# Patient Record
Sex: Female | Born: 1975 | Race: White | Hispanic: No | Marital: Married | Smoking: Never smoker
Health system: Southern US, Community
[De-identification: ages and names within clinical notes are randomized; demographics above are authoritative.]

## PROBLEM LIST (undated history)

## (undated) DIAGNOSIS — E119 Type 2 diabetes mellitus without complications: Secondary | ICD-10-CM

## (undated) DIAGNOSIS — N979 Female infertility, unspecified: Secondary | ICD-10-CM

## (undated) DIAGNOSIS — K219 Gastro-esophageal reflux disease without esophagitis: Secondary | ICD-10-CM

## (undated) DIAGNOSIS — K589 Irritable bowel syndrome without diarrhea: Secondary | ICD-10-CM

## (undated) DIAGNOSIS — F32A Depression, unspecified: Secondary | ICD-10-CM

## (undated) DIAGNOSIS — E7800 Pure hypercholesterolemia, unspecified: Secondary | ICD-10-CM

## (undated) DIAGNOSIS — K921 Melena: Secondary | ICD-10-CM

## (undated) DIAGNOSIS — K635 Polyp of colon: Secondary | ICD-10-CM

## (undated) DIAGNOSIS — F329 Major depressive disorder, single episode, unspecified: Secondary | ICD-10-CM

## (undated) DIAGNOSIS — K8020 Calculus of gallbladder without cholecystitis without obstruction: Secondary | ICD-10-CM

## (undated) DIAGNOSIS — F419 Anxiety disorder, unspecified: Secondary | ICD-10-CM

## (undated) DIAGNOSIS — E079 Disorder of thyroid, unspecified: Secondary | ICD-10-CM

## (undated) DIAGNOSIS — E669 Obesity, unspecified: Secondary | ICD-10-CM

## (undated) HISTORY — PX: CYST EXCISION: SHX5701

## (undated) HISTORY — DX: Gastro-esophageal reflux disease without esophagitis: K21.9

## (undated) HISTORY — DX: Melena: K92.1

## (undated) HISTORY — DX: Pure hypercholesterolemia, unspecified: E78.00

## (undated) HISTORY — DX: Obesity, unspecified: E66.9

## (undated) HISTORY — DX: Irritable bowel syndrome, unspecified: K58.9

## (undated) HISTORY — DX: Female infertility, unspecified: N97.9

## (undated) HISTORY — DX: Polyp of colon: K63.5

## (undated) HISTORY — DX: Calculus of gallbladder without cholecystitis without obstruction: K80.20

## (undated) HISTORY — DX: Anxiety disorder, unspecified: F41.9

## (undated) HISTORY — PX: GLOMUS TUMOR EXCISION: SUR521

## (undated) HISTORY — DX: Type 2 diabetes mellitus without complications: E11.9

## (undated) HISTORY — DX: Depression, unspecified: F32.A

## (undated) HISTORY — DX: Major depressive disorder, single episode, unspecified: F32.9

---

## 2002-01-25 DIAGNOSIS — E079 Disorder of thyroid, unspecified: Secondary | ICD-10-CM

## 2002-01-25 HISTORY — DX: Disorder of thyroid, unspecified: E07.9

## 2003-01-26 ENCOUNTER — Emergency Department (HOSPITAL_COMMUNITY): Admission: AD | Admit: 2003-01-26 | Discharge: 2003-01-27 | Payer: Self-pay | Admitting: Emergency Medicine

## 2004-01-26 HISTORY — PX: CHOLECYSTECTOMY: SHX55

## 2006-05-12 ENCOUNTER — Ambulatory Visit (HOSPITAL_COMMUNITY): Admission: RE | Admit: 2006-05-12 | Discharge: 2006-05-12 | Payer: Self-pay | Admitting: Gynecology

## 2012-09-17 ENCOUNTER — Encounter (HOSPITAL_COMMUNITY): Payer: Self-pay | Admitting: Emergency Medicine

## 2012-09-17 ENCOUNTER — Emergency Department (INDEPENDENT_AMBULATORY_CARE_PROVIDER_SITE_OTHER)
Admission: EM | Admit: 2012-09-17 | Discharge: 2012-09-17 | Disposition: A | Discharge status: Home / Self Care | Payer: BC Managed Care – PPO | Source: Home / Self Care | Attending: Emergency Medicine | Admitting: Emergency Medicine

## 2012-09-17 DIAGNOSIS — J329 Chronic sinusitis, unspecified: Secondary | ICD-10-CM

## 2012-09-17 HISTORY — DX: Disorder of thyroid, unspecified: E07.9

## 2012-09-17 MED ORDER — AMOXICILLIN-POT CLAVULANATE 875-125 MG PO TABS: 1.0000 | ORAL_TABLET | Freq: Two times a day (BID) | ORAL | Status: DC

## 2012-09-17 MED ORDER — HYDROCODONE-ACETAMINOPHEN 5-325 MG PO TABS: 2.0000 | ORAL_TABLET | ORAL | Status: DC | PRN

## 2012-09-17 NOTE — ED Provider Notes (Signed)
Medical screening examination/treatment/procedure(s) were performed by non-physician practitioner and as supervising physician I was immediately available for consultation/collaboration.  Seymone Forlenza, M.D.  Jyair Kiraly C Victoria Euceda, MD 09/17/12 1439 

## 2012-09-17 NOTE — ED Provider Notes (Signed)
Medical screening examination/treatment/procedure(s) were performed by non-physician practitioner and as supervising physician I was immediately available for consultation/collaboration.  Shenee Wignall, M.D.  Namrata Dangler C Terron Merfeld, MD 09/17/12 1948 

## 2012-09-17 NOTE — ED Provider Notes (Addendum)
  CSN: [id number]     Arrival date & time 09/17/12  1237 History     First MD Initiated Contact with Patient 09/17/12 1434     Chief Complaint  Patient presents with  . URI   (Consider location/radiation/quality/duration/timing/severity/associated sxs/prior Treatment) Patient is a 37 y.o. female presenting with URI. The history is provided by the patient. No language interpreter was used.  URI Presenting symptoms: congestion, cough, ear pain, facial pain, fever, rhinorrhea and sore throat   Severity:  Moderate Onset quality:  Gradual Timing:  Constant Progression:  Worsening Chronicity:  New Relieved by:  Nothing Worsened by:  Nothing tried Ineffective treatments:  None tried Pt complains of a cough and congestion.  Pt complains of pain in right forehead  Past Medical History  Diagnosis Date  . Thyroid disease    Past Surgical History  Procedure Laterality Date  . Cholecystectomy    . Cyst excision     No family history on file. History  Substance Use Topics  . Smoking status: Never Smoker   . Smokeless tobacco: Not on file  . Alcohol Use: Yes   OB History   Grav Para Term Preterm Abortions TAB SAB Ect Mult Living                 Review of Systems  Constitutional: Positive for fever.  HENT: Positive for ear pain, congestion, sore throat and rhinorrhea.   Respiratory: Positive for cough.   All other systems reviewed and are negative.    Allergies  Review of patient's allergies indicates no known allergies.  Home Medications  No current outpatient prescriptions on file. BP 140/96  Pulse 96  Temp(Src) 98.8 F (37.1 C) (Oral)  Resp 18  SpO2 94%  LMP 09/10/2012 Physical Exam  Nursing note and vitals reviewed. Constitutional: She is oriented to person, place, and time. She appears well-developed and well-nourished.  HENT:  Head: Normocephalic and atraumatic.  Right Ear: External ear normal.  Left Ear: External ear normal.  Tender right frontal sinus   Eyes: Conjunctivae and EOM are normal. Pupils are equal, round, and reactive to light.  Neck: Normal range of motion. Neck supple.  Cardiovascular: Normal rate and normal heart sounds.   Pulmonary/Chest: Effort normal and breath sounds normal.  Abdominal: Soft.  Musculoskeletal: Normal range of motion.  Neurological: She is alert and oriented to person, place, and time. She has normal reflexes.  Skin: Skin is warm.  Psychiatric: She has a normal mood and affect.    ED Course   Procedures (including critical care time)  Labs Reviewed - No data to display No results found. 1. Sinusitis     MDM  Augmentin and Hydrocodone   Elson Areas, PA-C 09/17/12 1438  Lonia Skinner [place], [place] 09/17/12 1441

## 2012-09-17 NOTE — ED Notes (Signed)
Patient reports cold symptoms onset Thursday, but right side of head pain and neck pain and right ear pain onset yesterday.  Patient is tearful .

## 2012-09-20 ENCOUNTER — Encounter (HOSPITAL_COMMUNITY): Payer: Self-pay | Admitting: *Deleted

## 2012-09-20 ENCOUNTER — Emergency Department (HOSPITAL_COMMUNITY): Payer: BC Managed Care – PPO

## 2012-09-20 ENCOUNTER — Encounter (HOSPITAL_COMMUNITY): Payer: Self-pay | Admitting: Emergency Medicine

## 2012-09-20 ENCOUNTER — Emergency Department (HOSPITAL_COMMUNITY)
Admission: EM | Admit: 2012-09-20 | Discharge: 2012-09-20 | Disposition: A | Discharge status: Home / Self Care | Payer: BC Managed Care – PPO | Attending: Emergency Medicine | Admitting: Emergency Medicine

## 2012-09-20 ENCOUNTER — Emergency Department (INDEPENDENT_AMBULATORY_CARE_PROVIDER_SITE_OTHER)
Admission: EM | Admit: 2012-09-20 | Discharge: 2012-09-20 | Disposition: A | Discharge status: Nursing Home (Includes ICF) | Payer: BC Managed Care – PPO | Source: Home / Self Care

## 2012-09-20 DIAGNOSIS — R590 Localized enlarged lymph nodes: Secondary | ICD-10-CM

## 2012-09-20 DIAGNOSIS — R252 Cramp and spasm: Secondary | ICD-10-CM

## 2012-09-20 DIAGNOSIS — R110 Nausea: Secondary | ICD-10-CM | POA: Insufficient documentation

## 2012-09-20 DIAGNOSIS — J3489 Other specified disorders of nose and nasal sinuses: Secondary | ICD-10-CM | POA: Insufficient documentation

## 2012-09-20 DIAGNOSIS — R259 Unspecified abnormal involuntary movements: Secondary | ICD-10-CM

## 2012-09-20 DIAGNOSIS — H9209 Otalgia, unspecified ear: Secondary | ICD-10-CM | POA: Insufficient documentation

## 2012-09-20 DIAGNOSIS — M542 Cervicalgia: Secondary | ICD-10-CM | POA: Insufficient documentation

## 2012-09-20 DIAGNOSIS — R6884 Jaw pain: Secondary | ICD-10-CM | POA: Insufficient documentation

## 2012-09-20 DIAGNOSIS — J329 Chronic sinusitis, unspecified: Secondary | ICD-10-CM

## 2012-09-20 DIAGNOSIS — R599 Enlarged lymph nodes, unspecified: Secondary | ICD-10-CM | POA: Insufficient documentation

## 2012-09-20 DIAGNOSIS — R51 Headache: Secondary | ICD-10-CM | POA: Insufficient documentation

## 2012-09-20 LAB — POCT I-STAT, CHEM 8
BUN: 6 mg/dL (ref 6–23)
Calcium, Ion: 1.17 mmol/L (ref 1.12–1.23)
Chloride: 103 mEq/L (ref 96–112)
Creatinine, Ser: 0.8 mg/dL (ref 0.50–1.10)
Glucose, Bld: 115 mg/dL — ABNORMAL HIGH (ref 70–99)
HCT: 39 % (ref 36.0–46.0)
Hemoglobin: 13.3 g/dL (ref 12.0–15.0)
Potassium: 4 mEq/L (ref 3.5–5.1)
Sodium: 139 mEq/L (ref 135–145)
TCO2: 26 mmol/L (ref 0–100)

## 2012-09-20 LAB — CBC WITH DIFFERENTIAL/PLATELET
Basophils Absolute: 0 10*3/uL (ref 0.0–0.1)
Basophils Relative: 0 % (ref 0–1)
Eosinophils Absolute: 0.1 10*3/uL (ref 0.0–0.7)
Eosinophils Relative: 1 % (ref 0–5)
HCT: 37.3 % (ref 36.0–46.0)
Hemoglobin: 12.8 g/dL (ref 12.0–15.0)
Lymphocytes Relative: 14 % (ref 12–46)
Lymphs Abs: 1.2 10*3/uL (ref 0.7–4.0)
MCH: 28.5 pg (ref 26.0–34.0)
MCHC: 34.3 g/dL (ref 30.0–36.0)
MCV: 83.1 fL (ref 78.0–100.0)
Monocytes Absolute: 0.4 10*3/uL (ref 0.1–1.0)
Monocytes Relative: 5 % (ref 3–12)
Neutro Abs: 7.1 10*3/uL (ref 1.7–7.7)
Neutrophils Relative %: 80 % — ABNORMAL HIGH (ref 43–77)
Platelets: 180 10*3/uL (ref 150–400)
RBC: 4.49 MIL/uL (ref 3.87–5.11)
RDW: 14.4 % (ref 11.5–15.5)
WBC: 8.8 10*3/uL (ref 4.0–10.5)

## 2012-09-20 MED ORDER — MORPHINE SULFATE 4 MG/ML IJ SOLN
4.0000 mg | Freq: Once | INTRAMUSCULAR | Status: AC
  Administered 2012-09-20: 4 mg via INTRAVENOUS
  Filled 2012-09-20: qty 1

## 2012-09-20 MED ORDER — ONDANSETRON HCL 4 MG/2ML IJ SOLN
4.0000 mg | Freq: Once | INTRAMUSCULAR | Status: AC
  Administered 2012-09-20: 4 mg via INTRAVENOUS
  Filled 2012-09-20: qty 2

## 2012-09-20 MED ORDER — GI COCKTAIL ~~LOC~~
30.0000 mL | Freq: Once | ORAL | Status: AC
  Administered 2012-09-20: 30 mL via ORAL
  Filled 2012-09-20: qty 30

## 2012-09-20 MED ORDER — TRAMADOL HCL 50 MG PO TABS: 50.0000 mg | ORAL_TABLET | Freq: Four times a day (QID) | ORAL | Status: DC | PRN

## 2012-09-20 MED ORDER — TRAMADOL HCL 50 MG PO TABS
50.0000 mg | ORAL_TABLET | Freq: Once | ORAL | Status: AC
  Administered 2012-09-20: 50 mg via ORAL
  Filled 2012-09-20: qty 1

## 2012-09-20 MED ORDER — IOHEXOL 300 MG/ML  SOLN
75.0000 mL | Freq: Once | INTRAMUSCULAR | Status: AC | PRN
  Administered 2012-09-20: 75 mL via INTRAVENOUS

## 2012-09-20 NOTE — ED Notes (Addendum)
Pt states she was here Sunday and diagnosed with a sinus infection. She is currently taking Augmentin and hydrocodone. Pt states she is worsening not getting better. Pt c/o fatigue and severe throat/neck pain x 3 days with sxs worsening. Jan Ranson, SMA

## 2012-09-20 NOTE — ED Notes (Signed)
Pt sent from urgent care for further evaluation for sore throat, states she was seen and treated for sinus infection, woke today with increased swelling in throat, sent here to r/o abscess in throat

## 2012-09-20 NOTE — ED Provider Notes (Signed)
Medical screening examination/treatment/procedure(s) were performed by non-physician practitioner and as supervising physician I was immediately available for consultation/collaboration.   Lakeysha Slutsky S Mirna Sutcliffe, MD 09/20/12 2003 

## 2012-09-20 NOTE — ED Provider Notes (Signed)
Melissa Gray is a 37 y.o. female who presents to Urgent Care today for trismus and right neck pain. Patient was seen on Sunday the 24th with sinusitis and nasal discharge.  She was diagnosed with a bacterial sinus infection and treated with Augmentin and hydrocodone. She notes however her symptoms have worsened since she was initially seen. She's developed significant pain in the right submandibular space and pain with opening her mouth and swallowing. She notes a subjective fever. She has been taking her medication. The hydrocodone does not help much.    PMH reviewed. Obese with hypothyroidism   History  Substance Use Topics  . Smoking status: Never Smoker   . Smokeless tobacco: Not on file  . Alcohol Use: Yes   ROS as above Medications reviewed. No current facility-administered medications for this encounter.   Current Outpatient Prescriptions  Medication Sig Dispense Refill  . amoxicillin-clavulanate (AUGMENTIN) 875-125 MG per tablet Take 1 tablet by mouth 2 (two) times daily.  20 tablet  0  . HYDROcodone-acetaminophen (NORCO/VICODIN) 5-325 MG per tablet Take 2 tablets by mouth every 4 (four) hours as needed for pain.  20 tablet  0    Exam:  BP 136/105  Pulse 105  Temp(Src) 99.1 F (37.3 C) (Oral)  Resp 14  SpO2 97%  LMP 09/10/2012 Gen: Well NAD HEENT: EOMI,  MMM, Pain with opening of the mouth.  TTP, and fullness right submandibular space. No right lower dental caries Lungs: CTABL Nl WOB Heart: RRR no MRG Abd: NABS, NT, ND Exts: Non edematous BL  LE, warm and well perfused.   No results found for this or any previous visit (from the past 24 hour(s)). No results found.  Assessment and Plan: 37 y.o. female with deep neck infection versus infected right submandibular lymph node. Spoke with Dr. Lazarus Salines who suggest transfer to the emergency room for CT scan. He'll be happy to consult on the patient if she does have a deep neck infection. Transfer via the shuttle for  further evaluation and management    Rodolph Bong, MD 09/20/12 [phone]

## 2012-09-20 NOTE — ED Notes (Signed)
Called CT, report that the pt is next in line.

## 2012-09-20 NOTE — ED Notes (Addendum)
Information entered by Daisy Lazar student.

## 2012-09-20 NOTE — ED Notes (Signed)
Patient is resting comfortably. 

## 2012-09-20 NOTE — ED Provider Notes (Signed)
CSN: [id number]     Arrival date & time 09/20/12  0906 History   First MD Initiated Contact with Patient 09/20/12 [phone]     Chief Complaint  Patient presents with  . Sore Throat   (Consider location/radiation/quality/duration/timing/severity/associated sxs/prior Treatment) HPI Comments: Patient is a 37 y/o female who was transferred today from urgent care for R anterior neck pain with trismus. Patient states symptoms have been worsening x 3 days. She was seen Sunday the 24th at Urgent care and dx with bacterial sinusitis and nasal discharge. She was treated with Augmentin and hydrocodone, however, her symptoms have worsened since she was initially seen. Patient endorsing a constant pain in her R anterior neck and R jaw. Pain is worse when she clenches her teeth and when she tries to open her throat wide. States her symptoms of sinus pressure, b/l ear pressure, and temporal headache have persisted since she was initially seen. Patient denies associated fevers, inability to swallow, drooling, oral lesions, oral bleeding, dental pain, shortness of breath, and posterior neck pain. Denies established relationship with PCP.  Patient is a 37 y.o. female presenting with pharyngitis. The history is provided by the patient. No language interpreter was used.  Sore Throat Associated symptoms include congestion, nausea (sporadic) and neck pain (anterior right). Pertinent negatives include no fever, numbness, sore throat, vomiting or weakness.    Past Medical History  Diagnosis Date  . Thyroid disease    Past Surgical History  Procedure Laterality Date  . Cholecystectomy    . Cyst excision     History reviewed. No pertinent family history. History  Substance Use Topics  . Smoking status: Never Smoker   . Smokeless tobacco: Not on file  . Alcohol Use: Yes   OB History   Grav Para Term Preterm Abortions TAB SAB Ect Mult Living                 Review of Systems  Constitutional: Negative for fever.   HENT: Positive for congestion, neck pain (anterior right) and sinus pressure. Negative for sore throat, drooling, trouble swallowing and ear discharge.        +trismus  Respiratory: Negative for shortness of breath.   Gastrointestinal: Positive for nausea (sporadic). Negative for vomiting.  Neurological: Negative for weakness and numbness.  All other systems reviewed and are negative.   Allergies  Review of patient's allergies indicates no known allergies.  Home Medications   Current Outpatient Rx  Name  Route  Sig  Dispense  Refill  . amoxicillin-clavulanate (AUGMENTIN) 875-125 MG per tablet   Oral   Take 1 tablet by mouth 2 (two) times daily.   20 tablet   0   . HYDROcodone-acetaminophen (NORCO/VICODIN) 5-325 MG per tablet   Oral   Take 2 tablets by mouth every 4 (four) hours as needed for pain.   20 tablet   0   . traMADol (ULTRAM) 50 MG tablet   Oral   Take 1 tablet (50 mg total) by mouth every 6 (six) hours as needed for pain.   15 tablet   0    BP 126/74  Pulse 87  Temp(Src) 98.2 F (36.8 C) (Oral)  Resp 20  Wt 250 lb (113.399 kg)  SpO2 94%  LMP 09/10/2012  Physical Exam  Nursing note and vitals reviewed. Constitutional: She is oriented to person, place, and time. She appears well-developed and well-nourished. No distress.  Morbidly obese female in NAD  HENT:  Head: Normocephalic and atraumatic.  Right Ear:  Tympanic membrane, external ear and ear canal normal. No mastoid tenderness.  Left Ear: Tympanic membrane, external ear and ear canal normal. No mastoid tenderness.  Nose: Nose normal.  Mouth/Throat: Uvula is midline and mucous membranes are normal. No oral lesions. There is trismus in the jaw. No edematous. No oropharyngeal exudate, posterior oropharyngeal edema or posterior oropharyngeal erythema.  Airway patent; No stridor  Eyes: Conjunctivae and EOM are normal. Pupils are equal, round, and reactive to light. No scleral icterus.  Neck: Normal range  of motion. Neck supple.  +TTP of R anterior cervical lymph nodes  Cardiovascular: Regular rhythm, normal heart sounds and intact distal pulses.   Tachycardic to 103  Pulmonary/Chest: Effort normal and breath sounds normal. No stridor. No respiratory distress. She has no wheezes. She has no rales.  Lymphadenopathy:    She has cervical adenopathy (R anterior cervical).  Neurological: She is alert and oriented to person, place, and time.  Skin: Skin is warm and dry. No rash noted. She is not diaphoretic. No erythema. No pallor.  Psychiatric: She has a normal mood and affect. Her behavior is normal.   ED Course  Procedures (including critical care time) Labs Review Labs Reviewed  CBC WITH DIFFERENTIAL - Abnormal; Notable for the following:    Neutrophils Relative % 80 (*)    All other components within normal limits  POCT I-STAT, CHEM 8 - Abnormal; Notable for the following:    Glucose, Bld 115 (*)    All other components within normal limits   Imaging Review Ct Soft Tissue Neck W Contrast  09/20/2012   *RADIOLOGY REPORT*  Clinical Data: Sore throat and low grade fever.  Right side neck swelling. Difficulty swallowing.  CT NECK WITH CONTRAST  Technique:  Multidetector CT imaging of the neck was performed with intravenous contrast.  Contrast: 75mL OMNIPAQUE IOHEXOL 300 MG/ML  SOLN  Comparison: None.  Findings: No abscess is identified.  No lymphadenopathy or mass is seen.  Major caliber vascular structures are patent.  Major salivary glands appear normal.  There are some scattered small lymph nodes in the neck.   One of the  more prominent lymph nodes on the right at level IIA and measures 1.2 cm.  Lung apices are clear.  The patient has dental disease with cavities most prominent in two maxillary molars on the left.  Visualized paranasal sinuses demonstrate mild mucosal thickening in the left maxillary mucous retention cyst or polyp in the right maxillary.  IMPRESSION:  1.  Negative for abscess.  2.  Small lymph nodes in the neck are likely reactive. 3.  Mild appearing bilateral maxillary sinus disease. 4.  Dental disease.   Original Report Authenticated By: Holley Dexter, M.D.   MDM   1. Sinusitis   2. Anterior cervical lymphadenopathy    Patient presents for worsening R anterior neck pain x 3 days. Transferred from urgent care for further evaluation of symptoms and CT scan. Patient well and nontoxic appearing and afebrile; hemodynamically stable. Physical exam findings as above. Labs without leukocytosis, anemia, and electrolyte imbalance. Kidney function preserved. CT neck with contrast significant for small lymph nodes and b/l sinusitis. No evidence of abscess or mass. Patient given Rx for Augmentin on 09/17/12; endorses full compliance with medication. Patient appropriate for d/c with PCP follow up and instruction to continue antibiotics as prescribed. Rx for tramadol given for pain control as patient states Norco made her drowsy and unable to work. Indications for ED return discussed and patient agreeable to plan  with no unaddressed concerns.   Antony Madura, PA-C 09/20/12 [phone]

## 2012-09-20 NOTE — ED Notes (Signed)
Pt returned from radiology.

## 2012-09-20 NOTE — ED Notes (Signed)
Pt c/o right sided throat/neck swelling along. Pt sts last Sunday she went to urgent care and was dx with a sinus infection, was given antibiotics and started taking them on Sunday, 2 X a day, augmented, last dose at 7am today. Reports she wasn't feeling any better and felt that she was actually getting worse so went back to urgent care this morning, the physician over there sent here her for further evaluation, thinking of possible abscess and needs an CT. Pt in extreme pain, reports she hasn't taken any pain meds this morning and hasn't eaten. Pt in nad, skin warm and dry, resp e/u. Speaking in full sentences, airway intact.

## 2012-09-26 ENCOUNTER — Ambulatory Visit: Payer: BC Managed Care – PPO | Attending: Internal Medicine | Admitting: Internal Medicine

## 2012-09-26 ENCOUNTER — Encounter: Payer: Self-pay | Admitting: Internal Medicine

## 2012-09-26 VITALS — BP 126/86 | HR 107 | Temp 99.2°F | Resp 16 | Ht 66.0 in | Wt 267.0 lb

## 2012-09-26 DIAGNOSIS — R6884 Jaw pain: Secondary | ICD-10-CM | POA: Insufficient documentation

## 2012-09-26 DIAGNOSIS — J019 Acute sinusitis, unspecified: Secondary | ICD-10-CM

## 2012-09-26 DIAGNOSIS — K029 Dental caries, unspecified: Secondary | ICD-10-CM

## 2012-09-26 MED ORDER — TRAMADOL HCL 50 MG PO TABS: 50.0000 mg | ORAL_TABLET | Freq: Four times a day (QID) | ORAL | Status: DC | PRN

## 2012-09-26 MED ORDER — AMOXICILLIN-POT CLAVULANATE 875-125 MG PO TABS: 1.0000 | ORAL_TABLET | Freq: Two times a day (BID) | ORAL | Status: DC

## 2012-09-26 NOTE — Progress Notes (Signed)
Patient ID: Melissa Gray, female   DOB: 31-Aug-1975, 37 y.o.   MRN: [id number]  CC: To establish care  HPI: Patient is a 37 years old woman here to establish medical care. She was in the ER over the weekend for worsening jaw pain on the right. She had been seen earlier in the urgent care for signs and symptoms of sinusitis for which she was started on Augmentin, she later developed trismus and there is a she visited again. CT head and soft tissue neck was done, no significant findings except for reactive cervical lymphadenitis, no abscess. She was discharged from the ER with instructions to continue antibiotics and to followup with our clinic. She has no new complaints today, since being his legs, and she is able to open her mother to be better, no fever, no headache, no other symptoms. There is no significant past medical history    No Known Allergies Past Medical History  Diagnosis Date  . Thyroid disease    Current Outpatient Prescriptions on File Prior to Visit  Medication Sig Dispense Refill  . HYDROcodone-acetaminophen (NORCO/VICODIN) 5-325 MG per tablet Take 2 tablets by mouth every 4 (four) hours as needed for pain.  20 tablet  0   No current facility-administered medications on file prior to visit.   No family history on file. History   Social History  . Marital Status: Married    Spouse Name: N/A    Number of Children: N/A  . Years of Education: N/A   Occupational History  . Not on file.   Social History Main Topics  . Smoking status: Never Smoker   . Smokeless tobacco: Not on file  . Alcohol Use: Yes  . Drug Use: No  . Sexual Activity: Not on file   Other Topics Concern  . Not on file   Social History Narrative  . No narrative on file    Review of Systems: Constitutional: Negative for fever, chills, diaphoresis, activity change, appetite change and fatigue. HENT: Negative for ear pain, nosebleeds, congestion, facial swelling, rhinorrhea, neck pain, neck  stiffness and ear discharge.  Eyes: Negative for pain, discharge, redness, itching and visual disturbance. Respiratory: Negative for cough, choking, chest tightness, shortness of breath, wheezing and stridor.  Cardiovascular: Negative for chest pain, palpitations and leg swelling. Gastrointestinal: Negative for abdominal distention. Genitourinary: Negative for dysuria, urgency, frequency, hematuria, flank pain, decreased urine volume, difficulty urinating and dyspareunia.  Musculoskeletal: Negative for back pain, joint swelling, arthralgias and gait problem. Neurological: Negative for dizziness, tremors, seizures, syncope, facial asymmetry, speech difficulty, weakness, light-headedness, numbness and headaches.  Hematological: Negative for adenopathy. Does not bruise/bleed easily. Psychiatric/Behavioral: Negative for hallucinations, behavioral problems, confusion, dysphoric mood, decreased concentration and agitation.    Objective:   Filed Vitals:   09/26/12 1117  BP: 126/86  Pulse: 107  Temp: 99.2 F (37.3 C)  Resp: 16    Physical Exam: Constitutional: Patient appears well-developed and well-nourished. No distress. Morbidly obese HENT: Normocephalic, atraumatic, External right and left ear normal. Oropharynx is clear and moist.  Eyes: Conjunctivae and EOM are normal. PERRLA, no scleral icterus. Neck: Normal ROM. Neck supple. No JVD. No tracheal deviation. No thyromegaly. CVS: RRR, S1/S2 +, no murmurs, no gallops, no carotid bruit.  Pulmonary: Effort and breath sounds normal, no stridor, rhonchi, wheezes, rales.  Abdominal: Soft. BS +,  no distension, tenderness, rebound or guarding.  Musculoskeletal: Normal range of motion. No edema and no tenderness.  Lymphadenopathy: No lymphadenopathy noted, cervical, inguinal or axillary Neuro:  Alert. Normal reflexes, muscle tone coordination. No cranial nerve deficit. Skin: Skin is warm and dry. No rash noted. Not diaphoretic. No erythema. No  pallor. Psychiatric: Normal mood and affect. Behavior, judgment, thought content normal.  Lab Results  Component Value Date   WBC 8.8 09/20/2012   HGB 13.3 09/20/2012   HCT 39.0 09/20/2012   MCV 83.1 09/20/2012   PLT 180 09/20/2012   Lab Results  Component Value Date   CREATININE 0.80 09/20/2012   BUN 6 09/20/2012   NA 139 09/20/2012   K 4.0 09/20/2012   CL 103 09/20/2012    No results found for this basename: HGBA1C   Lipid Panel  No results found for this basename: chol, trig, hdl, cholhdl, vldl, ldlcalc       Assessment and plan:   Patient Active Problem List   Diagnosis Date Noted  . Dental caries 09/26/2012  . Sinusitis, acute 09/26/2012   Augmentin tablet 875-125 mg by mouth twice a day for 4 more days to make a total of 14 days antibiotic treatment Tramadol 50 mg tablet by mouth 3 times a day when necessary pain Ambulatory referral to dentistry for dental caries Return to the clinic in 4 weeks for annual physical  Patient extensively counseled about nutrition and exercise  Melissa Gray was given clear instructions to go to ER or return to the clinic if symptoms don't improve, worsen or new problems develop.  Melissa Gray verbalized understanding.  Melissa Gray was told to call to get lab results if hasn't heard anything in the next week.        Jeanann Lewandowsky, MD [facility] [place], [place] [phone]   09/26/2012, 11:42 AM

## 2012-09-26 NOTE — Progress Notes (Signed)
Pt is here to establish care. She is following up after visit with the hospital. She had a a sinus infection 2 weeks ago and it got worse.  Her lymph nodes were very swollen and soar to the touch. She still has a persistent cough. Pain scale is a 5 when she touches her neck

## 2012-12-26 ENCOUNTER — Ambulatory Visit: Payer: BC Managed Care – PPO | Admitting: Internal Medicine

## 2015-06-03 ENCOUNTER — Encounter: Payer: Self-pay | Admitting: Family Medicine

## 2015-06-03 ENCOUNTER — Ambulatory Visit (INDEPENDENT_AMBULATORY_CARE_PROVIDER_SITE_OTHER): Payer: Managed Care, Other (non HMO) | Admitting: Family Medicine

## 2015-06-03 VITALS — BP 127/67 | HR 93 | Temp 98.5°F | Resp 20 | Ht 66.0 in | Wt 281.5 lb

## 2015-06-03 DIAGNOSIS — J0100 Acute maxillary sinusitis, unspecified: Secondary | ICD-10-CM | POA: Diagnosis not present

## 2015-06-03 DIAGNOSIS — G8929 Other chronic pain: Secondary | ICD-10-CM | POA: Diagnosis not present

## 2015-06-03 DIAGNOSIS — E079 Disorder of thyroid, unspecified: Secondary | ICD-10-CM | POA: Diagnosis not present

## 2015-06-03 DIAGNOSIS — N926 Irregular menstruation, unspecified: Secondary | ICD-10-CM | POA: Insufficient documentation

## 2015-06-03 DIAGNOSIS — R1013 Epigastric pain: Secondary | ICD-10-CM

## 2015-06-03 DIAGNOSIS — Z7189 Other specified counseling: Secondary | ICD-10-CM | POA: Diagnosis not present

## 2015-06-03 DIAGNOSIS — Z6841 Body Mass Index (BMI) 40.0 and over, adult: Secondary | ICD-10-CM | POA: Insufficient documentation

## 2015-06-03 DIAGNOSIS — Z7689 Persons encountering health services in other specified circumstances: Secondary | ICD-10-CM | POA: Insufficient documentation

## 2015-06-03 LAB — H. PYLORI ANTIBODY, IGG: H PYLORI IGG: NEGATIVE

## 2015-06-03 LAB — CBC WITH DIFFERENTIAL/PLATELET
BASOS ABS: 0.1 10*3/uL (ref 0.0–0.1)
Basophils Relative: 0.7 % (ref 0.0–3.0)
EOS ABS: 0.2 10*3/uL (ref 0.0–0.7)
Eosinophils Relative: 2.7 % (ref 0.0–5.0)
HEMATOCRIT: 39.6 % (ref 36.0–46.0)
HEMOGLOBIN: 13.2 g/dL (ref 12.0–15.0)
LYMPHS PCT: 27.2 % (ref 12.0–46.0)
Lymphs Abs: 2 10*3/uL (ref 0.7–4.0)
MCHC: 33.3 g/dL (ref 30.0–36.0)
MCV: 83.7 fl (ref 78.0–100.0)
MONOS PCT: 4.1 % (ref 3.0–12.0)
Monocytes Absolute: 0.3 10*3/uL (ref 0.1–1.0)
NEUTROS ABS: 4.9 10*3/uL (ref 1.4–7.7)
Neutrophils Relative %: 65.3 % (ref 43.0–77.0)
PLATELETS: 207 10*3/uL (ref 150.0–400.0)
RBC: 4.74 Mil/uL (ref 3.87–5.11)
RDW: 16.5 % — ABNORMAL HIGH (ref 11.5–15.5)
WBC: 7.4 10*3/uL (ref 4.0–10.5)

## 2015-06-03 LAB — T4, FREE: FREE T4: 0.88 ng/dL (ref 0.60–1.60)

## 2015-06-03 LAB — TSH: TSH: 3.82 u[IU]/mL (ref 0.35–4.50)

## 2015-06-03 MED ORDER — AMOXICILLIN-POT CLAVULANATE 875-125 MG PO TABS: 1.0000 | ORAL_TABLET | Freq: Two times a day (BID) | ORAL | Status: DC

## 2015-06-03 NOTE — Progress Notes (Addendum)
Patient ID: Shauntea Lok, female   DOB: 1975-05-01, 40 y.o.   MRN: [id number]      Patient ID: Vikkie Goeden, female  DOB: 08-27-1975, 40 y.o.   MRN: [id number]  Subjective:  Jodeen Mclin is a 40 y.o. female present for establishment of care. All past medical history, surgical history, allergies, family history, immunizations, medications and social history were obtained and entered in the electronic medical record today. All recent labs, ED visits and hospitalizations within the last year were reviewed.  Thyroid disease: Patient reports history of thyroid disease. Patient has been seen by endocrinologist for "awhile". She states her "numbers " were borderline for a thyroid disorder and he had started Synthroid. She was taking about ~50 mcg a day ("112 mcg cut in half daily was perfect "). She states it had been a few years since she's been on any medications.  Irregular menses: Patient reports irregular menstrual periods that her becoming more irregular than prior. She states that her menstrual cycle is unpredictable, appears at different cycle intervals, lasts different length of time between spotting in 14 days. 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 No records are available.  Epigastric pain: Patient reports a history of GERD in which she takes Zantac on occasions. She states she's never been formally diagnosed with reflux. She states she will vomit approximately every morning for the past 6 months, of green bile-like material. She admits that if she vomits hard, she sometimes see history of "bright red blood "at the end of her vomit. She does not follow a special diet. She denies any unintentional weight loss, night sweats or fever.   Sinus infection: Patient states she thinks she has a sinus infection for approximately 10 days. She feels increased pressure on the right side of her face. She reports getting frequent sinus infections in the past and this feels  similar to that. She denies any fever but uncertain if she's had any chills. She has daily nausea and vomit in the morning for the past 6 months that she is uncertain if she's had any increased and this with her current symptoms. She endorses mild rhinorrhea and nasal congestion. She endorses bilateral facial pressure. She is not taking any medications.   Health maintenance:  Colonoscopy: No FHX, routine screening at age 36 Mammogram: [place] present and thinks her [place] aunt was positive for BRCA?; would screen early with baseline with CPE Cervical cancer screening:2015, no abnl pap. Wants to do PAP with CPE. Immunizations:  2014 tdap, flu UTD Infectious disease screening: HIV nad Hep C completed 2007 DEXA: never Assistive device: None  Oxygen use: None  Patient does not have a  Dental home. Hospitalizations/ED visits: None   Past Medical History  Diagnosis Date  . GERD (gastroesophageal reflux disease)   . Chicken pox   . Thyroid disease    Allergies  Allergen Reactions  . Tape Other (See Comments)    blisters  . Levothyroxine Rash    Generic brand only caused rash   Past Surgical History  Procedure Laterality Date  . Cyst excision    . 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 Cholecystectomy  2006   Family History  Problem Relation Age of Onset  . Diabetes Mother   . COPD Father   . Heart disease Father   . Early death Father   . Diabetes Brother   . Alcohol abuse Maternal Aunt   . Cancer Maternal Aunt   .  Alzheimer's disease Maternal Aunt   . Diabetes Maternal Grandmother   . Hearing loss Maternal Grandmother   . Early death Maternal Grandfather   . Cancer Maternal Grandfather   . Cancer Paternal Grandmother   . Heart disease Paternal Grandfather    Social History   Social History  . Marital Status: Married    Spouse Name: N/A  . Number of Children: N/A  . Years of Education: N/A   Occupational History  . Not on file.   Social History Main  Topics  . Smoking status: Never Smoker   . Smokeless tobacco: Never Used  . Alcohol Use: 0.0 oz/week    0 Standard drinks or equivalent per week  . Drug Use: No  . Sexual Activity:    Partners: Male    Birth Control/ Protection: None   Other Topics Concern  . Not on file   Social History Narrative   Married. Husband's name is Audelia Acton. No children.   2 pets.   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   Drinks caffeine   Wears her seatbelt   Smoke detector in home   Safe in her relationship.    ROS: Negative, with the exception of above mentioned in HPI  Objective: BP 127/67 mmHg  Pulse 93  Temp(Src) 98.5 F (36.9 C) (Oral)  Resp 20  Ht 5' 6"  (1.676 m)  Wt 281 lb 8 oz (127.688 kg)  BMI 45.46 kg/m2  SpO2 95%  LMP 05/19/2015 Gen: Afebrile. No acute distress. Nontoxic in appearance, well-developed, well-nourished, female, Pleasant HENT: AT. [place]. Bilateral TM visualized, with air-fluid levels bilaterally, no erythema, no bulging of tympanic membrane, normal external auditory canal. MMM, no oral lesions.  Bilateral nares with erythema and swelling. Throat without erythema, ulcerations or exudates. Mild Cough on exam, no hoarseness on exam. Tender palpation bilateral maxillary sinuses. Eyes:Pupils Equal Round Reactive to light, Extraocular movements intact,  Conjunctiva without redness, discharge or icterus. Neck/lymp/endocrine: Supple, Thick neck, ? Lymphadenopathy patient tender cervical anterior, Diffuse thyromegaly CV: RRR no murmur appreciated, +1 edema bilaterally, +2/4 P posterior tibialis pulses. Chest: CTAB, no wheeze, rhonchi or crackles.  Abd: Soft. Round. NTND. BS present.  Skin: No rashes, purpura or petechiae.  Neuro/Msk:  Normal gait. PERLA. EOMi. Alert. Oriented x3.  Psych: Normal affect, dress and demeanor. Normal speech. Normal thought content and judgment.  Assessment/plan: Kelsay Haggard is a 40 y.o. female present for establishment of  care. Acute maxillary sinusitis, recurrence not specified - Flonase, plus/minus antihistamine, Mucinex - amoxicillin-clavulanate (AUGMENTIN) 875-125 MG tablet; Take 1 tablet by mouth 2 (two) times daily.  Dispense: 20 tablet; Refill: 0  Irregular menstrual cycle/thyroid disease - Irregular menses has been long-standing by patient's history, sounds like she is even seen infertility and endocrine in the past. No records are available at this time. Will collect thyroid panel  Abdominal pain, chronic, epigastric - Possibly secondary to reflux versus ulcer versus H. pylori infection. Discussed with patient will perform H. pylori lab today since we are collecting other labs, however she will need to follow-up for further evaluation on this issue. - H. pylori antibody, IgG, CBC  BMI 45.0-49.9, adult (HCC)/Morbid obesity, unspecified obesity type ([place]) - TSH/T4 free. Patient not counseled on diet and exercise at this time is is an establishment visit. Patient encouraged to make a complete physical within 3 months.  Establishing care with new doctor, encounter for Colonoscopy: No FHX, routine screening at age 53 Mammogram: [place] present and thinks her [place] aunt was positive  for BRCA?; would screen early with baseline with CPE Cervical cancer screening:2015, no abnl pap. Wants to do PAP with CPE. Immunizations:  2014 tdap, flu UTD Infectious disease screening: HIV nad Hep C completed 2007 DEXA: never Assistive device: None  Oxygen use: None  Patient does not have a  Dental home. Hospitalizations/ED visits: None     CPE with PAP within 3 Soon[place] if irregularities and labs  Greater than 45 minutes was spent with patient, greater than 50% of that time was spent face-to-face with patient counseling and coordinating care.  Electronically signed by: Howard Pouch, DO Time

## 2015-06-03 NOTE — Patient Instructions (Addendum)
Start Flonase, use over-the-counter antihistamine if desired. Augmentin has been prescribed for sinusitis. Thyroid panel and H. pylori collected,  follow-up to discuss results if any abnormalities. physical within 3 months.

## 2015-06-04 ENCOUNTER — Telehealth: Payer: Self-pay | Admitting: Family Medicine

## 2015-06-04 DIAGNOSIS — R1013 Epigastric pain: Secondary | ICD-10-CM

## 2015-06-04 DIAGNOSIS — Z13220 Encounter for screening for lipoid disorders: Secondary | ICD-10-CM

## 2015-06-04 DIAGNOSIS — G8929 Other chronic pain: Secondary | ICD-10-CM

## 2015-06-04 DIAGNOSIS — Z131 Encounter for screening for diabetes mellitus: Secondary | ICD-10-CM

## 2015-06-04 DIAGNOSIS — N926 Irregular menstruation, unspecified: Secondary | ICD-10-CM

## 2015-06-04 DIAGNOSIS — Z6841 Body Mass Index (BMI) 40.0 and over, adult: Secondary | ICD-10-CM

## 2015-06-04 NOTE — Telephone Encounter (Signed)
Spoke with patient reviewed lab results and information about future orders. Patient verbalized understanding.

## 2015-06-04 NOTE — Telephone Encounter (Signed)
Please call patient, her lab work is all normal.  We can review labs in more detail at her upcoming appointment in June. (FYI: I did place future labs for that appt)

## 2015-06-27 ENCOUNTER — Other Ambulatory Visit (INDEPENDENT_AMBULATORY_CARE_PROVIDER_SITE_OTHER): Payer: Managed Care, Other (non HMO)

## 2015-06-27 DIAGNOSIS — R1013 Epigastric pain: Secondary | ICD-10-CM | POA: Diagnosis not present

## 2015-06-27 DIAGNOSIS — R7989 Other specified abnormal findings of blood chemistry: Secondary | ICD-10-CM | POA: Diagnosis not present

## 2015-06-27 DIAGNOSIS — Z13220 Encounter for screening for lipoid disorders: Secondary | ICD-10-CM

## 2015-06-27 DIAGNOSIS — N926 Irregular menstruation, unspecified: Secondary | ICD-10-CM | POA: Diagnosis not present

## 2015-06-27 DIAGNOSIS — Z131 Encounter for screening for diabetes mellitus: Secondary | ICD-10-CM

## 2015-06-27 DIAGNOSIS — Z6841 Body Mass Index (BMI) 40.0 and over, adult: Secondary | ICD-10-CM

## 2015-06-27 DIAGNOSIS — G8929 Other chronic pain: Secondary | ICD-10-CM

## 2015-06-27 LAB — COMPREHENSIVE METABOLIC PANEL
ALK PHOS: 80 U/L (ref 39–117)
ALT: 59 U/L — ABNORMAL HIGH (ref 0–35)
AST: 36 U/L (ref 0–37)
Albumin: 4.1 g/dL (ref 3.5–5.2)
BUN: 10 mg/dL (ref 6–23)
CALCIUM: 9.3 mg/dL (ref 8.4–10.5)
CO2: 29 mEq/L (ref 19–32)
Chloride: 102 mEq/L (ref 96–112)
Creatinine, Ser: 0.67 mg/dL (ref 0.40–1.20)
GFR: 104.92 mL/min (ref >60.00)
GLUCOSE: 217 mg/dL — AB (ref 70–99)
POTASSIUM: 3.8 meq/L (ref 3.5–5.1)
Sodium: 140 mEq/L (ref 135–145)
TOTAL PROTEIN: 6.7 g/dL (ref 6.0–8.3)
Total Bilirubin: 0.7 mg/dL (ref 0.2–1.2)

## 2015-06-27 LAB — LIPID PANEL
CHOLESTEROL: 201 mg/dL — AB (ref 0–200)
HDL: 27.9 mg/dL — AB (ref >39.00)
NONHDL: 173.41
TRIGLYCERIDES: 269 mg/dL — AB (ref 0.0–149.0)
Total CHOL/HDL Ratio: 7
VLDL: 53.8 mg/dL — ABNORMAL HIGH (ref 0.0–40.0)

## 2015-06-27 LAB — LIPASE: LIPASE: 28 U/L (ref 11.0–59.0)

## 2015-06-27 LAB — LDL CHOLESTEROL, DIRECT: LDL DIRECT: 122 mg/dL

## 2015-06-27 LAB — HEMOGLOBIN A1C: HEMOGLOBIN A1C: 7.5 % — AB (ref 4.6–6.5)

## 2015-06-30 ENCOUNTER — Telehealth: Payer: Self-pay | Admitting: Family Medicine

## 2015-06-30 NOTE — Telephone Encounter (Signed)
Pt has abnormal screening labs: - Appt in 2 days to review.  - Low HDL, elevated triglycerides. FHx heart disease, new diabetic.  - new diabetic by a1c, will need diabetes education, metformin start.

## 2015-07-02 ENCOUNTER — Ambulatory Visit (INDEPENDENT_AMBULATORY_CARE_PROVIDER_SITE_OTHER): Payer: Managed Care, Other (non HMO) | Admitting: Family Medicine

## 2015-07-02 ENCOUNTER — Encounter: Payer: Self-pay | Admitting: Family Medicine

## 2015-07-02 ENCOUNTER — Other Ambulatory Visit (HOSPITAL_COMMUNITY)
Admission: RE | Admit: 2015-07-02 | Discharge: 2015-07-02 | Disposition: A | Discharge status: Home / Self Care | Payer: Managed Care, Other (non HMO) | Source: Ambulatory Visit | Attending: Family Medicine | Admitting: Family Medicine

## 2015-07-02 VITALS — BP 130/84 | HR 88 | Temp 97.7°F | Resp 20 | Ht 66.0 in | Wt 278.5 lb

## 2015-07-02 DIAGNOSIS — E119 Type 2 diabetes mellitus without complications: Secondary | ICD-10-CM | POA: Insufficient documentation

## 2015-07-02 DIAGNOSIS — Z6841 Body Mass Index (BMI) 40.0 and over, adult: Secondary | ICD-10-CM | POA: Diagnosis not present

## 2015-07-02 DIAGNOSIS — K644 Residual hemorrhoidal skin tags: Secondary | ICD-10-CM | POA: Insufficient documentation

## 2015-07-02 DIAGNOSIS — Z01411 Encounter for gynecological examination (general) (routine) with abnormal findings: Secondary | ICD-10-CM | POA: Insufficient documentation

## 2015-07-02 DIAGNOSIS — Z1231 Encounter for screening mammogram for malignant neoplasm of breast: Secondary | ICD-10-CM

## 2015-07-02 DIAGNOSIS — L989 Disorder of the skin and subcutaneous tissue, unspecified: Secondary | ICD-10-CM

## 2015-07-02 DIAGNOSIS — Z1151 Encounter for screening for human papillomavirus (HPV): Secondary | ICD-10-CM | POA: Diagnosis present

## 2015-07-02 DIAGNOSIS — E786 Lipoprotein deficiency: Secondary | ICD-10-CM | POA: Insufficient documentation

## 2015-07-02 DIAGNOSIS — K648 Other hemorrhoids: Secondary | ICD-10-CM

## 2015-07-02 DIAGNOSIS — Z0001 Encounter for general adult medical examination with abnormal findings: Secondary | ICD-10-CM | POA: Diagnosis not present

## 2015-07-02 DIAGNOSIS — Z23 Encounter for immunization: Secondary | ICD-10-CM | POA: Diagnosis not present

## 2015-07-02 DIAGNOSIS — Z8481 Family history of carrier of genetic disease: Secondary | ICD-10-CM | POA: Insufficient documentation

## 2015-07-02 DIAGNOSIS — E781 Pure hyperglyceridemia: Secondary | ICD-10-CM | POA: Insufficient documentation

## 2015-07-02 DIAGNOSIS — Z124 Encounter for screening for malignant neoplasm of cervix: Secondary | ICD-10-CM

## 2015-07-02 DIAGNOSIS — Z1239 Encounter for other screening for malignant neoplasm of breast: Secondary | ICD-10-CM | POA: Insufficient documentation

## 2015-07-02 MED ORDER — METFORMIN HCL 500 MG PO TABS: ORAL_TABLET | ORAL | Status: DC

## 2015-07-02 NOTE — Patient Instructions (Signed)
I will order your baseline mammogram today, they will call you to set up. New diabetes diagnosis--> start metformin taper and I will need to see how you are in a month. With diabetes you are recommended to have Pneumonia series, one goven today, one in 12 months and then every 5 years repeat series. Start fish oil 3g (3048m) daily to help lower triglycerides  and elevate good cholesterol. --> recheck in 6 months I will refer you to a specialist to discuss your hemorrhoids.    Diabetes and Exercise Exercising regularly is important. It is not just about losing weight. It has many health benefits, such as:  Improving your overall fitness, flexibility, and endurance.  Increasing your bone density.  Helping with weight control.  Decreasing your body fat.  Increasing your muscle strength.  Reducing stress and tension.  Improving your overall health. People with diabetes who exercise gain additional benefits because exercise:  Reduces appetite.  Improves the body's use of blood sugar (glucose).  Helps lower or control blood glucose.  Decreases blood pressure.  Helps control blood lipids (such as cholesterol and triglycerides).  Improves the body's use of the hormone insulin by:  Increasing the body's insulin sensitivity.  Reducing the body's insulin needs.  Decreases the risk for heart disease because exercising:  Lowers cholesterol and triglycerides levels.  Increases the levels of good cholesterol (such as high-density lipoproteins [HDL]) in the body.  Lowers blood glucose levels. YOUR ACTIVITY PLAN  Choose an activity that you enjoy, and set realistic goals. To exercise safely, you should begin practicing any new physical activity slowly, and gradually increase the intensity of the exercise over time. Your health care provider or diabetes educator can help create an activity plan that works for you. General recommendations include:  Encouraging children to engage in at  least 60 minutes of physical activity each day.  Stretching and performing strength training exercises, such as yoga or weight lifting, at least 2 times per week.  Performing a total of at least 150 minutes of moderate-intensity exercise each week, such as brisk walking or water aerobics.  Exercising at least 3 days per week, making sure you allow no more than 2 consecutive days to pass without exercising.  Avoiding long periods of inactivity (90 minutes or more). When you have to spend an extended period of time sitting down, take frequent breaks to walk or stretch. RECOMMENDATIONS FOR EXERCISING WITH TYPE 1 OR TYPE 2 DIABETES   Check your blood glucose before exercising. If blood glucose levels are greater than 240 mg/dL, check for urine ketones. Do not exercise if ketones are present.  Avoid injecting insulin into areas of the body that are going to be exercised. For example, avoid injecting insulin into:  The arms when playing tennis.  The legs when jogging.  Keep a record of:  Food intake before and after you exercise.  Expected peak times of insulin action.  Blood glucose levels before and after you exercise.  The type and amount of exercise you have done.  Review your records with your health care provider. Your health care provider will help you to develop guidelines for adjusting food intake and insulin amounts before and after exercising.  If you take insulin or oral hypoglycemic agents, watch for signs and symptoms of hypoglycemia. They include:  Dizziness.  Shaking.  Sweating.  Chills.  Confusion.  Drink plenty of water while you exercise to prevent dehydration or heat stroke. Body water is lost during exercise and must  be replaced.  Talk to your health care provider before starting an exercise program to make sure it is safe for you. Remember, almost any type of activity is better than none.   This information is not intended to replace advice given to you  by your health care provider. Make sure you discuss any questions you have with your health care provider.   Document Released: 04/03/2003 Document Revised: 05/28/2014 Document Reviewed: 06/20/2012 Elsevier Interactive Patient Education 2016 [employer].  HDL:You could bring this up with eating foods such as olive oil, salmon, whole grains, beans/legumes and exercise.  Food Choices to Lower Your Triglycerides Triglycerides are a type of fat in your blood. High levels of triglycerides can increase the risk of heart disease and stroke. If your triglyceride levels are high, the foods you eat and your eating habits are very important. Choosing the right foods can help lower your triglycerides.  WHAT GENERAL GUIDELINES DO I NEED TO FOLLOW?  Lose weight if you are overweight.   Limit or avoid alcohol.   Fill one half of your plate with vegetables and green salads.   Limit fruit to two servings a day. Choose fruit instead of juice.   Make one fourth of your plate whole grains. Look for the word "whole" as the first word in the ingredient list.  Fill one fourth of your plate with lean protein foods.  Enjoy fatty fish (such as salmon, mackerel, sardines, and tuna) three times a week.   Choose healthy fats.   Limit foods high in starch and sugar.  Eat more home-cooked food and less restaurant, buffet, and fast food.  Limit fried foods.  Cook foods using methods other than frying.  Limit saturated fats.  Check ingredient lists to avoid foods with partially hydrogenated oils (trans fats) in them. WHAT FOODS CAN I EAT?  Grains Whole grains, such as whole wheat or whole grain breads, crackers, cereals, and pasta. Unsweetened oatmeal, bulgur, barley, quinoa, or brown rice. Corn or whole wheat flour tortillas.  Vegetables Fresh or frozen vegetables (raw, steamed, roasted, or grilled). Green salads. Fruits All fresh, canned (in natural juice), or frozen fruits. Meat and Other  Protein Products Ground beef (85% or leaner), grass-fed beef, or beef trimmed of fat. Skinless chicken or [place]. Ground chicken or [place]. Pork trimmed of fat. All fish and seafood. Eggs. Dried beans, peas, or lentils. Unsalted nuts or seeds. Unsalted canned or dry beans. Dairy Low-fat dairy products, such as skim or 1% milk, 2% or reduced-fat cheeses, low-fat ricotta or cottage cheese, or plain low-fat yogurt. Fats and Oils Tub margarines without trans fats. Light or reduced-fat mayonnaise and salad dressings. Avocado. Safflower, olive, or canola oils. Natural peanut or almond butter. The items listed above may not be a complete list of recommended foods or beverages. Contact your dietitian for more options. WHAT FOODS ARE NOT RECOMMENDED?  Grains White bread. White pasta. White rice. Cornbread. Bagels, pastries, and croissants. Crackers that contain trans fat. Vegetables White potatoes. Corn. Creamed or fried vegetables. Vegetables in a cheese sauce. Fruits Dried fruits. Canned fruit in light or heavy syrup. Fruit juice. Meat and Other Protein Products Fatty cuts of meat. Ribs, chicken wings, bacon, sausage, bologna, salami, chitterlings, fatback, hot dogs, bratwurst, and packaged luncheon meats. Dairy Whole or 2% milk, cream, half-and-half, and cream cheese. Whole-fat or sweetened yogurt. Full-fat cheeses. Nondairy creamers and whipped toppings. Processed cheese, cheese spreads, or cheese curds. Sweets and Desserts Corn syrup, sugars, honey, and molasses. Candy. Jam  and jelly. Syrup. Sweetened cereals. Cookies, pies, cakes, donuts, muffins, and ice cream. Fats and Oils Butter, stick margarine, lard, shortening, ghee, or bacon fat. Coconut, palm kernel, or palm oils. Beverages Alcohol. Sweetened drinks (such as sodas, lemonade, and fruit drinks or punches). The items listed above may not be a complete list of foods and beverages to avoid. Contact your dietitian for more information.     This information is not intended to replace advice given to you by your health care provider. Make sure you discuss any questions you have with your health care provider.   Document Released: 10/30/2003 Document Revised: 02/01/2014 Document Reviewed: 11/15/2012 Elsevier Interactive Patient Education 2016 [place] Maintenance, Female Adopting a healthy lifestyle and getting preventive care can go a long way to promote health and wellness. Talk with your health care provider about what schedule of regular examinations is right for you. This is a good chance for you to check in with your provider about disease prevention and staying healthy. In between checkups, there are plenty of things you can do on your own. Experts have done a lot of research about which lifestyle changes and preventive measures are most likely to keep you healthy. Ask your health care provider for more information. WEIGHT AND DIET  Eat a healthy diet  Be sure to include plenty of vegetables, fruits, low-fat dairy products, and lean protein.  Do not eat a lot of foods high in solid fats, added sugars, or salt.  Get regular exercise. This is one of the most important things you can do for your health.  Most adults should exercise for at least 150 minutes each week. The exercise should increase your heart rate and make you sweat (moderate-intensity exercise).  Most adults should also do strengthening exercises at least twice a week. This is in addition to the moderate-intensity exercise.  Maintain a healthy weight  Body mass index (BMI) is a measurement that can be used to identify possible weight problems. It estimates body fat based on height and weight. Your health care provider can help determine your BMI and help you achieve or maintain a healthy weight.  For females 24 years of age and older:   A BMI below 18.5 is considered underweight.  A BMI of 18.5 to 24.9 is normal.  A BMI of 25 to 29.9 is  considered overweight.  A BMI of 30 and above is considered obese.  Watch levels of cholesterol and blood lipids  You should start having your blood tested for lipids and cholesterol at 38 years of age, then have this test every 5 years.  You may need to have your cholesterol levels checked more often if:  Your lipid or cholesterol levels are high.  You are older than 38 years of age.  You are at high risk for heart disease.  CANCER SCREENING   Lung Cancer  Lung cancer screening is recommended for adults 46-37 years old who are at high risk for lung cancer because of a history of smoking.  A yearly low-dose CT scan of the lungs is recommended for people who:  Currently smoke.  Have quit within the past 15 years.  Have at least a 30-pack-year history of smoking. A pack year is smoking an average of one pack of cigarettes a day for 1 year.  Yearly screening should continue until it has been 15 years since you quit.  Yearly screening should stop if you develop a health problem that would prevent you  from having lung cancer treatment.  Breast Cancer  Practice breast self-awareness. This means understanding how your breasts normally appear and feel.  It also means doing regular breast self-exams. Let your health care provider know about any changes, no matter how small.  If you are in your 20s or 30s, you should have a clinical breast exam (CBE) by a health care provider every 1-3 years as part of a regular health exam.  If you are 28 or older, have a CBE every year. Also consider having a breast X-ray (mammogram) every year.  If you have a family history of breast cancer, talk to your health care provider about genetic screening.  If you are at high risk for breast cancer, talk to your health care provider about having an MRI and a mammogram every year.  Breast cancer gene (BRCA) assessment is recommended for women who have family members with BRCA-related cancers.  BRCA-related cancers include:  Breast.  Ovarian.  Tubal.  Peritoneal cancers.  Results of the assessment will determine the need for genetic counseling and BRCA1 and BRCA2 testing. Cervical Cancer Your health care provider may recommend that you be screened regularly for cancer of the pelvic organs (ovaries, uterus, and vagina). This screening involves a pelvic examination, including checking for microscopic changes to the surface of your cervix (Pap test). You may be encouraged to have this screening done every 3 years, beginning at age 102.  For women ages 50-65, health care providers may recommend pelvic exams and Pap testing every 3 years, or they may recommend the Pap and pelvic exam, combined with testing for human papilloma virus (HPV), every 5 years. Some types of HPV increase your risk of cervical cancer. Testing for HPV may also be done on women of any age with unclear Pap test results.  Other health care providers may not recommend any screening for nonpregnant women who are considered low risk for pelvic cancer and who do not have symptoms. Ask your health care provider if a screening pelvic exam is right for you.  If you have had past treatment for cervical cancer or a condition that could lead to cancer, you need Pap tests and screening for cancer for at least 20 years after your treatment. If Pap tests have been discontinued, your risk factors (such as having a new sexual partner) need to be reassessed to determine if screening should resume. Some women have medical problems that increase the chance of getting cervical cancer. In these cases, your health care provider may recommend more frequent screening and Pap tests. Colorectal Cancer  This type of cancer can be detected and often prevented.  Routine colorectal cancer screening usually begins at 38 years of age and continues through 38 years of age.  Your health care provider may recommend screening at an earlier age if you  have risk factors for colon cancer.  Your health care provider may also recommend using home test kits to check for hidden blood in the stool.  A small camera at the end of a tube can be used to examine your colon directly (sigmoidoscopy or colonoscopy). This is done to check for the earliest forms of colorectal cancer.  Routine screening usually begins at age 28.  Direct examination of the colon should be repeated every 5-10 years through 38 years of age. However, you may need to be screened more often if early forms of precancerous polyps or small growths are found. Skin Cancer  Check your skin from head to toe  regularly.  Tell your health care provider about any new moles or changes in moles, especially if there is a change in a mole's shape or color.  Also tell your health care provider if you have a mole that is larger than the size of a pencil eraser.  Always use sunscreen. Apply sunscreen liberally and repeatedly throughout the day.  Protect yourself by wearing long sleeves, pants, a wide-brimmed hat, and sunglasses whenever you are outside. HEART DISEASE, DIABETES, AND HIGH BLOOD PRESSURE   High blood pressure causes heart disease and increases the risk of stroke. High blood pressure is more likely to develop in:  People who have blood pressure in the high end of the normal range (130-139/85-89 mm Hg).  People who are overweight or obese.  People who are African American.  If you are 62-82 years of age, have your blood pressure checked every 3-5 years. If you are 22 years of age or older, have your blood pressure checked every year. You should have your blood pressure measured twice--once when you are at a hospital or clinic, and once when you are not at a hospital or clinic. Record the average of the two measurements. To check your blood pressure when you are not at a hospital or clinic, you can use:  An automated blood pressure machine at a pharmacy.  A home blood pressure  monitor.  If you are between 64 years and 92 years old, ask your health care provider if you should take aspirin to prevent strokes.  Have regular diabetes screenings. This involves taking a blood sample to check your fasting blood sugar level.  If you are at a normal weight and have a low risk for diabetes, have this test once every three years after 38 years of age.  If you are overweight and have a high risk for diabetes, consider being tested at a younger age or more often. PREVENTING INFECTION  Hepatitis B  If you have a higher risk for hepatitis B, you should be screened for this virus. You are considered at high risk for hepatitis B if:  You were born in a country where hepatitis B is common. Ask your health care provider which countries are considered high risk.  Your parents were born in a high-risk country, and you have not been immunized against hepatitis B (hepatitis B vaccine).  You have HIV or AIDS.  You use needles to inject street drugs.  You live with someone who has hepatitis B.  You have had sex with someone who has hepatitis B.  You get hemodialysis treatment.  You take certain medicines for conditions, including cancer, organ transplantation, and autoimmune conditions. Hepatitis C  Blood testing is recommended for:  Everyone born from [birth year] through 1963.  Anyone with known risk factors for hepatitis C. Sexually transmitted infections (STIs)  You should be screened for sexually transmitted infections (STIs) including gonorrhea and chlamydia if:  You are sexually active and are younger than 38 years of age.  You are older than 38 years of age and your health care provider tells you that you are at risk for this type of infection.  Your sexual activity has changed since you were last screened and you are at an increased risk for chlamydia or gonorrhea. Ask your health care provider if you are at risk.  If you do not have HIV, but are at risk, it may be  recommended that you take a prescription medicine daily to prevent HIV infection. This is  called pre-exposure prophylaxis (PrEP). You are considered at risk if:  You are sexually active and do not regularly use condoms or know the HIV status of your partner(s).  You take drugs by injection.  You are sexually active with a partner who has HIV. Talk with your health care provider about whether you are at high risk of being infected with HIV. If you choose to begin PrEP, you should first be tested for HIV. You should then be tested every 3 months for as long as you are taking PrEP.  PREGNANCY   If you are premenopausal and you may become pregnant, ask your health care provider about preconception counseling.  If you may become pregnant, take 400 to 800 micrograms (mcg) of folic acid every day.  If you want to prevent pregnancy, talk to your health care provider about birth control (contraception). OSTEOPOROSIS AND MENOPAUSE   Osteoporosis is a disease in which the bones lose minerals and strength with aging. This can result in serious bone fractures. Your risk for osteoporosis can be identified using a bone density scan.  If you are 5 years of age or older, or if you are at risk for osteoporosis and fractures, ask your health care provider if you should be screened.  Ask your health care provider whether you should take a calcium or vitamin D supplement to lower your risk for osteoporosis.  Menopause may have certain physical symptoms and risks.  Hormone replacement therapy may reduce some of these symptoms and risks. Talk to your health care provider about whether hormone replacement therapy is right for you.  HOME CARE INSTRUCTIONS   Schedule regular health, dental, and eye exams.  Stay current with your immunizations.   Do not use any tobacco products including cigarettes, chewing tobacco, or electronic cigarettes.  If you are pregnant, do not drink alcohol.  If you are  breastfeeding, limit how much and how often you drink alcohol.  Limit alcohol intake to no more than 1 drink per day for nonpregnant women. One drink equals 12 ounces of beer, 5 ounces of wine, or 1 ounces of hard liquor.  Do not use street drugs.  Do not share needles.  Ask your health care provider for help if you need support or information about quitting drugs.  Tell your health care provider if you often feel depressed.  Tell your health care provider if you have ever been abused or do not feel safe at home.   This information is not intended to replace advice given to you by your health care provider. Make sure you discuss any questions you have with your health care provider.   Document Released: 07/27/2010 Document Revised: 02/01/2014 Document Reviewed: 12/13/2012 Elsevier Interactive Patient Education [employer].

## 2015-07-02 NOTE — Progress Notes (Signed)
Patient ID: Melissa Gray, female   DOB: 10-24-1975, 40 y.o.   MRN: [id number]      Patient ID: Melissa Gray, female  DOB: [birth year], 40 y.o.   MRN: [id number]  Subjective:  Melissa Gray is a 40 y.o. female present for CPE with PAP today, with concerns about left shoulder skin lesion and hemorrhoids. All past medical history, surgical history, allergies, family history, immunizations, medications and social history were updated in the electronic medical record today. All recent labs, ED visits and hospitalizations within the last year were reviewed.  All preventive labs were discussed in detail. Pt is found to be a new diabetic with a1c 7.5 and fasting glucose >200. She has elevated triglycerides and low HDL.  Skin lesion: left shoulder skin lesion of at least 1 year duration. Patient reports lesion has never completely healed and is frequently catching on her bra strap.   Hemorrhoids: pt reports difficulty regulating her bowels. She has had hemorrhoids for a long time and had frequent burning, itching and bleeding. She denies fever, chills, current bleeding or drainage. She states they are becoming so bad hygiene is becoming difficult.     Well women: Patient reports irregular menses her entire life, causing infertility issues.She states that her menstrual cycle is unpredictable, appears at different cycle intervals, lasts different length of time between spotting in 14 days. 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 No records are available. She has noticed an increase in vaginal discharge. No irritation. No dyspareunia.    Health maintenance:  Colonoscopy: No FHX, routine screening at age 40 Mammogram: [place] present aunt was positive for BRCA--> baseline screen today.   Cervical cancer screening:2015, no abnl pap. Completed today.  Immunizations: 2014 tdap, flu UTD. PNA series started today for DM.  Infectious disease screening: HIV nad Hep C completed  2007 DEXA: routine screen at 30.  Assistive device: None  Oxygen use: None  Patient needs a dental home, she has anxiety surrounding dentist.  Hospitalizations/ED visits: None   Depression screen [facility] 2/9 07/02/2015 06/03/2015  Decreased Interest 0 2  Down, Depressed, Hopeless 0 1  PHQ - 2 Score 0 3  Altered sleeping 2 2  Tired, decreased energy 2 2  Change in appetite 0 2  Feeling bad or failure about yourself  0 2  Trouble concentrating 1 1  Moving slowly or fidgety/restless 0 0  Suicidal thoughts 0 0  PHQ-9 Score 5 12  Difficult doing work/chores Somewhat difficult Somewhat difficult    Current Exercise Habits: The patient does not participate in regular exercise at present    Fall Risk  07/02/2015  Falls in the past year? No    Past Medical History  Diagnosis Date  . GERD (gastroesophageal reflux disease)   . Chicken pox   . Thyroid disease    Allergies  Allergen Reactions  . Tape Other (See Comments)    blisters  . Levothyroxine Rash    Generic brand only caused rash   Past Surgical History  Procedure Laterality Date  . Cyst excision    . 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 Cholecystectomy  2006   Family History  Problem Relation Age of Onset  . Diabetes Mother   . COPD Father   . Heart disease Father   . Early death Father   . Diabetes Brother   . Alcohol abuse Maternal Aunt   . Cancer Maternal Aunt   . Alzheimer's disease  Maternal Aunt   . Diabetes Maternal Grandmother   . Hearing loss Maternal Grandmother   . Early death Maternal Grandfather   . Cancer Maternal Grandfather   . Cancer Paternal Grandmother   . Heart disease Paternal Grandfather    Social History   Social History  . Marital Status: Married    Spouse Name: N/A  . Number of Children: N/A  . Years of Education: N/A   Occupational History  . Not on file.   Social History Main Topics  . Smoking status: Never Smoker   . Smokeless tobacco: Never Used  .  Alcohol Use: 0.0 oz/week    0 Standard drinks or equivalent per week  . Drug Use: No  . Sexual Activity:    Partners: Male    Birth Control/ Protection: None   Other Topics Concern  . Not on file   Social History Narrative   Married. Husband's name is Audelia Acton. No children.   2 pets.   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   Drinks caffeine   Wears her seatbelt   Smoke detector in home   Safe in her relationship.   Recent Results (from the past 2160 hour(s))  CBC w/Diff     Status: Abnormal   Collection Time: 06/03/15 10:42 AM  Result Value Ref Range   WBC 7.4 4.0 - 10.5 K/uL   RBC 4.74 3.87 - 5.11 Mil/uL   Hemoglobin 13.2 12.0 - 15.0 g/dL   HCT 39.6 36.0 - 46.0 %   MCV 83.7 78.0 - 100.0 fl   MCHC 33.3 30.0 - 36.0 g/dL   RDW 16.5 (H) 11.5 - 15.5 %   Platelets 207.0 150.0 - 400.0 K/uL   Neutrophils Relative % 65.3 43.0 - 77.0 %   Lymphocytes Relative 27.2 12.0 - 46.0 %   Monocytes Relative 4.1 3.0 - 12.0 %   Eosinophils Relative 2.7 0.0 - 5.0 %   Basophils Relative 0.7 0.0 - 3.0 %   Neutro Abs 4.9 1.4 - 7.7 K/uL   Lymphs Abs 2.0 0.7 - 4.0 K/uL   Monocytes Absolute 0.3 0.1 - 1.0 K/uL   Eosinophils Absolute 0.2 0.0 - 0.7 K/uL   Basophils Absolute 0.1 0.0 - 0.1 K/uL  TSH     Status: None   Collection Time: 06/03/15 10:42 AM  Result Value Ref Range   TSH 3.82 0.35 - 4.50 uIU/mL  T4, free     Status: None   Collection Time: 06/03/15 10:42 AM  Result Value Ref Range   Free T4 0.88 0.60 - 1.60 ng/dL  H. pylori antibody, IgG     Status: None   Collection Time: 06/03/15 10:42 AM  Result Value Ref Range   H Pylori IgG Negative Negative  Lipid panel     Status: Abnormal   Collection Time: 06/27/15  8:02 AM  Result Value Ref Range   Cholesterol 201 (H) 0 - 200 mg/dL    Comment: ATP III Classification       Desirable:  < 200 mg/dL               Borderline High:  200 - 239 mg/dL          High:  > = 240 mg/dL   Triglycerides 269.0 (H) 0.0 - 149.0 mg/dL     Comment: Normal:  <150 mg/dLBorderline High:  150 - 199 mg/dL   HDL 27.90 (L) >39.00 mg/dL   VLDL 53.8 (H) 0.0 - 40.0 mg/dL   Total CHOL/HDL  Ratio 7     Comment:                Men          Women1/2 Average Risk     3.4          3.3Average Risk          5.0          4.42X Average Risk          9.6          7.13X Average Risk          15.0          11.0                       NonHDL 173.41     Comment: NOTE:  Non-HDL goal should be 30 mg/dL higher than patient's LDL goal (i.e. LDL goal of < 70 mg/dL, would have non-HDL goal of < 100 mg/dL)  Comprehensive metabolic panel     Status: Abnormal   Collection Time: 06/27/15  8:02 AM  Result Value Ref Range   Sodium 140 135 - 145 mEq/L   Potassium 3.8 3.5 - 5.1 mEq/L   Chloride 102 96 - 112 mEq/L   CO2 29 19 - 32 mEq/L   Glucose, Bld 217 (H) 70 - 99 mg/dL   BUN 10 6 - 23 mg/dL   Creatinine, Ser 0.67 0.40 - 1.20 mg/dL   Total Bilirubin 0.7 0.2 - 1.2 mg/dL   Alkaline Phosphatase 80 39 - 117 U/L   AST 36 0 - 37 U/L   ALT 59 (H) 0 - 35 U/L   Total Protein 6.7 6.0 - 8.3 g/dL   Albumin 4.1 3.5 - 5.2 g/dL   Calcium 9.3 8.4 - 10.5 mg/dL   GFR 104.92 >60.00 mL/min  HgB A1c     Status: Abnormal   Collection Time: 06/27/15  8:02 AM  Result Value Ref Range   Hgb A1c MFr Bld 7.5 (H) 4.6 - 6.5 %    Comment: Glycemic Control Guidelines for People with Diabetes:Non Diabetic:  <6%Goal of Therapy: <7%Additional Action Suggested:  >8%   Lipase     Status: None   Collection Time: 06/27/15  8:02 AM  Result Value Ref Range   Lipase 28.0 11.0 - 59.0 U/L  LDL cholesterol, direct     Status: None   Collection Time: 06/27/15  8:02 AM  Result Value Ref Range   Direct LDL 122.0 mg/dL    Comment: Optimal:  <100 mg/dLNear or Above Optimal:  100-129 mg/dLBorderline High:  130-159 mg/dLHigh:  160-189 mg/dLVery High:  >190 mg/dL     Medication List       This list is accurate as of: 07/02/15 12:57 PM.  Always use your most recent med list.                ibuprofen 400 MG tablet  Commonly known as:  ADVIL,MOTRIN  Take 400 mg by mouth every 6 (six) hours as needed for pain.     metFORMIN 500 MG tablet  Commonly known as:  GLUCOPHAGE  500 mg BID for 2 weeks, then increase to 1000 mg BID        ROS: Negative, with the exception of above mentioned in HPI  Objective: BP 130/84 mmHg  Pulse 88  Temp(Src) 97.7 F (36.5 C) (Oral)  Resp 20  Ht 5' 6" (1.676 m)  Wt 278 lb 8  oz (126.327 kg)  BMI 44.97 kg/m2  SpO2 95%  LMP 06/21/2015 Gen: Afebrile. No acute distress. Nontoxic in appearance, well-developed, well-nourished, female, obese, quiet. HENT: AT. [place]. Bilateral TM visualized and normal in appearance, normal external auditory canal. MMM, no oral lesions, adequate dentition. Bilateral nares without erythema or swelling. Throat without erythema, ulcerations or exudates. No Cough on exam, No  hoarseness on exam. Eyes:Pupils Equal Round Reactive to light, Extraocular movements intact,  Conjunctiva without redness, discharge or icterus. Neck/lymp/endocrine: Supple,No lymphadenopathy, No thyromegaly CV: RRR, no murmur appreciated, +1 edema, +2/4 P posterior tibialis pulses. Chest: CTAB, no wheeze, rhonchi or crackles. Normal  Respiratory effort. Good Air movement. Abd: Soft.Obese. NTND. BS present. No Masses palpated. No hepatosplenomegaly. No rebound tenderness or guarding. Skin: no rashes, purpura or petechiae. Warm and well-perfused. Skin intact. 1cm x 0.75cm hyperpigmented lesion, with raised poriton left shoulder. Neuro/Msk: Normal gait. PERLA. EOMi. Alert. Oriented x3.  Cranial nerves II through XII intact. Muscle strength 5/5 ue/le extremity. DTRs equal bilaterally. Psych: Normal affect, dress and demeanor. Normal speech. Normal thought content and judgment. Breasts: breasts appear normal, symmetrical, no tenderness on exam, no suspicious masses, no skin or nipple changes or axillary nodes. GYN/GU:  External genitalia within normal limits,  normal hair distribution, no lesions. Urethral meatus normal, no lesions. Vaginal mucosa pink, moist, normal rugae, no lesions. No cystocele or rectocele. cervix without lesions, mild thick white discharge. Bimanual exam revealed normal uterus.  No bladder/suprapubic fullness, masses or tenderness. No cervical motion tenderness. No adnexal fullness. Anus and perineum within normal limits, with the exception of multiple external hemorrhoids surrounding entire anus. No thrombosis, bleeding or drainage.  Diabetic Foot Exam - Simple   Simple Foot Form  Diabetic Foot exam was performed with the following findings:  Yes 07/02/2015  9:31 AM  Visual Inspection  No deformities, no ulcerations, no other skin breakdown bilaterally:  Yes  Sensation Testing  Intact to touch and monofilament testing bilaterally:  Yes  Pulse Check  Posterior Tibialis and Dorsalis pulse intact bilaterally:  Yes  Comments      Assessment/plan: Melissa Gray is a 40 y.o. female present for annual exam/CPE Pap smear for cervical cancer screening - Cytology - PAP with HPV  Need for vaccination with 13-polyvalent pneumococcal conjugate vaccine - new DM, start series today.  - 1 year PSV23 will be due.  - Pneumococcal conjugate vaccine 13-valent  Newly diagnosed diabetes ([place]) - discussed dx in detail with patient.Husband also newly diagnosed with DM. She is attending nutrition and DM classes with him and making dietary modifications/ exercise changes.  - Eye exam UTD.  - Foot exam completed today.  - PNA series started today with Prevnar.  - BMP q 6-12 mos.  - yearly microalbumin will be needed - Start metformin taper and f/u 1 month, then q3 months.  - metFORMIN (GLUCOPHAGE) 500 MG tablet; 500 mg BID for 2 weeks, then increase to 1000 mg BID  Dispense: 84 tablet; Refill: 0  Morbid obesity, unspecified obesity type (HCC)/ BMI 45.0-49.9, adult (HCC) Diet and exercise counseling, PREP program information  provided. Attending Nutrition classes.   Hypertriglyceridemia/ Low HDL (under 40) - start fish oil 3g daily. AVS on increasing HDl and lowering triglycerides provided.  - retest in 6 months, if elevated at that time will discuss statin use.  Skin lesion of left arm/shoulder - poss skin cancer vs dermatofibroma. Given location will send to Dermatology for eval - Ambulatory referral to Dermatology  External bleeding hemorrhoids -  Hygiene has become an issue. She reports frequent discomfort and bleeding associated. Currently not bleeding or infected. She would like to peruse removal.  - Ambulatory referral to General Surgery  Breast cancer screening, high risk patient/FH: BRCA gene positive - FHX BRCA/breast cancer - MM DIGITAL SCREENING BILATERAL; Future  Encounter for preventative adult health care exam with abnormal findings Patient was encouraged to exercise greater than 150 minutes a week. Patient was encouraged to choose a diet filled with fresh fruits and vegetables, and lean meats. AVS provided to patient today for education/recommendation on gender specific health and safety maintenance.  Return in about 4 weeks (around 07/30/2015) for Diabetes.  Electronically signed by: Howard Pouch, DO [place]

## 2015-07-03 LAB — CYTOLOGY - PAP

## 2015-07-07 ENCOUNTER — Telehealth: Payer: Self-pay | Admitting: Family Medicine

## 2015-07-07 NOTE — Telephone Encounter (Signed)
Left detailed message on cell vm, okay per DPR.  

## 2015-07-07 NOTE — Telephone Encounter (Signed)
Wheeze, patient, her Pap is normal with negative HPV.

## 2015-08-04 ENCOUNTER — Ambulatory Visit: Payer: Managed Care, Other (non HMO) | Admitting: Family Medicine

## 2015-08-21 ENCOUNTER — Encounter: Payer: Self-pay | Admitting: Family Medicine

## 2015-08-21 ENCOUNTER — Ambulatory Visit (INDEPENDENT_AMBULATORY_CARE_PROVIDER_SITE_OTHER): Payer: Managed Care, Other (non HMO) | Admitting: Family Medicine

## 2015-08-21 DIAGNOSIS — E119 Type 2 diabetes mellitus without complications: Secondary | ICD-10-CM | POA: Diagnosis not present

## 2015-08-21 LAB — MICROALBUMIN / CREATININE URINE RATIO
Creatinine,U: 83.3 mg/dL
MICROALB/CREAT RATIO: 0.8 mg/g (ref 0.0–30.0)
Microalb, Ur: <0.7 mg/dL (ref 0.0–1.9)

## 2015-08-21 MED ORDER — METFORMIN HCL ER 500 MG PO TB24: ORAL_TABLET | ORAL | 2 refills | Status: DC

## 2015-08-21 NOTE — Progress Notes (Signed)
Patient ID: Melissa Gray, female   DOB: 1975/09/12, 40 y.o.   MRN: [id number]     Melissa Gray , 10-12-1975, 40 y.o., female MRN: [id number] Patient Care Team    Relationship Specialty Notifications Start End  Ma Hillock, DO PCP - General Family Medicine  06/03/15     CC: DM followup Subjective: Pt presents for an follow up OV for her new onset diabetes. She has been trying to take the metformin but has been experiencing diarrhea and nausea. She is not checking her BG. She is starting to make dietary changes, adding more fresh vegetables to her diet. She has not started the exercise or nutrition classes (ordered for husband initially for her new onset DM). She denies non-healing wounds, hyper/hypoglycemic events or numbness/tingling in her extremities.   Allergies  Allergen Reactions  . Tape Other (See Comments)    blisters  . Levothyroxine Rash    Generic brand only caused rash   Social History  Substance Use Topics  . Smoking status: Never Smoker  . Smokeless tobacco: Never Used  . Alcohol use 0.0 oz/week   Past Medical History:  Diagnosis Date  . Chicken pox   . Diabetes mellitus without complication ([place])   . GERD (gastroesophageal reflux disease)   . Thyroid disease    Past Surgical History:  Procedure Laterality Date  . CHOLECYSTECTOMY  2006  . CYST EXCISION    . GLOMUS TUMOR EXCISION  2005, 2012   finger and cheek   Family History  Problem Relation Age of Onset  . Diabetes Mother   . COPD Father   . Heart disease Father   . Early death Father   . Diabetes Brother   . Alcohol abuse Maternal Aunt   . Cancer Maternal Aunt   . Alzheimer's disease Maternal Aunt   . Diabetes Maternal Grandmother   . Hearing loss Maternal Grandmother   . Early death Maternal Grandfather   . Cancer Maternal Grandfather   . Cancer Paternal Grandmother   . Heart disease Paternal Grandfather      Medication List       Accurate as of 08/21/15  8:19 AM. Always use your  most recent med list.          fluticasone 50 MCG/ACT nasal spray Commonly known as:  FLONASE   ibuprofen 400 MG tablet Commonly known as:  ADVIL,MOTRIN Take 400 mg by mouth every 6 (six) hours as needed for pain.   metFORMIN 500 MG tablet Commonly known as:  GLUCOPHAGE 500 mg BID for 2 weeks, then increase to 1000 mg BID       No results found for this or any previous visit (from the past 24 hour(s)). No results found.   ROS: Negative, with the exception of above mentioned in HPI  Objective:  BP 133/86 (BP Location: Right Arm, Patient Position: Sitting, Cuff Size: Normal)   Pulse 93   Temp 98.1 F (36.7 C)   Resp 20   Ht 5\' 6"  (1.676 m)   Wt 272 lb 12 oz (123.7 kg)   LMP 08/07/2015   SpO2 95%   BMI 44.02 kg/m  Body mass index is 44.02 kg/m. Gen: Afebrile. No acute distress. Nontoxic in appearance, well developed, well nourished. Obese female.  HENT: AT. [place].MMM, no oral lesions. Eyes:Pupils Equal Round Reactive to light, Extraocular movements intact,  Conjunctiva without redness, discharge or icterus. Neck/lymp/endocrine: Supple,no lymphadenopathy CV: RRR, no edema, +2/4 DP/PT Chest: CTAB, no wheeze or crackles. Good air movement,  normal resp effort.  Abd: Soft. NTND. BS present.  Skin: no rashes, purpura or petechiae.  Neuro: Normal gait. PERLA. EOMi. Alert. Oriented x3   Assessment/Plan: Melissa Gray is a 40 y.o. female present for acute OV for  Morbid obesity, unspecified obesity type ([place]) - encouraged PREP program - diet/exercise counseled  Newly diagnosed diabetes ([place]) - Eye exam UTD 03/26/2015 --> requesting records from doctor.  - Foot exam completed 07/02/2015 - Prevnar administered 6/7//2017 - BMP q 6-12 mos.  - microalbumin collected today  - Start XR metformin taper. Not tolerating regular.  - metFORMIN (GLUCOPHAGE XR) 500 MG 24 hr tablet; 1 tab for 7 days, then 2 tabs (1000mg ) daily with meal  Dispense: 60 tablet; Refill: 2 - Urine  Microalbumin w/creat. ratio - Ambulatory referral to diabetic education - F/U after 9/4 for 3 month A1c/DM   > 25 minutes spent with patient, >50% of time spent face to face counseling patient and coordinating care.   electronically signed by:  Howard Pouch, DO  [place]

## 2015-08-21 NOTE — Patient Instructions (Signed)
nutrition referral placed.  Follow up after 09/28/2015

## 2015-08-22 ENCOUNTER — Telehealth: Payer: Self-pay | Admitting: Family Medicine

## 2015-08-22 NOTE — Telephone Encounter (Signed)
Please call pt: - her urine studies are normal

## 2015-08-22 NOTE — Telephone Encounter (Signed)
Spoke with patient reviewed lab results. 

## 2015-09-26 ENCOUNTER — Other Ambulatory Visit: Payer: Self-pay | Admitting: Family Medicine

## 2015-09-26 DIAGNOSIS — Z8481 Family history of carrier of genetic disease: Secondary | ICD-10-CM

## 2015-09-26 DIAGNOSIS — Z1231 Encounter for screening mammogram for malignant neoplasm of breast: Secondary | ICD-10-CM

## 2015-10-07 ENCOUNTER — Ambulatory Visit
Admission: RE | Admit: 2015-10-07 | Discharge: 2015-10-07 | Disposition: A | Discharge status: Home / Self Care | Payer: Managed Care, Other (non HMO) | Source: Ambulatory Visit | Attending: Family Medicine | Admitting: Family Medicine

## 2015-10-07 DIAGNOSIS — Z1231 Encounter for screening mammogram for malignant neoplasm of breast: Secondary | ICD-10-CM

## 2015-10-07 DIAGNOSIS — Z8481 Family history of carrier of genetic disease: Secondary | ICD-10-CM

## 2015-10-08 ENCOUNTER — Other Ambulatory Visit: Payer: Self-pay | Admitting: Family Medicine

## 2015-10-08 DIAGNOSIS — R928 Other abnormal and inconclusive findings on diagnostic imaging of breast: Secondary | ICD-10-CM

## 2015-10-09 ENCOUNTER — Ambulatory Visit: Payer: Managed Care, Other (non HMO) | Admitting: Family Medicine

## 2015-10-13 ENCOUNTER — Encounter: Payer: Self-pay | Admitting: Family Medicine

## 2015-10-13 DIAGNOSIS — R928 Other abnormal and inconclusive findings on diagnostic imaging of breast: Secondary | ICD-10-CM | POA: Insufficient documentation

## 2015-10-14 ENCOUNTER — Encounter: Payer: Self-pay | Admitting: Family Medicine

## 2015-10-14 ENCOUNTER — Ambulatory Visit (INDEPENDENT_AMBULATORY_CARE_PROVIDER_SITE_OTHER): Payer: Managed Care, Other (non HMO) | Admitting: Family Medicine

## 2015-10-14 VITALS — BP 126/88 | HR 92 | Temp 98.4°F | Resp 18 | Ht 66.0 in | Wt 269.5 lb

## 2015-10-14 DIAGNOSIS — E119 Type 2 diabetes mellitus without complications: Secondary | ICD-10-CM | POA: Diagnosis not present

## 2015-10-14 LAB — POCT GLYCOSYLATED HEMOGLOBIN (HGB A1C): HEMOGLOBIN A1C: 8.7

## 2015-10-14 NOTE — Progress Notes (Signed)
Patient ID: Melissa Gray, female   DOB: August 27, 1975, 40 y.o.   MRN: [id number]     Melissa Gray , 1976/01/21, 40 y.o., female MRN: [id number] Patient Care Team    Relationship Specialty Notifications Start End  Ma Hillock, DO PCP - General Family Medicine  06/03/15     CC: DM followup Subjective:  Melissa Gray is a 40 y.o.  Female, Pt presents for diabetes follow up today. She has not started XR metformin, thus has not been treating her diabetes since our last visit  3 months ago. Short acting metformin was causing her diarrhea and nausea. She is not monitoring her blood glucose levels as suggested. She has not attended the nutrition/diabetic counseling classes. She denies non healing wounds, numbness or tingling in her extremities. She denies hyperglycemic events/symptoms.   Abnormal mammogram: Pt has recently had an abnormal mammogram of her breast, with a fhx of breast cancer. She is undergoing breast US tomorrow. She is very nervous and concerned.    Prior note:  Pt presents for an follow up OV for her new onset diabetes. She has been trying to take the metformin but has been experiencing diarrhea and nausea. She is not checking her BG. She is starting to make dietary changes, adding more fresh vegetables to her diet. She has not started the exercise or nutrition classes (ordered for husband initially for her new onset DM). She denies non-healing wounds, hyper/hypoglycemic events or numbness/tingling in her extremities.   Allergies  Allergen Reactions  . Tape Other (See Comments)    blisters  . Levothyroxine Rash    Generic brand only caused rash   Social History  Substance Use Topics  . Smoking status: Never Smoker  . Smokeless tobacco: Never Used  . Alcohol use 0.0 oz/week   Past Medical History:  Diagnosis Date  . Chicken pox   . Diabetes mellitus without complication ([place])   . GERD (gastroesophageal reflux disease)   . Thyroid disease    Past Surgical  History:  Procedure Laterality Date  . CHOLECYSTECTOMY  2006  . CYST EXCISION    . GLOMUS TUMOR EXCISION  2005, 2012   finger and cheek   Family History  Problem Relation Age of Onset  . Diabetes Mother   . COPD Father   . Heart disease Father   . Early death Father   . Diabetes Brother   . Alcohol abuse Maternal Aunt   . Cancer Maternal Aunt   . Alzheimer's disease Maternal Aunt   . Diabetes Maternal Grandmother   . Hearing loss Maternal Grandmother   . Early death Maternal Grandfather   . Cancer Maternal Grandfather   . Cancer Paternal Grandmother   . Heart disease Paternal Grandfather      Medication List       Accurate as of 10/14/15  8:27 AM. Always use your most recent med list.          fluticasone 50 MCG/ACT nasal spray Commonly known as:  FLONASE   ibuprofen 400 MG tablet Commonly known as:  ADVIL,MOTRIN Take 400 mg by mouth every 6 (six) hours as needed for pain.   metFORMIN 500 MG 24 hr tablet Commonly known as:  GLUCOPHAGE XR 1 tab for 7 days, then 2 tabs (1000mg ) daily with meal       Results for orders placed or performed in visit on 10/14/15 (from the past 24 hour(s))  POCT glycosylated hemoglobin (Hb A1C)     Status: Abnormal   Collection  Time: 10/14/15  8:26 AM  Result Value Ref Range   Hemoglobin A1C 8.7    No results found.   ROS: Negative, with the exception of above mentioned in HPI  Objective:  BP 126/88 (BP Location: Right Arm, Patient Position: Sitting, Cuff Size: Large)   Pulse 92   Temp 98.4 F (36.9 C)   Resp 18   Ht 5\' 6"  (1.676 m)   Wt 269 lb 8 oz (122.2 kg)   BMI 43.50 kg/m  Body mass index is 43.5 kg/m. Gen: Afebrile. No acute distress. Nontoxic in appearance, well developed, well nourished. Obese female. Tearful today HENT: AT. [place].MMM, no oral lesions. Eyes:Pupils Equal Round Reactive to light, Extraocular movements intact,  Conjunctiva without redness, discharge or icterus. Neck/lymp/endocrine: Supple,no  lymphadenopathy CV: RRR, no edema, +2/4 DP/PT Chest: CTAB, no wheeze or crackles. Good air movement, normal resp effort.  Abd: Soft. NTND. BS present.  Skin: no rashes, purpura or petechiae.  Neuro: Normal gait. PERLA. EOMi. Alert. Oriented x3   Assessment/Plan: Melissa Gray is a 40 y.o. female present for acute OV for  Morbid obesity, unspecified obesity type ([place]) - encouraged PREP program - diet/exercise counseled  Newly diagnosed diabetes ([place]) - a1c 7.5--> 8.5 today - Eye exam UTD 03/26/2015  - Foot exam completed 07/02/2015 - Prevnar administered 6/7//2017 - BMP q 6-12 mos, completed 06/27/2015- normal kidney finction  - microalbumin collected 08/21/2015- normal - Start XR metformin taper. Not tolerating regular. Consider Amaryl if not tolerating XR.  - metFORMIN (GLUCOPHAGE XR) 500 MG 24 hr tablet; 1 tab for 7 days, then 2 tabs (1000mg ) daily with meal  Dispense: 60 tablet; Refill: 2 - Ambulatory referral to diabetic education--> has not attended, encouraged to attend.  - record fasting sugars: monitor and supplies given today (she was using husbands). - F/U 3 months  Abnormal mammogram: - Fhx breast cancer - [place] scheduled tomorrow.  - Discussed the need for better glucose control, especially if she were to need a surgical procedure.     > 25 minutes spent with patient, >50% of time spent face to face counseling patient and coordinating care.   electronically signed by:  Howard Pouch, DO  [place]

## 2015-10-14 NOTE — Patient Instructions (Addendum)
-   metFORMIN (GLUCOPHAGE XR) 500 MG 24 hr tablet; 1 tab for 7 days, then 2 tabs (1000mg ) daily with meal  Dispense: 60 tablet; Refill: 2 - diabetic diet - schedule nutritionist.  - If not tolerating new medication, please call in and we will switch it for you, no need to wait until 3 month follow up.  - monitor fasting sugars and record, bring them with you on your next appt.  - if all going well follow up in 3 months.

## 2015-10-15 ENCOUNTER — Ambulatory Visit
Admission: RE | Admit: 2015-10-15 | Discharge: 2015-10-15 | Disposition: A | Discharge status: Home / Self Care | Payer: Managed Care, Other (non HMO) | Source: Ambulatory Visit | Attending: Family Medicine | Admitting: Family Medicine

## 2015-10-15 DIAGNOSIS — R928 Other abnormal and inconclusive findings on diagnostic imaging of breast: Secondary | ICD-10-CM

## 2016-01-29 ENCOUNTER — Ambulatory Visit (INDEPENDENT_AMBULATORY_CARE_PROVIDER_SITE_OTHER): Payer: Managed Care, Other (non HMO) | Admitting: Family Medicine

## 2016-01-29 ENCOUNTER — Encounter: Payer: Self-pay | Admitting: Family Medicine

## 2016-01-29 ENCOUNTER — Telehealth: Payer: Self-pay | Admitting: Family Medicine

## 2016-01-29 VITALS — BP 127/85 | HR 91 | Temp 98.4°F | Resp 20 | Ht 66.0 in | Wt 263.2 lb

## 2016-01-29 DIAGNOSIS — E1169 Type 2 diabetes mellitus with other specified complication: Secondary | ICD-10-CM | POA: Insufficient documentation

## 2016-01-29 DIAGNOSIS — E1165 Type 2 diabetes mellitus with hyperglycemia: Secondary | ICD-10-CM | POA: Diagnosis not present

## 2016-01-29 DIAGNOSIS — IMO0001 Reserved for inherently not codable concepts without codable children: Secondary | ICD-10-CM

## 2016-01-29 LAB — POCT GLYCOSYLATED HEMOGLOBIN (HGB A1C): HEMOGLOBIN A1C: 10.1

## 2016-01-29 MED ORDER — SITAGLIPTIN PHOSPHATE 100 MG PO TABS: 100.0000 mg | ORAL_TABLET | Freq: Every day | ORAL | 3 refills | Status: DC

## 2016-01-29 NOTE — Progress Notes (Signed)
Patient ID: Melissa Gray, female   DOB: [birth year], 41 y.o.   MRN: [id number]     Melissa Gray , [birth year], 41 y.o., female MRN: [id number] Patient Care Team    Relationship Specialty Notifications Start End  Ma Hillock, DO PCP - General Family Medicine  06/03/15     CC: DM followup Subjective:  Melissa Gray is a 41 y.o.  Female, Pt presents for diabetes follow up.  Uncontrolled Diabetes Type 2:  Patient is not taking her metformin, states it has made her stomach hurt and caused diarrhea. She did not call in for recommendations. She denies numbness, tingling, blurred vision or hyperglycemic events. She has not been checking her blood glucose levels. She has not been exercising. She did go to the nutrition consult with her husband. She denies any non-healing wounds.   Prior note:  Pt presents for an follow up OV for her new onset diabetes. She has been trying to take the metformin but has been experiencing diarrhea and nausea. She is not checking her BG. She is starting to make dietary changes, adding more fresh vegetables to her diet. She has not started the exercise or nutrition classes (ordered for husband initially for her new onset DM). She denies non-healing wounds, hyper/hypoglycemic events or numbness/tingling in her extremities.   Allergies  Allergen Reactions  . Tape Other (See Comments)    blisters  . Levothyroxine Rash    Generic brand only caused rash   Social History  Substance Use Topics  . Smoking status: Never Smoker  . Smokeless tobacco: Never Used  . Alcohol use 0.0 oz/week   Past Medical History:  Diagnosis Date  . Chicken pox   . Diabetes mellitus without complication ([place])   . GERD (gastroesophageal reflux disease)   . Thyroid disease    Past Surgical History:  Procedure Laterality Date  . CHOLECYSTECTOMY  2006  . CYST EXCISION    . GLOMUS TUMOR EXCISION  2005, 2012   finger and cheek   Family History  Problem Relation Age of Onset    . Diabetes Mother   . COPD Father   . Heart disease Father   . Early death Father   . Diabetes Brother   . Alcohol abuse Maternal Aunt   . Alzheimer's disease Maternal Aunt   . Breast cancer Maternal Aunt   . Diabetes Maternal Grandmother   . Hearing loss Maternal Grandmother   . Early death Maternal Grandfather   . Cancer Maternal Grandfather   . Breast cancer Paternal Grandmother   . Heart disease Paternal Grandfather    Allergies as of 01/29/2016      Reactions   Tape Other (See Comments)   blisters   Levothyroxine Rash   Generic brand only caused rash      Medication List       Accurate as of 01/29/16 10:40 AM. Always use your most recent med list.          fluticasone 50 MCG/ACT nasal spray Commonly known as:  FLONASE   ibuprofen 400 MG tablet Commonly known as:  ADVIL,MOTRIN Take 400 mg by mouth every 6 (six) hours as needed for pain.   sitaGLIPtin 100 MG tablet Commonly known as:  JANUVIA Take 1 tablet (100 mg total) by mouth daily.       Results for orders placed or performed in visit on 01/29/16 (from the past 24 hour(s))  POCT glycosylated hemoglobin (Hb A1C)     Status: Abnormal   Collection Time:  01/29/16  8:18 AM  Result Value Ref Range   Hemoglobin A1C 10.1    No results found.   ROS: Negative, with the exception of above mentioned in HPI  Objective:  BP 127/85 (BP Location: Right Arm, Patient Position: Sitting, Cuff Size: Large)   Pulse 91   Temp 98.4 F (36.9 C)   Resp 20   Ht 5\' 6"  (1.676 m)   Wt 263 lb 4 oz (119.4 kg)   SpO2 99%   BMI 42.49 kg/m  Body mass index is 42.49 kg/m. Gen: Afebrile. No acute distress.  HENT: AT. [place].  MMM.  Eyes:Pupils Equal Round Reactive to light, Extraocular movements intact,  Conjunctiva without redness, discharge or icterus. CV: RRR, no edema, +2/4 P posterior tibialis pulses Chest: CTAB, no wheeze or crackles Abd: Soft. NTND. BS present.  Skin: no rashes, purpura or petechiae.  Neuro: Normal  gait. PERLA. EOMi. Alert. Oriented.   Assessment/Plan: Melissa Gray is a 41 y.o. female present for follow OV for  Newly diagnosed diabetes (HCC)/Morbid Obesity  ([place]) - a1c 7.5--> 8.5 --> 10.1 today - Eye exam UTD 03/26/2015  - Foot exam completed 07/02/2015 - Prevnar administered 6/7//2017; PNA23 due 06/2016 - BMP q 12 mos, completed 06/27/2015- normal kidney function  - microalbumin collected 08/21/2015- normal - lengthy discussion today surrounding  her DM. She was again made aware of the complication potential with uncontrolled diabetes. She should never go without medication, if in the future she can not tolerate a medication she is to call immediately and not wait for her next appt. She is in understanding.  - DC metformin, failed therapy with XR and regular.  - trial of Januvia  - f/u 3 months.  electronically signed by:  Howard Pouch, DO  [place]

## 2016-01-29 NOTE — Telephone Encounter (Signed)
Ordered januvia 100 mg QD for pt. She has been unable to tolerate all forms of metformin. Make certain it goes through, can we call pharmacy. Its on formulary, but not preferred. If it goes through please call pt with name of medication and instructions.

## 2016-01-29 NOTE — Patient Instructions (Signed)
I will call you with the name ans directions of new medicine. Your a1c is over 10 today. Please follow a diabetic diet.  Try to increase exercise to > 150 min a week.   Follow up in every 3 months.   Start checking your fasting blood glucose.    Diabetes Mellitus and Food It is important for you to manage your blood sugar (glucose) level. Your blood glucose level can be greatly affected by what you eat. Eating healthier foods in the appropriate amounts throughout the day at about the same time each day will help you control your blood glucose level. It can also help slow or prevent worsening of your diabetes mellitus. Healthy eating may even help you improve the level of your blood pressure and reach or maintain a healthy weight. General recommendations for healthful eating and cooking habits include:  Eating meals and snacks regularly. Avoid going long periods of time without eating to lose weight.  Eating a diet that consists mainly of plant-based foods, such as fruits, vegetables, nuts, legumes, and whole grains.  Using low-heat cooking methods, such as baking, instead of high-heat cooking methods, such as deep frying. Work with your dietitian to make sure you understand how to use the Nutrition Facts information on food labels. How can food affect me? Carbohydrates  Carbohydrates affect your blood glucose level more than any other type of food. Your dietitian will help you determine how many carbohydrates to eat at each meal and teach you how to count carbohydrates. Counting carbohydrates is important to keep your blood glucose at a healthy level, especially if you are using insulin or taking certain medicines for diabetes mellitus. Alcohol  Alcohol can cause sudden decreases in blood glucose (hypoglycemia), especially if you use insulin or take certain medicines for diabetes mellitus. Hypoglycemia can be a life-threatening condition. Symptoms of hypoglycemia (sleepiness, dizziness, and  disorientation) are similar to symptoms of having too much alcohol. If your health care provider has given you approval to drink alcohol, do so in moderation and use the following guidelines:  Women should not have more than one drink per day, and men should not have more than two drinks per day. One drink is equal to:  12 oz of beer.  5 oz of wine.  1 oz of hard liquor.  Do not drink on an empty stomach.  Keep yourself hydrated. Have water, diet soda, or unsweetened iced tea.  Regular soda, juice, and other mixers might contain a lot of carbohydrates and should be counted. What foods are not recommended? As you make food choices, it is important to remember that all foods are not the same. Some foods have fewer nutrients per serving than other foods, even though they might have the same number of calories or carbohydrates. It is difficult to get your body what it needs when you eat foods with fewer nutrients. Examples of foods that you should avoid that are high in calories and carbohydrates but low in nutrients include:  Trans fats (most processed foods list trans fats on the Nutrition Facts label).  Regular soda.  Juice.  Candy.  Sweets, such as cake, pie, doughnuts, and cookies.  Fried foods. What foods can I eat? Eat nutrient-rich foods, which will nourish your body and keep you healthy. The food you should eat also will depend on several factors, including:  The calories you need.  The medicines you take.  Your weight.  Your blood glucose level.  Your blood pressure level.  Your  cholesterol level. You should eat a variety of foods, including:  Protein.  Lean cuts of meat.  Proteins low in saturated fats, such as fish, egg whites, and beans. Avoid processed meats.  Fruits and vegetables.  Fruits and vegetables that may help control blood glucose levels, such as apples, mangoes, and yams.  Dairy products.  Choose fat-free or low-fat dairy products, such  as milk, yogurt, and cheese.  Grains, bread, pasta, and rice.  Choose whole grain products, such as multigrain bread, whole oats, and brown rice. These foods may help control blood pressure.  Fats.  Foods containing healthful fats, such as nuts, avocado, olive oil, canola oil, and fish. Does everyone with diabetes mellitus have the same meal plan? Because every person with diabetes mellitus is different, there is not one meal plan that works for everyone. It is very important that you meet with a dietitian who will help you create a meal plan that is just right for you. This information is not intended to replace advice given to you by your health care provider. Make sure you discuss any questions you have with your health care provider. Document Released: 10/08/2004 Document Revised: 06/19/2015 Document Reviewed: 12/08/2012 Elsevier Interactive Patient Education  2017 [employer].

## 2016-01-30 NOTE — Telephone Encounter (Signed)
Left message for patient with information and instructions regarding Januvia.

## 2016-03-11 ENCOUNTER — Ambulatory Visit (INDEPENDENT_AMBULATORY_CARE_PROVIDER_SITE_OTHER): Payer: Managed Care, Other (non HMO) | Admitting: Family Medicine

## 2016-03-11 ENCOUNTER — Encounter: Payer: Self-pay | Admitting: Family Medicine

## 2016-03-11 VITALS — BP 133/89 | HR 84 | Temp 98.0°F | Resp 20 | Wt 250.5 lb

## 2016-03-11 DIAGNOSIS — R112 Nausea with vomiting, unspecified: Secondary | ICD-10-CM

## 2016-03-11 DIAGNOSIS — R6889 Other general symptoms and signs: Secondary | ICD-10-CM | POA: Diagnosis not present

## 2016-03-11 DIAGNOSIS — A084 Viral intestinal infection, unspecified: Secondary | ICD-10-CM

## 2016-03-11 LAB — POC INFLUENZA A&B (BINAX/QUICKVUE)
INFLUENZA A, POC: NEGATIVE
INFLUENZA B, POC: NEGATIVE

## 2016-03-11 MED ORDER — ONDANSETRON 4 MG PO TBDP: 4.0000 mg | ORAL_TABLET | Freq: Three times a day (TID) | ORAL | 0 refills | Status: DC | PRN

## 2016-03-11 NOTE — Patient Instructions (Addendum)
Mucinex DM for cough.  Zofran prescribed for nausea. Make sure to stay hydrated with water and/or G2.  Tylenol/advil for aches and pains/fever.     Viral Illness, Adult Viruses are tiny germs that can get into a person's body and cause illness. There are many different types of viruses, and they cause many types of illness. Viral illnesses can range from mild to severe. They can affect various parts of the body. Common illnesses that are caused by a virus include colds and the flu. Viral illnesses also include serious conditions such as HIV/AIDS (human immunodeficiency virus/acquired immunodeficiency syndrome). A few viruses have been linked to certain cancers. What are the causes? Many types of viruses can cause illness. Viruses invade cells in your body, multiply, and cause the infected cells to malfunction or die. When the cell dies, it releases more of the virus. When this happens, you develop symptoms of the illness, and the virus continues to spread to other cells. If the virus takes over the function of the cell, it can cause the cell to divide and grow out of control, as is the case when a virus causes cancer. Different viruses get into the body in different ways. You can get a virus by:  Swallowing food or water that is contaminated with the virus.  Breathing in droplets that have been coughed or sneezed into the air by an infected person.  Touching a surface that has been contaminated with the virus and then touching your eyes, nose, or mouth.  Being bitten by an insect or animal that carries the virus.  Having sexual contact with a person who is infected with the virus.  Being exposed to blood or fluids that contain the virus, either through an open cut or during a transfusion. If a virus enters your body, your body's defense system (immune system) will try to fight the virus. You may be at higher risk for a viral illness if your immune system is weak. What are the signs or  symptoms? Symptoms vary depending on the type of virus and the location of the cells that it invades. Common symptoms of the main types of viral illnesses include: Cold and flu viruses  Fever.  Headache.  Sore throat.  Muscle aches.  Nasal congestion.  Cough. Digestive system (gastrointestinal) viruses  Fever.  Abdominal pain.  Nausea.  Diarrhea. Liver viruses (hepatitis)  Loss of appetite.  Tiredness.  Yellowing of the skin (jaundice). Brain and spinal cord viruses  Fever.  Headache.  Stiff neck.  Nausea and vomiting.  Confusion or sleepiness. Skin viruses  Warts.  Itching.  Rash. Sexually transmitted viruses  Discharge.  Swelling.  Redness.  Rash. How is this treated? Viruses can be difficult to treat because they live within cells. Antibiotic medicines do not treat viruses because these drugs do not get inside cells. Treatment for a viral illness may include:  Resting and drinking plenty of fluids.  Medicines to relieve symptoms. These can include over-the-counter medicine for pain and fever, medicines for cough or congestion, and medicines to relieve diarrhea.  Antiviral medicines. These drugs are available only for certain types of viruses. They may help reduce flu symptoms if taken early. There are also many antiviral medicines for hepatitis and HIV/AIDS. Some viral illnesses can be prevented with vaccinations. A common example is the flu shot. Follow these instructions at home: Medicines  Take over-the-counter and prescription medicines only as told by your health care provider.  If you were prescribed an antiviral medicine,  take it as told by your health care provider. Do not stop taking the medicine even if you start to feel better.  Be aware of when antibiotics are needed and when they are not needed. Antibiotics do not treat viruses. If your health care provider thinks that you may have a bacterial infection as well as a viral  infection, you may get an antibiotic.  Do not ask for an antibiotic prescription if you have been diagnosed with a viral illness. That will not make your illness go away faster.  Frequently taking antibiotics when they are not needed can lead to antibiotic resistance. When this develops, the medicine no longer works against the bacteria that it normally fights. General instructions  Drink enough fluids to keep your urine clear or pale yellow.  Rest as much as possible.  Return to your normal activities as told by your health care provider. Ask your health care provider what activities are safe for you.  Keep all follow-up visits as told by your health care provider. This is important. How is this prevented? Take these actions to reduce your risk of viral infection:  Eat a healthy diet and get enough rest.  Wash your hands often with soap and water. This is especially important when you are in public places. If soap and water are not available, use hand sanitizer.  Avoid close contact with friends and family who have a viral illness.  If you travel to areas where viral gastrointestinal infection is common, avoid drinking water or eating raw food.  Keep your immunizations up to date. Get a flu shot every year as told by your health care provider.  Do not share toothbrushes, nail clippers, razors, or needles with other people.  Always practice safe sex. Contact a health care provider if:  You have symptoms of a viral illness that do not go away.  Your symptoms come back after going away.  Your symptoms get worse. Get help right away if:  You have trouble breathing.  You have a severe headache or a stiff neck.  You have severe vomiting or abdominal pain. This information is not intended to replace advice given to you by your health care provider. Make sure you discuss any questions you have with your health care provider. Document Released: 05/23/2015 Document Revised:  06/25/2015 Document Reviewed: 05/23/2015 Elsevier Interactive Patient Education  2017 [employer].

## 2016-03-11 NOTE — Progress Notes (Signed)
Melissa Gray , 1975/05/31, 41 y.o., female MRN: [id number] Patient Care Team    Relationship Specialty Notifications Start End  Ma Hillock, DO PCP - General Family Medicine  06/03/15     CC: URI Subjective: Pt presents for an acute OV with complaints of cough of 1 day duration.  Associated symptoms include cough, headache, fever, vomit and vomiting. She had dinner with a friend last night that had the flu.  Pt has tried nothing to ease their symptoms.  She has had the flu shot this year.   Allergies  Allergen Reactions  . Tape Other (See Comments)    blisters  . Levothyroxine Rash    Generic brand only caused rash   Social History  Substance Use Topics  . Smoking status: Never Smoker  . Smokeless tobacco: Never Used  . Alcohol use 0.0 oz/week   Past Medical History:  Diagnosis Date  . Chicken pox   . Diabetes mellitus without complication ([place])   . GERD (gastroesophageal reflux disease)   . Thyroid disease    Past Surgical History:  Procedure Laterality Date  . CHOLECYSTECTOMY  2006  . CYST EXCISION    . GLOMUS TUMOR EXCISION  2005, 2012   finger and cheek   Family History  Problem Relation Age of Onset  . Diabetes Mother   . COPD Father   . Heart disease Father   . Early death Father   . Diabetes Brother   . Alcohol abuse Maternal Aunt   . Alzheimer's disease Maternal Aunt   . Breast cancer Maternal Aunt   . Diabetes Maternal Grandmother   . Hearing loss Maternal Grandmother   . Early death Maternal Grandfather   . Cancer Maternal Grandfather   . Breast cancer Paternal Grandmother   . Heart disease Paternal Grandfather    Allergies as of 03/11/2016      Reactions   Tape Other (See Comments)   blisters   Levothyroxine Rash   Generic brand only caused rash      Medication List       Accurate as of 03/11/16  8:32 AM. Always use your most recent med list.          fluticasone 50 MCG/ACT nasal spray Commonly known as:  FLONASE     ibuprofen 400 MG tablet Commonly known as:  ADVIL,MOTRIN Take 400 mg by mouth every 6 (six) hours as needed for pain.   sitaGLIPtin 100 MG tablet Commonly known as:  JANUVIA Take 1 tablet (100 mg total) by mouth daily.       No results found for this or any previous visit (from the past 24 hour(s)). No results found.   ROS: Negative, with the exception of above mentioned in HPI   Objective:  BP 133/89 (BP Location: Right Arm, Patient Position: Sitting, Cuff Size: Large)   Pulse 84   Temp 98 F (36.7 C)   Resp 20   Wt 250 lb 8 oz (113.6 kg)   SpO2 97%   BMI 40.43 kg/m  Body mass index is 40.43 kg/m. Gen: Afebrile. No acute distress. Nontoxic in appearance, well developed, well nourished. Feels hot, sweating.  HENT: AT. [place]. Bilateral TM visualized fullness. MMM, no oral lesions. Bilateral nares with erythema. Throat without erythema or exudates. mild cough.  Eyes:Pupils Equal Round Reactive to light, Extraocular movements intact,  Conjunctiva without redness, discharge or icterus. Neck/lymp/endocrine: Supple,no lymphadenopathy CV: RRR Chest: CTAB, no wheeze or crackles. Good air movement, normal resp effort.  Abd: Soft. obese. NTND. BS present Neuro: Normal gait. PERLA. EOMi. Alert. Oriented x3   No results found for this or any previous visit (from the past 24 hour(s)).   Assessment/Plan: Melissa Gray is a 41 y.o. female present for acute OV for  Flu-like symptoms - negative flu today.   Viral gastroenteritis/Nausea and vomiting, intractability of vomiting not specified, unspecified vomiting type - rest, hydrate. Zofran for nausea. - OTC mucinex for cough - ondansetron (ZOFRAN ODT) 4 MG disintegrating tablet; Take 1 tablet (4 mg total) by mouth every 8 (eight) hours as needed for nausea or vomiting.  Dispense: 20 tablet; Refill: 0   electronically signed by:  Howard Pouch, DO  [place]

## 2016-04-30 ENCOUNTER — Encounter: Payer: Self-pay | Admitting: Family Medicine

## 2016-04-30 ENCOUNTER — Ambulatory Visit (INDEPENDENT_AMBULATORY_CARE_PROVIDER_SITE_OTHER): Payer: Managed Care, Other (non HMO) | Admitting: Family Medicine

## 2016-04-30 VITALS — BP 118/78 | HR 87 | Temp 98.3°F | Resp 20 | Ht 66.0 in | Wt 248.2 lb

## 2016-04-30 DIAGNOSIS — E1165 Type 2 diabetes mellitus with hyperglycemia: Secondary | ICD-10-CM | POA: Diagnosis not present

## 2016-04-30 DIAGNOSIS — K920 Hematemesis: Secondary | ICD-10-CM

## 2016-04-30 DIAGNOSIS — Z8481 Family history of carrier of genetic disease: Secondary | ICD-10-CM | POA: Diagnosis not present

## 2016-04-30 DIAGNOSIS — R112 Nausea with vomiting, unspecified: Secondary | ICD-10-CM | POA: Diagnosis not present

## 2016-04-30 DIAGNOSIS — R928 Other abnormal and inconclusive findings on diagnostic imaging of breast: Secondary | ICD-10-CM

## 2016-04-30 DIAGNOSIS — IMO0001 Reserved for inherently not codable concepts without codable children: Secondary | ICD-10-CM

## 2016-04-30 LAB — POCT GLYCOSYLATED HEMOGLOBIN (HGB A1C): HEMOGLOBIN A1C: 11.3

## 2016-04-30 MED ORDER — OMEPRAZOLE 40 MG PO CPDR: 40.0000 mg | DELAYED_RELEASE_CAPSULE | Freq: Every day | ORAL | 0 refills | Status: DC

## 2016-04-30 MED ORDER — GLIMEPIRIDE 1 MG PO TABS: 1.0000 mg | ORAL_TABLET | Freq: Two times a day (BID) | ORAL | 0 refills | Status: DC

## 2016-04-30 NOTE — Patient Instructions (Signed)
I have started you on Amaryl 1 mg twice a day, follow up in 1 month with sugar logs (fasting and random in afternoon) I have referred you to GI to discuss your stomach issues. Start omeprazole in the meantime and No NSAIDS.   I have ordered your repeat mammogram.

## 2016-04-30 NOTE — Progress Notes (Signed)
Patient ID: Melissa Gray, female   DOB: 1975-10-02, 41 y.o.   MRN: [id number]     Melissa Gray , Mar 19, 1975, 41 y.o., female MRN: [id number] Patient Care Team    Relationship Specialty Notifications Start End  Ma Hillock, DO PCP - General Family Medicine  06/03/15     CC: DM followup Subjective:  Melissa Gray is a 41 y.o.  Female, Pt presents for diabetes follow up.  Uncontrolled Diabetes Type 2:  Patient presents today for her diabetes follow-up. She was unable to tolerate any type of metformin, and was started on Januvia 3 months ago. Patient reports compliance with Januvia. She is tolerating Januvia without any side effects. She does not monitor her blood sugars, because she does not want to. She denies any nonhealing wounds or numbness and tingling of her extremities. She was diagnosed with diabetes last year 2017. Prior note:  Patient is not taking her metformin, states it has made her stomach hurt and caused diarrhea. She did not call in for recommendations. She denies numbness, tingling, blurred vision or hyperglycemic events. She has not been checking her blood glucose levels. She has not been exercising. She did go to the nutrition consult with her husband. She denies any non-healing wounds.   Allergies  Allergen Reactions  . Tape Other (See Comments)    blisters  . Levothyroxine Rash    Generic brand only caused rash   Social History  Substance Use Topics  . Smoking status: Never Smoker  . Smokeless tobacco: Never Used  . Alcohol use 0.0 oz/week   Past Medical History:  Diagnosis Date  . Chicken pox   . Diabetes mellitus without complication ([place])   . GERD (gastroesophageal reflux disease)   . Thyroid disease    Past Surgical History:  Procedure Laterality Date  . CHOLECYSTECTOMY  2006  . CYST EXCISION    . GLOMUS TUMOR EXCISION  2005, 2012   finger and cheek   Family History  Problem Relation Age of Onset  . Diabetes Mother   . COPD Father   .  Heart disease Father   . Early death Father   . Diabetes Brother   . Alcohol abuse Maternal Aunt   . Alzheimer's disease Maternal Aunt   . Breast cancer Maternal Aunt   . Diabetes Maternal Grandmother   . Hearing loss Maternal Grandmother   . Early death Maternal Grandfather   . Cancer Maternal Grandfather   . Breast cancer Paternal Grandmother   . Heart disease Paternal Grandfather    Allergies as of 04/30/2016      Reactions   Tape Other (See Comments)   blisters   Levothyroxine Rash   Generic brand only caused rash      Medication List       Accurate as of 04/30/16  7:45 PM. Always use your most recent med list.          fluticasone 50 MCG/ACT nasal spray Commonly known as:  FLONASE   glimepiride 1 MG tablet Commonly known as:  AMARYL Take 1 tablet (1 mg total) by mouth 2 (two) times daily with a meal.   ibuprofen 400 MG tablet Commonly known as:  ADVIL,MOTRIN Take 400 mg by mouth every 6 (six) hours as needed for pain.   omeprazole 40 MG capsule Commonly known as:  PRILOSEC Take 1 capsule (40 mg total) by mouth daily.   sitaGLIPtin 100 MG tablet Commonly known as:  JANUVIA Take 1 tablet (100 mg total) by mouth  daily.       Results for orders placed or performed in visit on 04/30/16 (from the past 24 hour(s))  POCT glycosylated hemoglobin (Hb A1C)     Status: None   Collection Time: 04/30/16  8:14 AM  Result Value Ref Range   Hemoglobin A1C 11.3    No results found.   ROS: Negative, with the exception of above mentioned in HPI  Objective:  BP 118/78 (BP Location: Right Arm, Patient Position: Sitting, Cuff Size: Large)   Pulse 87   Temp 98.3 F (36.8 C)   Resp 20   Ht 5' 6"  (1.676 m)   Wt 248 lb 4 oz (112.6 kg)   SpO2 98%   BMI 40.07 kg/m  Body mass index is 40.07 kg/m. Gen: Afebrile. No acute distress. Obese Caucasian female. HENT: AT. [place].  MMM.  CV: RRR no murmur, trace chronic edema, +2/4 P posterior tibialis pulses Chest: CTAB, no wheeze  or crackles Abd: Soft. Obese. NTND. BS present. No Masses palpated.  Skin: No rashes, purpura or petechiae.  Neuro:  Normal gait. PERLA. EOMi. Alert. Oriented.  Psych: Tearful, otherwise Normal affect, dress and demeanor. Normal speech. Normal thought content and judgment..    Assessment/Plan: Melissa Gray is a 41 y.o. female present for follow OV for  Newly diagnosed diabetes (HCC)/Morbid Obesity  ([place]) - a1c 7.5--> 8.5 --> 10.1 --> greater than 11 A1c today. - Patient reported compliance, however I suspect she has not been taking her medications. There has been multiple attempts at serious lengthy discussion surrounding the nature of her diabetes, and what it is potentially doing to her body being so out of control. This was completed again today. She becomes tearful today and states she would take it more seriously. - uncontrolled - Eye exam UTD 03/26/2015  - Foot exam completed 07/02/2015 - Prevnar administered 6/7//2017; PNA23 due 06/2016 - BMP q 12 mos, completed 06/27/2015- normal kidney function  - microalbumin collected 08/21/2015- normal - Continue Januvia, start Amaryl 1 mg twice a day. - Patient is to monitor her fasting blood glucose every morning, and a random blood glucose in the afternoon to evening. Patient is to record all of these and bring with her to her next appointment. She states she will do it this time. I told her she better get used to checking her blood sugars because if she continues down this route, she will be on insulin, and will need to recheck in her blood sugars routinely. - f/u 1 months.  Abnormal mammogram of left breast FH: BRCA gene positive - overdue  - She is overdue for her 6 month diagnostic repeat of her left breast, I have placed these orders for her today, and I have encouraged her to schedule her repeat mammogram. - Strongly encouraged her to schedule this, discussed in length the importance of follow-up. - MM Digital Diagnostic Bilat; Future -  US BREAST COMPLETE UNI LEFT INC AXILLA; Future  Nausea and vomiting, intractability of vomiting not specified, unspecified vomiting type Hematemesis with nausea - new - omeprazole (PRILOSEC) 40 MG capsule; Take 1 capsule (40 mg total) by mouth daily.  Dispense: 90 capsule; Refill: 0 - Ambulatory referral to Gastroenterology - She added at the end of her appointment, that her nausea is worse and now she is seeing blood when she vomits. Her husband says this is occurring about 2 times a week now. - Prior mild nausea was thought to be secondary to potential gastroparesis and her uncontrolled diabetes, and  was hoping it would resolve with better control and/or stop the metformin. However, diabetes continues to worsen, and there has been much difficulty surrounding compliance with medications. -  PPI started today, and refer urgently to gastroenterology.  * After receiving AVS papers for the end of her visit, patient reports she's been having more "panic attacks ". Discussed with patient we cannot cover this today in addition to her other issues, this needs a separate appointment, and she should make an appointment as soon as possible to discuss this if she wishes to pursue workup. Patient agreed to make an appointment.  Greater than 40 minutes spent with patient, >50% of time spent face to face counseling and/or coordinating care.    electronically signed by:  Howard Pouch, DO  [place]

## 2016-05-05 ENCOUNTER — Other Ambulatory Visit: Payer: Managed Care, Other (non HMO)

## 2016-05-11 ENCOUNTER — Other Ambulatory Visit: Payer: Self-pay | Admitting: Family Medicine

## 2016-05-11 DIAGNOSIS — R928 Other abnormal and inconclusive findings on diagnostic imaging of breast: Secondary | ICD-10-CM

## 2016-05-12 ENCOUNTER — Other Ambulatory Visit: Payer: Managed Care, Other (non HMO)

## 2016-05-12 ENCOUNTER — Ambulatory Visit
Admission: RE | Admit: 2016-05-12 | Discharge: 2016-05-12 | Disposition: A | Discharge status: Home / Self Care | Payer: Managed Care, Other (non HMO) | Source: Ambulatory Visit | Attending: Family Medicine | Admitting: Family Medicine

## 2016-05-12 ENCOUNTER — Encounter: Payer: Self-pay | Admitting: Family Medicine

## 2016-05-12 ENCOUNTER — Ambulatory Visit (INDEPENDENT_AMBULATORY_CARE_PROVIDER_SITE_OTHER): Payer: Managed Care, Other (non HMO) | Admitting: Family Medicine

## 2016-05-12 VITALS — BP 116/79 | HR 81 | Temp 98.2°F | Resp 20 | Ht 66.0 in | Wt 248.5 lb

## 2016-05-12 DIAGNOSIS — F418 Other specified anxiety disorders: Secondary | ICD-10-CM | POA: Diagnosis not present

## 2016-05-12 DIAGNOSIS — R928 Other abnormal and inconclusive findings on diagnostic imaging of breast: Secondary | ICD-10-CM

## 2016-05-12 MED ORDER — VENLAFAXINE HCL ER 37.5 MG PO CP24: 37.5000 mg | ORAL_CAPSULE | Freq: Every day | ORAL | 0 refills | Status: DC

## 2016-05-12 MED ORDER — VENLAFAXINE HCL ER 75 MG PO CP24: 75.0000 mg | ORAL_CAPSULE | Freq: Every day | ORAL | 0 refills | Status: DC

## 2016-05-12 NOTE — Progress Notes (Signed)
Melissa Gray , 05/08/1975, 41 y.o., female MRN: [id number] Patient Care Team    Relationship Specialty Notifications Start End  Ma Hillock, DO PCP - General Family Medicine  06/03/15     CC: depression with anxiety Subjective: Pt presents for an OV with complaints of worsening depression and anxiety of the last 6 months. She reports she has had depression and anxiety problems throughout her entire life. She has not talked about it or seen a provider for it in the past. She has never been on medications for this issue. She endorses becoming more emotional, breaking out in tears frequently, lack of motivation to do anything including bath, be social, do choirs., take her medicines and take care of her diabetes. She feels she has been snapping at her husband. She feels she is putting all the choirs/house responsibilities on him and it makes her feel guilty. She feels he has been a great support. She has been unable to sleep well secondary to worrying and thinking instead of sleeping at bedtime. She thinks of "bad outcomes" of everyday subjects. She reports there is nothing in particular that causing her to feel this way, it is "just everything". She states she makes plans with her friends and then cancels a few hours prior. She knows she will have fun if she goes, but she is fearful she will have a panic attack. She reports having a few panic attacks recently, prior to that her last panic attack was about 5 years ago.   Mood disorder: negative today.  Depression screen [facility] 2/9 05/12/2016 07/02/2015 06/03/2015  Decreased Interest 2 0 2  Down, Depressed, Hopeless 3 0 1  PHQ - 2 Score 5 0 3  Altered sleeping 2 2 2   Tired, decreased energy 3 2 2   Change in appetite 2 0 2  Feeling bad or failure about yourself  3 0 2  Trouble concentrating 1 1 1   Moving slowly or fidgety/restless 2 0 0  Suicidal thoughts 0 0 0  PHQ-9 Score 18 5 12   Difficult doing work/chores - Somewhat difficult Somewhat  difficult   GAD 7 : Generalized Anxiety Score 05/12/2016  Nervous, Anxious, on Edge 2  Control/stop worrying 2  Worry too much - different things 3  Trouble relaxing 2  Restless 2  Easily annoyed or irritable 3  Afraid - awful might happen 3  Total GAD 7 Score 17  Anxiety Difficulty Extremely difficult     Allergies  Allergen Reactions  . Tape Other (See Comments)    blisters  . Levothyroxine Rash    Generic brand only caused rash   Social History  Substance Use Topics  . Smoking status: Never Smoker  . Smokeless tobacco: Never Used  . Alcohol use 0.0 oz/week   Past Medical History:  Diagnosis Date  . Chicken pox   . Diabetes mellitus without complication ([place])   . GERD (gastroesophageal reflux disease)   . Thyroid disease    Past Surgical History:  Procedure Laterality Date  . CHOLECYSTECTOMY  2006  . CYST EXCISION    . GLOMUS TUMOR EXCISION  2005, 2012   finger and cheek   Family History  Problem Relation Age of Onset  . Diabetes Mother   . COPD Father   . Heart disease Father   . Early death Father   . Diabetes Brother   . Alcohol abuse Maternal Aunt   . Alzheimer's disease Maternal Aunt   . Breast cancer Maternal Aunt   .  Diabetes Maternal Grandmother   . Hearing loss Maternal Grandmother   . Early death Maternal Grandfather   . Cancer Maternal Grandfather   . Breast cancer Paternal Grandmother   . Heart disease Paternal Grandfather    Allergies as of 05/12/2016      Reactions   Tape Other (See Comments)   blisters   Levothyroxine Rash   Generic brand only caused rash      Medication List       Accurate as of 05/12/16  9:22 AM. Always use your most recent med list.          fluticasone 50 MCG/ACT nasal spray Commonly known as:  FLONASE   glimepiride 1 MG tablet Commonly known as:  AMARYL Take 1 tablet (1 mg total) by mouth 2 (two) times daily with a meal.   ibuprofen 400 MG tablet Commonly known as:  ADVIL,MOTRIN Take 400 mg by  mouth every 6 (six) hours as needed for pain.   omeprazole 40 MG capsule Commonly known as:  PRILOSEC Take 1 capsule (40 mg total) by mouth daily.   ondansetron 4 MG disintegrating tablet Commonly known as:  ZOFRAN-ODT   sitaGLIPtin 100 MG tablet Commonly known as:  JANUVIA Take 1 tablet (100 mg total) by mouth daily.       No results found for this or any previous visit (from the past 24 hour(s)). No results found.   ROS: Negative, with the exception of above mentioned in HPI   Objective:  BP 116/79 (BP Location: Right Arm, Patient Position: Sitting, Cuff Size: Large)   Pulse 81   Temp 98.2 F (36.8 C)   Resp 20   Ht 5\' 6"  (1.676 m)   Wt 248 lb 8 oz (112.7 kg)   SpO2 97%   BMI 40.11 kg/m  Body mass index is 40.11 kg/m. Gen: Afebrile. No acute distress. Nontoxic in appearance, well developed, well nourished.  HENT: AT. [place]. MMM Eyes:Pupils Equal Round Reactive to light, Extraocular movements intact,  Conjunctiva without redness, discharge or icterus. Psych: tearful, sad, moderate anxiety.  Normal  Dress. Normal speech. Normal thought content and judgment.  Assessment/Plan: Melissa Gray is a 41 y.o. female present for OV for  Depression with anxiety New - offered counseling, pt would like to try medicine alone for now, but may be open to trying counseling later.  - options were discussed in length and pt agreeable to starting effexor taper.  - pt was counseled on proper use of medication, never to stop medicine abruptly. She is in agreement.  - venlafaxine XR (EFFEXOR XR) 37.5 MG 24 hr capsule; Take 1 capsule (37.5 mg total) by mouth daily with breakfast.  Dispense: 7 capsule; Refill: 0 - venlafaxine XR (EFFEXOR XR) 75 MG 24 hr capsule; Take 1 capsule (75 mg total) by mouth daily with breakfast.  Dispense: 30 capsule; Refill: 0 - F/U 4 weeks.   Reviewed expectations re: course of current medical issues.  Discussed self-management of symptoms.  Outlined signs  and symptoms indicating need for more acute intervention.  Patient verbalized understanding and all questions were answered.  Patient received an After-Visit Summary.   electronically signed by:  Howard Pouch, DO  [place]

## 2016-05-12 NOTE — Patient Instructions (Signed)
Major Depressive Disorder, Adult Major depressive disorder (MDD) is a mental health condition. MDD often makes you feel sad, hopeless, or helpless. MDD can also cause symptoms in your body. MDD can affect your:  Work.  School.  Relationships.  Other normal activities. MDD can range from mild to very bad. It may occur once (single episode MDD). It can also occur many times (recurrent MDD). The main symptoms of MDD often include:  Feeling sad, depressed, or irritable most of the time.  Loss of interest. MDD symptoms also include:  Sleeping too much or too little.  Eating too much or too little.  A change in your weight.  Feeling tired (fatigue) or having low energy.  Feeling worthless.  Feeling guilty.  Trouble making decisions.  Trouble thinking clearly.  Thoughts of suicide or harming others.  Feeling weak.  Feeling agitated.  Keeping yourself from being around other people (isolation). Follow these instructions at home: Activity   Do these things as told by your doctor:  Go back to your normal activities.  Exercise regularly.  Spend time outdoors. Alcohol   Talk with your doctor about how alcohol can affect your antidepressant medicines.  Do not drink alcohol. Or, limit how much alcohol you drink.  This means no more than 1 drink a day for nonpregnant women and 2 drinks a day for men. One drink equals one of these:  12 oz of beer.  5 oz of wine.  1 oz of hard liquor. General instructions   Take over-the-counter and prescription medicines only as told by your doctor.  Eat a healthy diet.  Get plenty of sleep.  Find activities that you enjoy. Make time to do them.  Think about joining a support group. Your doctor may be able to suggest a group for you.  Keep all follow-up visits as told by your doctor. This is important. Where to find more information:   [employer] on Mental Illness:  www.nami.org  [place]. National Institute of  Mental Health:  https://carter.com/  National Suicide Prevention Lifeline:  (775) 223-4130. This is free, 24-hour help. Contact a doctor if:  Your symptoms get worse.  You have new symptoms. Get help right away if:  You self-harm.  You see, hear, taste, smell, or feel things that are not present (hallucinate). If you ever feel like you may hurt yourself or others, or have thoughts about taking your own life, get help right away. You can go to your nearest emergency department or call:  Your local emergency services (911 in the [place]  A suicide crisis helpline, such as the [facility]:  6238330786. This is open 24 hours a day. This information is not intended to replace advice given to you by your health care provider. Make sure you discuss any questions you have with your health care provider. Document Released: 12/23/2014 Document Revised: 09/28/2015 Document Reviewed: 09/28/2015 Elsevier Interactive Patient Education  2017 [employer]   Generalized Anxiety Disorder, Adult Generalized anxiety disorder (GAD) is a mental health disorder. People with this condition constantly worry about everyday events. Unlike normal anxiety, worry related to GAD is not triggered by a specific event. These worries also do not fade or get better with time. GAD interferes with life functions, including relationships, work, and school. GAD can vary from mild to severe. People with severe GAD can have intense waves of anxiety with physical symptoms (panic attacks). What are the causes? The exact cause of GAD is not known. What increases the risk?  This condition is more likely to develop in:  Women.  People who have a family history of anxiety disorders.  People who are very shy.  People who experience very stressful life events, such as the death of a loved one.  People who have a very stressful family environment. What are the signs or symptoms? People with GAD  often worry excessively about many things in their lives, such as their health and family. They may also be overly concerned about:  Doing well at work.  Being on time.  Natural disasters.  Friendships. Physical symptoms of GAD include:  Fatigue.  Muscle tension or having muscle twitches.  Trembling or feeling shaky.  Being easily startled.  Feeling like your heart is pounding or racing.  Feeling out of breath or like you cannot take a deep breath.  Having trouble falling asleep or staying asleep.  Sweating.  Nausea, diarrhea, or irritable bowel syndrome (IBS).  Headaches.  Trouble concentrating or remembering facts.  Restlessness.  Irritability. How is this diagnosed? Your health care provider can diagnose GAD based on your symptoms and medical history. You will also have a physical exam. The health care provider will ask specific questions about your symptoms, including how severe they are, when they started, and if they come and go. Your health care provider may ask you about your use of alcohol or drugs, including prescription medicines. Your health care provider may refer you to a mental health specialist for further evaluation. Your health care provider will do a thorough examination and may perform additional tests to rule out other possible causes of your symptoms. To be diagnosed with GAD, a person must have anxiety that:  Is out of his or her control.  Affects several different aspects of his or her life, such as work and relationships.  Causes distress that makes him or her unable to take part in normal activities.  Includes at least three physical symptoms of GAD, such as restlessness, fatigue, trouble concentrating, irritability, muscle tension, or sleep problems. Before your health care provider can confirm a diagnosis of GAD, these symptoms must be present more days than they are not, and they must last for six months or longer. How is this  treated? The following therapies are usually used to treat GAD:  Medicine. Antidepressant medicine is usually prescribed for long-term daily control. Antianxiety medicines may be added in severe cases, especially when panic attacks occur.  Talk therapy (psychotherapy). Certain types of talk therapy can be helpful in treating GAD by providing support, education, and guidance. Options include:  Cognitive behavioral therapy (CBT). People learn coping skills and techniques to ease their anxiety. They learn to identify unrealistic or negative thoughts and behaviors and to replace them with positive ones.  Acceptance and commitment therapy (ACT). This treatment teaches people how to be mindful as a way to cope with unwanted thoughts and feelings.  Biofeedback. This process trains you to manage your body's response (physiological response) through breathing techniques and relaxation methods. You will work with a therapist while machines are used to monitor your physical symptoms.  Stress management techniques. These include yoga, meditation, and exercise. A mental health specialist can help determine which treatment is best for you. Some people see improvement with one type of therapy. However, other people require a combination of therapies. Follow these instructions at home:  Take over-the-counter and prescription medicines only as told by your health care provider.  Try to maintain a normal routine.  Try to anticipate stressful  situations and allow extra time to manage them.  Practice any stress management or self-calming techniques as taught by your health care provider.  Do not punish yourself for setbacks or for not making progress.  Try to recognize your accomplishments, even if they are small.  Keep all follow-up visits as told by your health care provider. This is important. Contact a health care provider if:  Your symptoms do not get better.  Your symptoms get worse.  You have  signs of depression, such as:  A persistently sad, cranky, or irritable mood.  Loss of enjoyment in activities that used to bring you joy.  Change in weight or eating.  Changes in sleeping habits.  Avoiding friends or family members.  Loss of energy for normal tasks.  Feelings of guilt or worthlessness. Get help right away if:  You have serious thoughts about hurting yourself or others. If you ever feel like you may hurt yourself or others, or have thoughts about taking your own life, get help right away. You can go to your nearest emergency department or call:  Your local emergency services (911 in the [place]  A suicide crisis helpline, such as the [facility] at (339)197-1680. This is open 24 hours a day. Summary  Generalized anxiety disorder (GAD) is a mental health disorder that involves worry that is not triggered by a specific event.  People with GAD often worry excessively about many things in their lives, such as their health and family.  GAD may cause physical symptoms such as restlessness, trouble concentrating, sleep problems, frequent sweating, nausea, diarrhea, headaches, and trembling or muscle twitching.  A mental health specialist can help determine which treatment is best for you. Some people see improvement with one type of therapy. However, other people require a combination of therapies. This information is not intended to replace advice given to you by your health care provider. Make sure you discuss any questions you have with your health care provider. Document Released: 05/08/2012 Document Revised: 12/02/2015 Document Reviewed: 12/02/2015 Elsevier Interactive Patient Education  2017 [employer].  Insomnia Insomnia is a sleep disorder that makes it difficult to fall asleep or to stay asleep. Insomnia can cause tiredness (fatigue), low energy, difficulty concentrating, mood swings, and poor performance at work or school. There  are three different ways to classify insomnia:  Difficulty falling asleep.  Difficulty staying asleep.  Waking up too early in the morning. Any type of insomnia can be long-term (chronic) or short-term (acute). Both are common. Short-term insomnia usually lasts for three months or less. Chronic insomnia occurs at least three times a week for longer than three months. What are the causes? Insomnia may be caused by another condition, situation, or substance, such as:  Anxiety.  Certain medicines.  Gastroesophageal reflux disease (GERD) or other gastrointestinal conditions.  Asthma or other breathing conditions.  Restless legs syndrome, sleep apnea, or other sleep disorders.  Chronic pain.  Menopause. This may include hot flashes.  Stroke.  Abuse of alcohol, tobacco, or illegal drugs.  Depression.  Caffeine.  An overactive thyroid (hyperthyroidism). The cause of insomnia may not be known. What increases the risk? Risk factors for insomnia include:  Gender. Women are more commonly affected than men.  Age. Insomnia is more common as you get older.  Stress. This may involve your professional or personal life.  Income. Insomnia is more common in people with lower income.  Lack of exercise.  Irregular work schedule or night shifts.  Traveling  between different time zones. What are the signs or symptoms? If you have insomnia, trouble falling asleep or trouble staying asleep is the main symptom. This may lead to other symptoms, such as:  Feeling fatigued.  Feeling nervous about going to sleep.  Not feeling rested in the morning.  Having trouble concentrating.  Feeling irritable, anxious, or depressed. How is this treated? Treatment for insomnia depends on the cause. If your insomnia is caused by an underlying condition, treatment will focus on addressing the condition. Treatment may also include:  Medicines to help you sleep.  Counseling or  therapy.  Lifestyle adjustments. Follow these instructions at home:  Take medicines only as directed by your health care provider.  Keep regular sleeping and waking hours. Avoid naps.  Keep a sleep diary to help you and your health care provider figure out what could be causing your insomnia. Include:  When you sleep.  When you wake up during the night.  How well you sleep.  How rested you feel the next day.  Any side effects of medicines you are taking.  What you eat and drink.  Make your bedroom a comfortable place where it is easy to fall asleep:  Put up shades or special blackout curtains to block light from outside.  Use a white noise machine to block noise.  Keep the temperature cool.  Exercise regularly as directed by your health care provider. Avoid exercising right before bedtime.  Use relaxation techniques to manage stress. Ask your health care provider to suggest some techniques that may work well for you. These may include:  Breathing exercises.  Routines to release muscle tension.  Visualizing peaceful scenes.  Cut back on alcohol, caffeinated beverages, and cigarettes, especially close to bedtime. These can disrupt your sleep.  Do not overeat or eat spicy foods right before bedtime. This can lead to digestive discomfort that can make it hard for you to sleep.  Limit screen use before bedtime. This includes:  Watching TV.  Using your smartphone, tablet, and computer.  Stick to a routine. This can help you fall asleep faster. Try to do a quiet activity, brush your teeth, and go to bed at the same time each night.  Get out of bed if you are still awake after 15 minutes of trying to sleep. Keep the lights down, but try reading or doing a quiet activity. When you feel sleepy, go back to bed.  Make sure that you drive carefully. Avoid driving if you feel very sleepy.  Keep all follow-up appointments as directed by your health care provider. This is  important. Contact a health care provider if:  You are tired throughout the day or have trouble in your daily routine due to sleepiness.  You continue to have sleep problems or your sleep problems get worse. Get help right away if:  You have serious thoughts about hurting yourself or someone else. This information is not intended to replace advice given to you by your health care provider. Make sure you discuss any questions you have with your health care provider. Document Released: 01/09/2000 Document Revised: 06/13/2015 Document Reviewed: 10/12/2013 Elsevier Interactive Patient Education  2017 [employer].

## 2016-05-28 ENCOUNTER — Encounter: Payer: Self-pay | Admitting: Family Medicine

## 2016-05-28 ENCOUNTER — Ambulatory Visit (INDEPENDENT_AMBULATORY_CARE_PROVIDER_SITE_OTHER): Payer: Managed Care, Other (non HMO) | Admitting: Family Medicine

## 2016-05-28 VITALS — BP 111/73 | HR 76 | Temp 98.3°F | Resp 20 | Ht 66.0 in | Wt 247.8 lb

## 2016-05-28 DIAGNOSIS — E1165 Type 2 diabetes mellitus with hyperglycemia: Secondary | ICD-10-CM | POA: Diagnosis not present

## 2016-05-28 DIAGNOSIS — F418 Other specified anxiety disorders: Secondary | ICD-10-CM

## 2016-05-28 DIAGNOSIS — IMO0001 Reserved for inherently not codable concepts without codable children: Secondary | ICD-10-CM

## 2016-05-28 LAB — POCT GLYCOSYLATED HEMOGLOBIN (HGB A1C): HEMOGLOBIN A1C: 9.6

## 2016-05-28 MED ORDER — GLIMEPIRIDE 1 MG PO TABS: 1.0000 mg | ORAL_TABLET | Freq: Two times a day (BID) | ORAL | 1 refills | Status: DC

## 2016-05-28 MED ORDER — SITAGLIPTIN PHOSPHATE 100 MG PO TABS: 100.0000 mg | ORAL_TABLET | Freq: Every day | ORAL | 3 refills | Status: DC

## 2016-05-28 NOTE — Progress Notes (Signed)
Melissa Gray , 1975/03/16, 41 y.o., female MRN: [id number] Patient Care Team    Relationship Specialty Notifications Start End  Ma Hillock, DO PCP - General Family Medicine  06/03/15    Chief Complaint  Patient presents with  . Diabetes    Subjective:  Uncontrolled type 2 diabetes mellitus without complication, without long-term current use of insulin ([facility])/Morbid obesity ([place]) Patient reports compliance with Januvia and doing well with amaryl 2 mg QD. She is still not monitoring her blood glucose levels. She does feel like she is ready to try now.  She denies nausea, dizziness or numbness/tingling ext.  - Eye exam UTD 03/26/2015  - Foot exam completed 07/02/2015 - Prevnar administered 6/7//2017; PNA23 due 06/2016 - BMP q 12 mos, completed 06/27/2015- normal kidney function  - microalbumin collected 08/21/2015- normal Prior note 04/30/2016: Patient presents today for her diabetes follow-up. She was unable to tolerate any type of metformin, and was started on Januvia 3 months ago. Patient reports compliance with Januvia. She is tolerating Januvia without any side effects. She does not monitor her blood sugars, because she does not want to. She denies any nonhealing wounds or numbness and tingling of her extremities. She was diagnosed with diabetes last year 2017.   Depression with anxiety Tolerating the effexor taper.has only been on <2 weeks, but feels improvement.    Prior note 05/12/2016: Pt presents for an OV with complaints of worsening depression and anxiety of the last 6 months. She reports she has had depression and anxiety problems throughout her entire life. She has not talked about it or seen a provider for it in the past. She has never been on medications for this issue. She endorses becoming more emotional, breaking out in tears frequently, lack of motivation to do anything including bath, be social, do choirs., take her medicines and take care of her diabetes. She feels she  has been snapping at her husband. She feels she is putting all the choirs/house responsibilities on him and it makes her feel guilty. She feels he has been a great support. She has been unable to sleep well secondary to worrying and thinking instead of sleeping at bedtime. She thinks of "bad outcomes" of everyday subjects. She reports there is nothing in particular that causing her to feel this way, it is "just everything". She states she makes plans with her friends and then cancels a few hours prior. She knows she will have fun if she goes, but she is fearful she will have a panic attack. She reports having a few panic attacks recently, prior to that her last panic attack was about 5 years ago.   Mood disorder: negative today.  Depression screen [facility] 2/9 05/12/2016 07/02/2015 06/03/2015  Decreased Interest 2 0 2  Down, Depressed, Hopeless 3 0 1  PHQ - 2 Score 5 0 3  Altered sleeping 2 2 2   Tired, decreased energy 3 2 2   Change in appetite 2 0 2  Feeling bad or failure about yourself  3 0 2  Trouble concentrating 1 1 1   Moving slowly or fidgety/restless 2 0 0  Suicidal thoughts 0 0 0  PHQ-9 Score 18 5 12   Difficult doing work/chores - Somewhat difficult Somewhat difficult   GAD 7 : Generalized Anxiety Score 05/12/2016  Nervous, Anxious, on Edge 2  Control/stop worrying 2  Worry too much - different things 3  Trouble relaxing 2  Restless 2  Easily annoyed or irritable 3  Afraid - awful  might happen 3  Total GAD 7 Score 17  Anxiety Difficulty Extremely difficult     Allergies  Allergen Reactions  . Tape Other (See Comments)    blisters  . Levothyroxine Rash    Generic brand only caused rash   Social History  Substance Use Topics  . Smoking status: Never Smoker  . Smokeless tobacco: Never Used  . Alcohol use 0.0 oz/week   Past Medical History:  Diagnosis Date  . Chicken pox   . Depression   . Diabetes mellitus without complication ([place])   . GERD (gastroesophageal reflux  disease)   . Thyroid disease    Past Surgical History:  Procedure Laterality Date  . CHOLECYSTECTOMY  2006  . CYST EXCISION    . GLOMUS TUMOR EXCISION  2005, 2012   finger and cheek   Family History  Problem Relation Age of Onset  . Diabetes Mother   . COPD Father   . Heart disease Father   . Early death Father   . Diabetes Brother   . Alcohol abuse Maternal Aunt   . Alzheimer's disease Maternal Aunt   . Breast cancer Maternal Aunt   . Diabetes Maternal Grandmother   . Hearing loss Maternal Grandmother   . Early death Maternal Grandfather   . Cancer Maternal Grandfather   . Breast cancer Paternal Grandmother   . Heart disease Paternal Grandfather   . Breast cancer Paternal Aunt    Allergies as of 05/28/2016      Reactions   Tape Other (See Comments)   blisters   Levothyroxine Rash   Generic brand only caused rash      Medication List       Accurate as of 05/28/16  9:02 AM. Always use your most recent med list.          fluticasone 50 MCG/ACT nasal spray Commonly known as:  FLONASE   glimepiride 1 MG tablet Commonly known as:  AMARYL Take 1 tablet (1 mg total) by mouth 2 (two) times daily with a meal.   ibuprofen 400 MG tablet Commonly known as:  ADVIL,MOTRIN Take 400 mg by mouth every 6 (six) hours as needed for pain.   omeprazole 40 MG capsule Commonly known as:  PRILOSEC Take 1 capsule (40 mg total) by mouth daily.   sitaGLIPtin 100 MG tablet Commonly known as:  JANUVIA Take 1 tablet (100 mg total) by mouth daily.   venlafaxine XR 75 MG 24 hr capsule Commonly known as:  EFFEXOR XR Take 1 capsule (75 mg total) by mouth daily with breakfast.       No results found for this or any previous visit (from the past 24 hour(s)). No results found.   ROS: Negative, with the exception of above mentioned in HPI   Objective:  BP 111/73 (BP Location: Right Arm, Patient Position: Sitting, Cuff Size: Large)   Pulse 76   Temp 98.3 F (36.8 C)   Resp 20    Ht 5\' 6"  (1.676 m)   Wt 247 lb 12 oz (112.4 kg)   SpO2 97%   BMI 39.99 kg/m  Body mass index is 39.99 kg/m. Gen: Afebrile. No acute distress.  HENT: AT. .  MMM.  Eyes:Pupils Equal Round Reactive to light, Extraocular movements intact,  Conjunctiva without redness, discharge or icterus. CV: RRR  Chest: CTAB, no wheeze or crackles Skin: no rashes, purpura or petechiae.  Neuro:  Normal gait. PERLA. EOMi. Alert. Oriented x3 Psych: Normal affect, dress and demeanor. Normal speech.  Normal thought content and judgment.    Assessment/Plan: Melissa Gray is a 41 y.o. female present for OV for  Uncontrolled type 2 diabetes mellitus without complication, without long-term current use of insulin ([place]) Morbid obesity ([place]) - tolerating new regimen of januvia and amaryl 2 mg total daily.  - POCT glycosylated hemoglobin (Hb A1C) - refills provided today - start monitoring fasting sugars - increase exercise and make dietary changes - sitaGLIPtin (JANUVIA) 100 MG tablet; Take 1 tablet (100 mg total) by mouth daily.  Dispense: 90 tablet; Refill: 3 - glimepiride (AMARYL) 1 MG tablet; Take 1 tablet (1 mg total) by mouth 2 (two) times daily with a meal.  Dispense: 180 tablet; Refill: 1 - Eye exam UTD 03/26/2015, [place] - Foot exam completed 07/02/2015 - Prevnar administered 6/7//2017; PNA23 due 06/2016 (next visit) - BMP q 12 mos, completed 06/27/2015- normal kidney function  - microalbumin collected 08/21/2015- normal - 3 months  Depression with anxiety Tolerating effexor taper, in week 2.  - Keep appt in 3 weeks for follow up, if doing well will follow with DM appt.  Reviewed expectations re: course of current medical issues.  Discussed self-management of symptoms.  Outlined signs and symptoms indicating need for more acute intervention.  Patient verbalized understanding and all questions were answered.  Patient received an After-Visit Summary.   electronically signed by:  Howard Pouch,  DO  [place]

## 2016-05-28 NOTE — Patient Instructions (Signed)
Great Job , keep it up you are doing great!   Blood Glucose Monitoring, Adult Monitoring your blood sugar (glucose) helps you manage your diabetes. It also helps you and your health care provider determine how well your diabetes management plan is working. Blood glucose monitoring involves checking your blood glucose as often as directed, and keeping a record (log) of your results over time. Why should I monitor my blood glucose? Checking your blood glucose regularly can:  Help you understand how food, exercise, illnesses, and medicines affect your blood glucose.  Let you know what your blood glucose is at any time. You can quickly tell if you are having low blood glucose (hypoglycemia) or high blood glucose (hyperglycemia).  Help you and your health care provider adjust your medicines as needed. When should I check my blood glucose? Follow instructions from your health care provider about how often to check your blood glucose. This may depend on:  The type of diabetes you have.  How well-controlled your diabetes is.  Medicines you are taking. If you have type 1 diabetes:   Check your blood glucose at least 2 times a day.  Also check your blood glucose:  Before every insulin injection.  Before and after exercise.  Between meals.  2 hours after a meal.  Occasionally between 2:00 a.m. and 3:00 a.m., as directed.  Before potentially dangerous tasks, like driving or using heavy machinery.  At bedtime.  You may need to check your blood glucose more often, up to 6-10 times a day:  If you use an insulin pump.  If you need multiple daily injections (MDI).  If your diabetes is not well-controlled.  If you are ill.  If you have a history of severe hypoglycemia.  If you have a history of not knowing when your blood glucose is getting low (hypoglycemia unawareness). If you have type 2 diabetes:   If you take insulin or other diabetes medicines, check your blood glucose at  least 2 times a day.  If you are on intensive insulin therapy, check your blood glucose at least 4 times a day. Occasionally, you may also need to check between 2:00 a.m. and 3:00 a.m., as directed.  Also check your blood glucose:  Before and after exercise.  Before potentially dangerous tasks, like driving or using heavy machinery.  You may need to check your blood glucose more often if:  Your medicine is being adjusted.  Your diabetes is not well-controlled.  You are ill. What is a blood glucose log?  A blood glucose log is a record of your blood glucose readings. It helps you and your health care provider:  Look for patterns in your blood glucose over time.  Adjust your diabetes management plan as needed.  Every time you check your blood glucose, write down your result and notes about things that may be affecting your blood glucose, such as your diet and exercise for the day.  Most glucose meters store a record of glucose readings in the meter. Some meters allow you to download your records to a computer. How do I check my blood glucose? Follow these steps to get accurate readings of your blood glucose: Supplies needed    Blood glucose meter.  Test strips for your meter. Each meter has its own strips. You must use the strips that come with your meter.  A needle to prick your finger (lancet). Do not use lancets more than once.  A device that holds the lancet (lancing device).  A journal or log book to write down your results. Procedure   Wash your hands with soap and water.  Prick the side of your finger (not the tip) with the lancet. Use a different finger each time.  Gently rub the finger until a small drop of blood appears.  Follow instructions that come with your meter for inserting the test strip, applying blood to the strip, and using your blood glucose meter.  Write down your result and any notes. Alternative testing sites   Some meters allow you to use  areas of your body other than your finger (alternative sites) to test your blood.  If you think you may have hypoglycemia, or if you have hypoglycemia unawareness, do not use alternative sites. Use your finger instead.  Alternative sites may not be as accurate as the fingers, because blood flow is slower in these areas. This means that the result you get may be delayed, and it may be different from the result that you would get from your finger.  The most common alternative sites are:  Forearm.  Thigh.  Palm of the hand. Additional tips   Always keep your supplies with you.  If you have questions or need help, all blood glucose meters have a 24-hour "hotline" number that you can call. You may also contact your health care provider.  After you use a few boxes of test strips, adjust (calibrate) your blood glucose meter by following instructions that came with your meter. This information is not intended to replace advice given to you by your health care provider. Make sure you discuss any questions you have with your health care provider. Document Released: 01/14/2003 Document Revised: 08/01/2015 Document Reviewed: 06/23/2015 Elsevier Interactive Patient Education  2017 [employer].

## 2016-06-17 ENCOUNTER — Encounter: Payer: Self-pay | Admitting: Family Medicine

## 2016-06-17 ENCOUNTER — Ambulatory Visit (INDEPENDENT_AMBULATORY_CARE_PROVIDER_SITE_OTHER): Payer: Managed Care, Other (non HMO) | Admitting: Family Medicine

## 2016-06-17 VITALS — BP 127/86 | HR 84 | Temp 98.0°F | Resp 20 | Ht 66.0 in | Wt 251.5 lb

## 2016-06-17 DIAGNOSIS — F418 Other specified anxiety disorders: Secondary | ICD-10-CM

## 2016-06-17 DIAGNOSIS — Z23 Encounter for immunization: Secondary | ICD-10-CM

## 2016-06-17 MED ORDER — VENLAFAXINE HCL ER 75 MG PO CP24: 75.0000 mg | ORAL_CAPSULE | Freq: Every day | ORAL | 1 refills | Status: DC

## 2016-06-17 NOTE — Progress Notes (Signed)
Melissa Gray , 26-Apr-1975, 41 y.o., female MRN: [id number] Patient Care Team    Relationship Specialty Notifications Start End  Ma Hillock, DO PCP - General Family Medicine  06/03/15     CC: depression with anxiety Subjective:  Depression with anxiety: pt is tolerating effexor 75 mg QD. She feels much improved on her medication. She is happy with this dose and would like refills.  PHQ 9 repeated today with improvement.   Prior note: Pt presents for an OV with complaints of worsening depression and anxiety of the last 6 months. She reports she has had depression and anxiety problems throughout her entire life. She has not talked about it or seen a provider for it in the past. She has never been on medications for this issue. She endorses becoming more emotional, breaking out in tears frequently, lack of motivation to do anything including bath, be social, do choirs., take her medicines and take care of her diabetes. She feels she has been snapping at her husband. She feels she is putting all the choirs/house responsibilities on him and it makes her feel guilty. She feels he has been a great support. She has been unable to sleep well secondary to worrying and thinking instead of sleeping at bedtime. She thinks of "bad outcomes" of everyday subjects. She reports there is nothing in particular that causing her to feel this way, it is "just everything". She states she makes plans with her friends and then cancels a few hours prior. She knows she will have fun if she goes, but she is fearful she will have a panic attack. She reports having a few panic attacks recently, prior to that her last panic attack was about 5 years ago.   Mood disorder screen: negative 05/12/2016  Depression screen [facility] 2/9 06/17/2016 05/12/2016 07/02/2015 06/03/2015  Decreased Interest 0 2 0 2  Down, Depressed, Hopeless 0 3 0 1  PHQ - 2 Score 0 5 0 3  Altered sleeping 0 2 2 2   Tired, decreased energy 0 3 2 2   Change in  appetite 0 2 0 2  Feeling bad or failure about yourself  0 3 0 2  Trouble concentrating 0 1 1 1   Moving slowly or fidgety/restless 0 2 0 0  Suicidal thoughts 0 0 0 0  PHQ-9 Score 0 18 5 12   Difficult doing work/chores - - Somewhat difficult Somewhat difficult   GAD 7 : Generalized Anxiety Score 05/12/2016  Nervous, Anxious, on Edge 2  Control/stop worrying 2  Worry too much - different things 3  Trouble relaxing 2  Restless 2  Easily annoyed or irritable 3  Afraid - awful might happen 3  Total GAD 7 Score 17  Anxiety Difficulty Extremely difficult     Allergies  Allergen Reactions  . Tape Other (See Comments)    blisters  . Levothyroxine Rash    Generic brand only caused rash   Social History  Substance Use Topics  . Smoking status: Never Smoker  . Smokeless tobacco: Never Used  . Alcohol use 0.0 oz/week   Past Medical History:  Diagnosis Date  . Chicken pox   . Depression   . Diabetes mellitus without complication ([place])   . GERD (gastroesophageal reflux disease)   . Thyroid disease    Past Surgical History:  Procedure Laterality Date  . CHOLECYSTECTOMY  2006  . CYST EXCISION    . GLOMUS TUMOR EXCISION  2005, 2012   finger and cheek  Family History  Problem Relation Age of Onset  . Diabetes Mother   . COPD Father   . Heart disease Father   . Early death Father   . Diabetes Brother   . Alcohol abuse Maternal Aunt   . Alzheimer's disease Maternal Aunt   . Breast cancer Maternal Aunt   . Diabetes Maternal Grandmother   . Hearing loss Maternal Grandmother   . Early death Maternal Grandfather   . Cancer Maternal Grandfather   . Breast cancer Paternal Grandmother   . Heart disease Paternal Grandfather   . Breast cancer Paternal Aunt    Allergies as of 06/17/2016      Reactions   Tape Other (See Comments)   blisters   Levothyroxine Rash   Generic brand only caused rash      Medication List       Accurate as of 06/17/16  8:23 AM. Always use your  most recent med list.          fluticasone 50 MCG/ACT nasal spray Commonly known as:  FLONASE   glimepiride 1 MG tablet Commonly known as:  AMARYL Take 1 tablet (1 mg total) by mouth 2 (two) times daily with a meal.   ibuprofen 400 MG tablet Commonly known as:  ADVIL,MOTRIN Take 400 mg by mouth every 6 (six) hours as needed for pain.   omeprazole 40 MG capsule Commonly known as:  PRILOSEC Take 1 capsule (40 mg total) by mouth daily.   sitaGLIPtin 100 MG tablet Commonly known as:  JANUVIA Take 1 tablet (100 mg total) by mouth daily.   venlafaxine XR 75 MG 24 hr capsule Commonly known as:  EFFEXOR XR Take 1 capsule (75 mg total) by mouth daily with breakfast.       No results found for this or any previous visit (from the past 24 hour(s)). No results found.   ROS: Negative, with the exception of above mentioned in HPI   Objective:  BP 127/86 (BP Location: Right Arm, Patient Position: Sitting, Cuff Size: Normal)   Pulse 84   Temp 98 F (36.7 C)   Resp 20   Ht 5\' 6"  (1.676 m)   Wt 251 lb 8 oz (114.1 kg)   SpO2 98%   BMI 40.59 kg/m  Body mass index is 40.59 kg/m. Gen: Afebrile. No acute distress. Appears well, and happy today.  HENT: AT. [place].  MMM. Psych: Normal affect, dress and demeanor. Normal speech. Normal thought content and judgment.  Assessment/Plan: Melissa Gray is a 41 y.o. female present for OV for  Depression with anxiety - Doing really well on medication . PHQ 9 repeated today with improvement.  - offered counseling, pt would like to try medicine alone for now, but may be open to trying counseling later.  - pt was counseled on proper use of medication, never to stop medicine abruptly. She is in agreement.  - Refills provided today of effexor 75 mg QD, #90, 1 refill.  - F/U with DM every 3 months.  Immunization:  - Pneumovax administered today to complete series.   Reviewed expectations re: course of current medical issues.  Discussed  self-management of symptoms.  Outlined signs and symptoms indicating need for more acute intervention.  Patient verbalized understanding and all questions were answered.  Patient received an After-Visit Summary.   electronically signed by:  Howard Pouch, DO  [place]

## 2016-06-17 NOTE — Patient Instructions (Signed)
I am glad you are doing well. We will follow this condition routinely at your Diabetes visits, unless problem arises and you need to be seen sooner.   You also had the pneumonia vaccine series completed today!  Please help [place] help you:  We are honored you have chosen [facility] for your Primary Care home. Below you will find basic instructions that you may need to access in the future. Please help [place] help you by reading the instructions, which cover many of the frequent questions we experience.   Prescription refills and request:  -In order to allow more efficient response time, please call your pharmacy for all refills. They will forward the request electronically to [place]. This allows for the quickest possible response. Request left on a nurse line can take longer to refill, since these are checked as time allows between office patients and other phone calls.  - refill request can take up to 3-5 working days to complete.  - If request is sent electronically and request is appropiate, it is usually completed in 1-2 business days.  - all patients will need to be seen routinely for all chronic medical conditions requiring prescription medications (see follow-up below). If you are overdue for follow up on your condition, you will be asked to make an appointment and we will call in enough medication to cover you until your appointment (up to 30 days).  - all controlled substances will require a face to face visit to request/refill.  - if you desire your prescriptions to go through a new pharmacy, and have an active script at original pharmacy, you will need to call your pharmacy and have scripts transferred to new pharmacy. This is completed between the pharmacy locations and not by your provider.    Results: If any images or labs were ordered, it can take up to 1 week to get results depending on the test ordered and the lab/facility running and resulting the test. - Normal or stable results, which  do not need further discussion, may be released to your mychart immediately with attached note to you. A call may not be generated for normal results. Please make certain to sign up for mychart. If you have questions on how to activate your mychart you can call the front office.  - If your results need further discussion, our office will attempt to contact you via phone, and if unable to reach you after 2 attempts, we will release your abnormal result to your mychart with instructions.  - All results will be automatically released in mychart after 1 week.  - Your provider will provide you with explanation and instruction on all relevant material in your results. Please keep in mind, results and labs may appear confusing or abnormal to the untrained eye, but it does not mean they are actually abnormal for you personally. If you have any questions about your results that are not covered, or you desire more detailed explanation than what was provided, you should make an appointment with your provider to do so.   Our office handles many outgoing and incoming calls daily. If we have not contacted you within 1 week about your results, please check your mychart to see if there is a message first and if not, then contact our office.  In helping with this matter, you help decrease call volume, and therefore allow [place] to be able to respond to patients needs more efficiently.   Acute office visits (sick visit):  An acute  visit is intended for a new problem and are scheduled in shorter time slots to allow schedule openings for patients with new problems. This is the appropriate visit to discuss a new problem. In order to provide you with excellent quality medical care with proper time for you to explain your problem, have an exam and receive treatment with instructions, these appointments should be limited to one new problem per visit. If you experience a new problem, in which you desire to be addressed, please make an  acute office visit, we save openings on the schedule to accommodate you. Please do not save your new problem for any other type of visit, let us take care of it properly and quickly for you.   Follow up visits:  Depending on your condition(s) your provider will need to see you routinely in order to provide you with quality care and prescribe medication(s). Most chronic conditions (Example: hypertension, Diabetes, depression/anxiety... etc), require visits a couple times a year. Your provider will instruct you on proper follow up for your personal medical conditions and history. Please make certain to make follow up appointments for your condition as instructed. Failing to do so could result in lapse in your medication treatment/refills. If you request a refill, and are overdue to be seen on a condition, we will always provide you with a 30 day script (once) to allow you time to schedule.    Medicare wellness (well visit): - we have a wonderful Nurse Maudie Mercury), that will meet with you and provide you will yearly medicare wellness visits. These visits should occur yearly (can not be scheduled less than 1 calendar year apart) and cover preventive health, immunizations, advance directives and screenings you are entitled to yearly through your medicare benefits. Do not miss out on your entitled benefits, this is when medicare will pay for these benefits to be ordered for you.  These are strongly encouraged by your provider and is the appropriate type of visit to make certain you are up to date with all preventive health benefits. If you have not had your medicare wellness exam in the last 12 months, please make certain to schedule one by calling the office and schedule your medicare wellness with Maudie Mercury as soon as possible.   Yearly physical (well visit):  - Adults are recommended to be seen yearly for physicals. Check with your insurance and date of your last physical, most insurances require one calendar year  between physicals. Physicals include all preventive health topics, screenings, medical exam and labs that are appropriate for gender/age and history. You may have fasting labs needed at this visit. This is a well visit (not a sick visit), new problems should not be covered during this visit (see acute visit).  - Pediatric patients are seen more frequently when they are younger. Your provider will advise you on well child visit timing that is appropriate for your their age. - This is not a medicare wellness visit. Medicare wellness exams do not have an exam portion to the visit. Some medicare companies allow for a physical, some do not allow a yearly physical. If your medicare allows a yearly physical you can schedule the medicare wellness with our nurse Maudie Mercury and have your physical with your provider after, on the same day. Please check with insurance for your full benefits.   Late Policy/No Shows:  - all new patients should arrive 15-30 minutes earlier than appointment to allow [place] time  to  obtain all personal demographics,  insurance information  and for you to complete office paperwork. - All established patients should arrive 10-15 minutes earlier than appointment time to update all information and be checked in .  - In our best efforts to run on time, if you are late for your appointment you will be asked to either reschedule or if able, we will work you back into the schedule. There will be a wait time to work you back in the schedule,  depending on availability.  - If you are unable to make it to your appointment as scheduled, please call 24 hours ahead of time to allow [place] to fill the time slot with someone else who needs to be seen. If you do not cancel your appointment ahead of time, you may be charged a no show fee.

## 2016-06-26 ENCOUNTER — Encounter (HOSPITAL_COMMUNITY): Payer: Self-pay | Admitting: Emergency Medicine

## 2016-06-26 ENCOUNTER — Emergency Department (HOSPITAL_COMMUNITY)
Admission: EM | Admit: 2016-06-26 | Discharge: 2016-06-26 | Disposition: A | Discharge status: Home / Self Care | Payer: Managed Care, Other (non HMO) | Attending: Emergency Medicine | Admitting: Emergency Medicine

## 2016-06-26 DIAGNOSIS — Y939 Activity, unspecified: Secondary | ICD-10-CM | POA: Insufficient documentation

## 2016-06-26 DIAGNOSIS — S61216A Laceration without foreign body of right little finger without damage to nail, initial encounter: Secondary | ICD-10-CM | POA: Diagnosis present

## 2016-06-26 DIAGNOSIS — Y929 Unspecified place or not applicable: Secondary | ICD-10-CM | POA: Insufficient documentation

## 2016-06-26 DIAGNOSIS — Z79899 Other long term (current) drug therapy: Secondary | ICD-10-CM | POA: Insufficient documentation

## 2016-06-26 DIAGNOSIS — W269XXA Contact with unspecified sharp object(s), initial encounter: Secondary | ICD-10-CM | POA: Insufficient documentation

## 2016-06-26 DIAGNOSIS — Y999 Unspecified external cause status: Secondary | ICD-10-CM | POA: Insufficient documentation

## 2016-06-26 DIAGNOSIS — E119 Type 2 diabetes mellitus without complications: Secondary | ICD-10-CM | POA: Diagnosis not present

## 2016-06-26 MED ORDER — LIDOCAINE HCL (PF) 1 % IJ SOLN
5.0000 mL | Freq: Once | INTRAMUSCULAR | Status: AC
  Administered 2016-06-26: 5 mL
  Filled 2016-06-26: qty 5

## 2016-06-26 NOTE — ED Triage Notes (Signed)
C/o lac to little finger on edge of foil container-- bleeding controlled after pt applied pressure. approx 1.5 "

## 2016-06-26 NOTE — ED Provider Notes (Signed)
[facility] DEPT Provider Note   CSN: [id number] Arrival date & time: 06/26/16  1545  By signing my name below, I, Ephriam Jenkins, attest that this documentation has been prepared under the direction and in the presence of [facility] NP.  Electronically Signed: Ephriam Jenkins, ED Scribe. 06/26/16. 5:31 PM.  History   Chief Complaint Chief Complaint  Patient presents with  . Extremity Laceration    HPI HPI Comments: Melissa Gray is a 41 y.o. female, with Hx of DM,  who presents to the Emergency Department s/p an injury that occurred approximately two hours ago. Pt was handling a foil container when it slipped and cut her right pinky finger. She has a small 2cm laceration to the ulnar aspect of her right pinky. Bleeding is currently controlled by gauze. She does report moderate pain to her right pinky which she describes as throbbing. Pt is able to move the finger without significant pain. Tetanus is UTD. No numbness or tingling. No medications taken PTA.   The history is provided by the patient. No language interpreter was used.      Past Medical History:  Diagnosis Date  . Chicken pox   . Depression   . Diabetes mellitus without complication ([place])   . GERD (gastroesophageal reflux disease)   . Thyroid disease     Patient Active Problem List   Diagnosis Date Noted  . Depression with anxiety 05/28/2016  . Uncontrolled type 2 diabetes mellitus without complication, without long-term current use of insulin ([place]) 01/29/2016  . Hypertriglyceridemia 07/02/2015  . Low HDL (under 40) 07/02/2015  . FH: BRCA gene positive 07/02/2015  . Morbid obesity ([place]) 06/03/2015    Past Surgical History:  Procedure Laterality Date  . CHOLECYSTECTOMY  2006  . CYST EXCISION    . GLOMUS TUMOR EXCISION  2005, 2012   finger and cheek    OB History    Gravida Para Term Preterm AB Living   0 0 0 0 0 0   SAB TAB Ectopic Multiple Live Births   0 0 0 0         Home Medications    Prior  to Admission medications   Medication Sig Start Date End Date Taking? Authorizing Provider  fluticasone (FLONASE) 50 MCG/ACT nasal spray  05/30/15   [provider]  glimepiride (AMARYL) 1 MG tablet Take 1 tablet (1 mg total) by mouth 2 (two) times daily with a meal. 05/28/16   Kuneff, Renee A, DO  ibuprofen (ADVIL,MOTRIN) 400 MG tablet Take 400 mg by mouth every 6 (six) hours as needed for pain.    [provider]  omeprazole (PRILOSEC) 40 MG capsule Take 1 capsule (40 mg total) by mouth daily. 04/30/16   Kuneff, Renee A, DO  sitaGLIPtin (JANUVIA) 100 MG tablet Take 1 tablet (100 mg total) by mouth daily. 05/28/16   Kuneff, Renee A, DO  venlafaxine XR (EFFEXOR XR) 75 MG 24 hr capsule Take 1 capsule (75 mg total) by mouth daily with breakfast. 06/17/16   Raoul Pitch, Renee A, DO    Family History Family History  Problem Relation Age of Onset  . Diabetes Mother   . COPD Father   . Heart disease Father   . Early death Father   . Diabetes Brother   . Alcohol abuse Maternal Aunt   . Alzheimer's disease Maternal Aunt   . Breast cancer Maternal Aunt   . Diabetes Maternal Grandmother   . Hearing loss Maternal Grandmother   . Early death  Maternal Grandfather   . Cancer Maternal Grandfather   . Breast cancer Paternal Grandmother   . Heart disease Paternal Grandfather   . Breast cancer Paternal Aunt     Social History Social History  Substance Use Topics  . Smoking status: Never Smoker  . Smokeless tobacco: Never Used  . Alcohol use 0.0 oz/week     Allergies   Tape and Levothyroxine   Review of Systems Review of Systems  Constitutional: Negative for chills, diaphoresis and fever.  Musculoskeletal: Positive for arthralgias (right pinky).  Skin: Positive for wound (~2cm laceration to right pinky).  Neurological: Negative for dizziness, syncope and headaches.  Psychiatric/Behavioral: The patient is not nervous/anxious.      Physical Exam Updated Vital Signs BP 117/80 (BP  Location: Right Arm)   Pulse 99   Temp 98.3 F (36.8 C) (Oral)   Resp 20   Ht _0  (1.676 m)   Wt 113.9 kg (251 lb)   SpO2 98%   BMI 40.51 kg/m   Physical Exam  Constitutional: She is oriented to person, place, and time. She appears well-developed and well-nourished. No distress.  HENT:  Head: Normocephalic and atraumatic.  Eyes: EOM are normal.  Neck: Normal range of motion.  Cardiovascular: Normal rate.   Pulmonary/Chest: Effort normal.  Musculoskeletal: Normal range of motion.  Neurological: She is alert and oriented to person, place, and time.  Skin: Skin is warm and dry. She is not diaphoretic.  ~2cm laceration to ulnar aspect of right pinky. Full ROM good strength. No tendon vizualized  Psychiatric: She has a normal mood and affect. Judgment normal.  Nursing note and vitals reviewed.    ED Treatments / Results  DIAGNOSTIC STUDIES: Oxygen Saturation is 97% on RA, normal by my interpretation.  COORDINATION OF CARE: 5:29 PM-Will suture laceration. Discussed treatment plan with pt at bedside and pt agreed to plan.   Procedures .Marland KitchenLaceration Repair Date/Time: 06/26/2016 5:35 PM Performed by: Ashley Murrain Authorized by: Ashley Murrain   Consent:    Consent obtained:  Verbal   Consent given by:  Patient   Risks discussed:  Infection and pain   Alternatives discussed:  No treatment Anesthesia (see MAR for exact dosages):    Anesthesia method:  Nerve block   Block needle gauge:  27 G   Block anesthetic:  Lidocaine 1% w/o epi   Block injection procedure:  Introduced needle, incremental injection, anatomic landmarks palpated and negative aspiration for blood   Block outcome:  Anesthesia achieved Laceration details:    Location:  Finger   Finger location:  R small finger   Length (cm):  2 Repair type:    Repair type:  Simple Pre-procedure details:    Preparation:  Patient was prepped and draped in usual sterile fashion and imaging obtained to evaluate for foreign  bodies Exploration:    Hemostasis achieved with:  Direct pressure   Wound exploration: entire depth of wound probed and visualized     Wound extent: no foreign bodies/material noted     Contaminated: no   Treatment:    Area cleansed with:  Betadine and saline   Amount of cleaning:  Standard   Irrigation solution:  Sterile saline   Irrigation method:  Syringe Skin repair:    Repair method:  Sutures   Number of sutures:  5 Approximation:    Approximation:  Close Post-procedure details:    Dressing:  Non-adherent dressing   Patient tolerance of procedure:  Tolerated well, no immediate complications    (  including critical care time)  Medications Ordered in ED Medications  lidocaine (PF) (XYLOCAINE) 1 % injection 5 mL (5 mLs Infiltration Given 06/26/16 1804)     Initial Impression / Assessment and Plan / ED Course  I have reviewed the triage vital signs and the nursing notes. Final Clinical Impressions(s) / ED Diagnoses  41 y.o. female with laceration to the little finger stable for d/c without focal neuro deficits. She will f/u for suture removal in 7 days or sooner for any signs of infection.   Final diagnoses:  Laceration of right little finger without foreign body without damage to nail, initial encounter    New Prescriptions Discharge Medication List as of 06/26/2016  6:13 PM    I personally performed the services described in this documentation, which was scribed in my presence. The recorded information has been reviewed and is accurate.     Debroah Baller [place], [place] 06/27/16 2356    Margette Fast, MD 06/28/16 1036

## 2016-07-05 ENCOUNTER — Ambulatory Visit (INDEPENDENT_AMBULATORY_CARE_PROVIDER_SITE_OTHER): Payer: Managed Care, Other (non HMO) | Admitting: Family Medicine

## 2016-07-05 ENCOUNTER — Encounter: Payer: Self-pay | Admitting: Family Medicine

## 2016-07-05 VITALS — BP 127/85 | HR 84 | Resp 16 | Wt 256.0 lb

## 2016-07-05 DIAGNOSIS — Z4802 Encounter for removal of sutures: Secondary | ICD-10-CM | POA: Diagnosis not present

## 2016-07-05 DIAGNOSIS — S61216A Laceration without foreign body of right little finger without damage to nail, initial encounter: Secondary | ICD-10-CM | POA: Diagnosis not present

## 2016-07-05 NOTE — Patient Instructions (Signed)
You did great. Keep clean and dry. Covered until scab is completely healed. No standing water (dishes, lakes etc, until scab gone).

## 2016-07-05 NOTE — Progress Notes (Signed)
Melissa Gray , 1975/06/10, 41 y.o., female MRN: [id number] Patient Care Team    Relationship Specialty Notifications Start End  Ma Hillock, DO PCP - General Family Medicine  06/03/15     Chief Complaint  Patient presents with  . Follow-up    right little finger stitch removal     Subjective: Pt presents for an OV to have 5 stitches removed from her right 5 th finger placed by the ED about 9 days ago. She does endorse some mild numbness/tingling distally from laceration that she noticed after the injury.   Depression screen [facility] 2/9 06/17/2016 05/12/2016 07/02/2015 06/03/2015  Decreased Interest 0 2 0 2  Down, Depressed, Hopeless 0 3 0 1  PHQ - 2 Score 0 5 0 3  Altered sleeping 0 2 2 2   Tired, decreased energy 0 3 2 2   Change in appetite 0 2 0 2  Feeling bad or failure about yourself  0 3 0 2  Trouble concentrating 0 1 1 1   Moving slowly or fidgety/restless 0 2 0 0  Suicidal thoughts 0 0 0 0  PHQ-9 Score 0 18 5 12   Difficult doing work/chores - - Somewhat difficult Somewhat difficult    Allergies  Allergen Reactions  . Tape Other (See Comments)    blisters  . Levothyroxine Rash    Generic brand only caused rash   Social History  Substance Use Topics  . Smoking status: Never Smoker  . Smokeless tobacco: Never Used  . Alcohol use 0.0 oz/week   Past Medical History:  Diagnosis Date  . Chicken pox   . Depression   . Diabetes mellitus without complication ([place])   . GERD (gastroesophageal reflux disease)   . Thyroid disease    Past Surgical History:  Procedure Laterality Date  . CHOLECYSTECTOMY  2006  . CYST EXCISION    . GLOMUS TUMOR EXCISION  2005, 2012   finger and cheek   Family History  Problem Relation Age of Onset  . Diabetes Mother   . COPD Father   . Heart disease Father   . Early death Father   . Diabetes Brother   . Alcohol abuse Maternal Aunt   . Alzheimer's disease Maternal Aunt   . Breast cancer Maternal Aunt   . Diabetes Maternal  Grandmother   . Hearing loss Maternal Grandmother   . Early death Maternal Grandfather   . Cancer Maternal Grandfather   . Breast cancer Paternal Grandmother   . Heart disease Paternal Grandfather   . Breast cancer Paternal Aunt    Allergies as of 07/05/2016      Reactions   Tape Other (See Comments)   blisters   Levothyroxine Rash   Generic brand only caused rash      Medication List       Accurate as of 07/05/16  8:24 AM. Always use your most recent med list.          fluticasone 50 MCG/ACT nasal spray Commonly known as:  FLONASE   glimepiride 1 MG tablet Commonly known as:  AMARYL Take 1 tablet (1 mg total) by mouth 2 (two) times daily with a meal.   ibuprofen 400 MG tablet Commonly known as:  ADVIL,MOTRIN Take 400 mg by mouth every 6 (six) hours as needed for pain.   omeprazole 40 MG capsule Commonly known as:  PRILOSEC Take 1 capsule (40 mg total) by mouth daily.   sitaGLIPtin 100 MG tablet Commonly known as:  JANUVIA Take 1  tablet (100 mg total) by mouth daily.   venlafaxine XR 75 MG 24 hr capsule Commonly known as:  EFFEXOR XR Take 1 capsule (75 mg total) by mouth daily with breakfast.       All past medical history, surgical history, allergies, family history, immunizations andmedications were updated in the EMR today and reviewed under the history and medication portions of their EMR.     ROS: Negative, with the exception of above mentioned in HPI   Objective:  BP 127/85 (BP Location: Left Arm, Patient Position: Sitting, Cuff Size: Large)   Pulse 84   Resp 16   Wt 256 lb (116.1 kg)   SpO2 96%   BMI 41.32 kg/m  Body mass index is 41.32 kg/m. Gen: Afebrile. No acute distress. Nontoxic in appearance, well developed, well nourished.  Skin: x5 prolene sutures, well healed laceration, mild numbness distally, good cap refill.  No exam data present No results found. No results found for this or any previous visit (from the past 24  hour(s)).  Assessment/Plan: Melissa Gray is a 41 y.o. female present for OV for  Laceration of right little finger without damage to nail, foreign body presence unspecified, initial encounter Encounter for removal of sutures - new problem for this provider, seen in ED.  - Removal of x5 prolene sutures. Pt tolerated procedure. She did endorse some shooting electric like pain with removal of sutures.  - discussed likely nerve involvement given her reports. Normal nerve regeneration was discussed.  - keep area clean and dry. Covered until completely healed, no standing water.   Reviewed expectations re: course of current medical issues.  Discussed self-management of symptoms.  Outlined signs and symptoms indicating need for more acute intervention.  Patient verbalized understanding and all questions were answered.  Patient received an After-Visit Summary.   Note is dictated utilizing voice recognition software. Although note has been proof read prior to signing, occasional typographical errors still can be missed. If any questions arise, please do not hesitate to call for verification.   electronically signed by:  Howard Pouch, DO  [place]

## 2016-08-23 ENCOUNTER — Other Ambulatory Visit: Payer: Self-pay

## 2016-08-23 DIAGNOSIS — R112 Nausea with vomiting, unspecified: Secondary | ICD-10-CM

## 2016-08-23 MED ORDER — OMEPRAZOLE 40 MG PO CPDR: 40.0000 mg | DELAYED_RELEASE_CAPSULE | Freq: Every day | ORAL | 0 refills | Status: DC

## 2016-08-23 NOTE — Telephone Encounter (Signed)
Refill sent #30 and no RF. Needs appt for further refills.

## 2016-08-31 ENCOUNTER — Ambulatory Visit: Payer: Managed Care, Other (non HMO) | Admitting: Family Medicine

## 2016-09-02 ENCOUNTER — Encounter: Payer: Self-pay | Admitting: Family Medicine

## 2016-09-29 ENCOUNTER — Other Ambulatory Visit: Payer: Self-pay | Admitting: *Deleted

## 2016-09-29 ENCOUNTER — Encounter: Payer: Self-pay | Admitting: *Deleted

## 2016-09-29 DIAGNOSIS — R112 Nausea with vomiting, unspecified: Secondary | ICD-10-CM

## 2016-09-29 MED ORDER — OMEPRAZOLE 40 MG PO CPDR: 40.0000 mg | DELAYED_RELEASE_CAPSULE | Freq: Every day | ORAL | 0 refills | Status: DC

## 2016-10-27 ENCOUNTER — Telehealth: Payer: Self-pay | Admitting: *Deleted

## 2016-10-27 NOTE — Telephone Encounter (Signed)
Patient called and left a message stating Dr Raoul Pitch told her if she developed any symptoms of sinus infection she could call and get an antibiotic sent in. She states she is leaving for vacation in the morning and would like an antibiotic called in today since she thinks she has started getting a sinus infection. Please advise

## 2016-10-27 NOTE — Telephone Encounter (Signed)
All patients need to be seen for illness in order to appropriately diagnose and treat. Mel must have misunderstood something we discussed.   - if symptoms started less than 3 days ago, recommend OTC supportive therapy with mucinex, Flonase, rest, hydrate and nsaids. If symptoms present longer she can be seen today, we have openings. She could also wait and be seen when she returns if not resolved or at an UC.

## 2016-10-27 NOTE — Telephone Encounter (Signed)
Spoke with patient reviewed information. Patient states she is unable to come in for a visit she is out of town and will not be back for 14 days. Advised patient to use cone video visit if symptoms get worse .

## 2016-11-02 ENCOUNTER — Other Ambulatory Visit: Payer: Self-pay | Admitting: Family Medicine

## 2016-11-02 DIAGNOSIS — R112 Nausea with vomiting, unspecified: Secondary | ICD-10-CM

## 2016-11-02 NOTE — Telephone Encounter (Signed)
Dr. Kuneff pt.  

## 2016-11-29 ENCOUNTER — Other Ambulatory Visit: Payer: Self-pay | Admitting: *Deleted

## 2016-11-29 DIAGNOSIS — E1165 Type 2 diabetes mellitus with hyperglycemia: Principal | ICD-10-CM

## 2016-11-29 DIAGNOSIS — F418 Other specified anxiety disorders: Secondary | ICD-10-CM

## 2016-11-29 DIAGNOSIS — IMO0001 Reserved for inherently not codable concepts without codable children: Secondary | ICD-10-CM

## 2016-11-29 MED ORDER — GLIMEPIRIDE 1 MG PO TABS: 1.0000 mg | ORAL_TABLET | Freq: Two times a day (BID) | ORAL | 1 refills | Status: DC

## 2016-11-29 MED ORDER — VENLAFAXINE HCL ER 75 MG PO CP24: 75.0000 mg | ORAL_CAPSULE | Freq: Every day | ORAL | 0 refills | Status: DC

## 2016-12-05 ENCOUNTER — Other Ambulatory Visit: Payer: Self-pay | Admitting: Family Medicine

## 2016-12-05 DIAGNOSIS — F418 Other specified anxiety disorders: Secondary | ICD-10-CM

## 2016-12-07 ENCOUNTER — Encounter: Payer: Self-pay | Admitting: *Deleted

## 2016-12-07 MED ORDER — VENLAFAXINE HCL ER 75 MG PO CP24: 75.0000 mg | ORAL_CAPSULE | Freq: Every day | ORAL | 0 refills | Status: DC

## 2016-12-09 ENCOUNTER — Encounter: Payer: Self-pay | Admitting: Family Medicine

## 2016-12-09 ENCOUNTER — Ambulatory Visit: Payer: Managed Care, Other (non HMO) | Admitting: Family Medicine

## 2016-12-09 VITALS — BP 116/77 | HR 85 | Temp 98.5°F | Resp 20 | Wt 260.8 lb

## 2016-12-09 DIAGNOSIS — F418 Other specified anxiety disorders: Secondary | ICD-10-CM

## 2016-12-09 DIAGNOSIS — E1165 Type 2 diabetes mellitus with hyperglycemia: Secondary | ICD-10-CM

## 2016-12-09 DIAGNOSIS — IMO0001 Reserved for inherently not codable concepts without codable children: Secondary | ICD-10-CM

## 2016-12-09 LAB — POCT GLYCOSYLATED HEMOGLOBIN (HGB A1C): Hemoglobin A1C: 7.8

## 2016-12-09 MED ORDER — VENLAFAXINE HCL ER 75 MG PO CP24: 75.0000 mg | ORAL_CAPSULE | Freq: Every day | ORAL | 1 refills | Status: DC

## 2016-12-09 MED ORDER — SITAGLIPTIN PHOSPHATE 100 MG PO TABS: 100.0000 mg | ORAL_TABLET | Freq: Every day | ORAL | 1 refills | Status: DC

## 2016-12-09 MED ORDER — GLIMEPIRIDE 1 MG PO TABS: ORAL_TABLET | ORAL | 1 refills | Status: DC

## 2016-12-09 NOTE — Patient Instructions (Signed)
You are doing great! Keep up the good work a1c went from 9.6--> 7.8 today! Goal 6 - 6.5.  Increase exercise, keep up the good work with diet changes.  Increase Amaryl to 2 tabs before breakfast and 1 tab in evening   Follow up in 3 months.    Have a great [place].   Remember to make your eye appt.  Next visit we will check your urine and feet.     Please help [place] help you:  We are honored you have chosen [facility] for your Primary Care home. Below you will find basic instructions that you may need to access in the future. Please help [place] help you by reading the instructions, which cover many of the frequent questions we experience.   Prescription refills and request:  -In order to allow more efficient response time, please call your pharmacy for all refills. They will forward the request electronically to [place]. This allows for the quickest possible response. Request left on a nurse line can take longer to refill, since these are checked as time allows between office patients and other phone calls.  - refill request can take up to 3-5 working days to complete.  - If request is sent electronically and request is appropiate, it is usually completed in 1-2 business days.  - all patients will need to be seen routinely for all chronic medical conditions requiring prescription medications (see follow-up below). If you are overdue for follow up on your condition, you will be asked to make an appointment and we will call in enough medication to cover you until your appointment (up to 30 days).  - all controlled substances will require a face to face visit to request/refill.  - if you desire your prescriptions to go through a new pharmacy, and have an active script at original pharmacy, you will need to call your pharmacy and have scripts transferred to new pharmacy. This is completed between the pharmacy locations and not by your provider.    Results: If any images or labs were  ordered, it can take up to 1 week to get results depending on the test ordered and the lab/facility running and resulting the test. - Normal or stable results, which do not need further discussion, may be released to your mychart immediately with attached note to you. A call may not be generated for normal results. Please make certain to sign up for mychart. If you have questions on how to activate your mychart you can call the front office.  - If your results need further discussion, our office will attempt to contact you via phone, and if unable to reach you after 2 attempts, we will release your abnormal result to your mychart with instructions.  - All results will be automatically released in mychart after 1 week.  - Your provider will provide you with explanation and instruction on all relevant material in your results. Please keep in mind, results and labs may appear confusing or abnormal to the untrained eye, but it does not mean they are actually abnormal for you personally. If you have any questions about your results that are not covered, or you desire more detailed explanation than what was provided, you should make an appointment with your provider to do so.   Our office handles many outgoing and incoming calls daily. If we have not contacted you within 1 week about your results, please check your mychart to see if there is a message first and  if not, then contact our office.  In helping with this matter, you help decrease call volume, and therefore allow [place] to be able to respond to patients needs more efficiently.   Acute office visits (sick visit):  An acute visit is intended for a new problem and are scheduled in shorter time slots to allow schedule openings for patients with new problems. This is the appropriate visit to discuss a new problem. In order to provide you with excellent quality medical care with proper time for you to explain your problem, have an exam and receive treatment with  instructions, these appointments should be limited to one new problem per visit. If you experience a new problem, in which you desire to be addressed, please make an acute office visit, we save openings on the schedule to accommodate you. Please do not save your new problem for any other type of visit, let us take care of it properly and quickly for you.   Follow up visits:  Depending on your condition(s) your provider will need to see you routinely in order to provide you with quality care and prescribe medication(s). Most chronic conditions (Example: hypertension, Diabetes, depression/anxiety... etc), require visits a couple times a year. Your provider will instruct you on proper follow up for your personal medical conditions and history. Please make certain to make follow up appointments for your condition as instructed. Failing to do so could result in lapse in your medication treatment/refills. If you request a refill, and are overdue to be seen on a condition, we will always provide you with a 30 day script (once) to allow you time to schedule.    Medicare wellness (well visit): - we have a wonderful Nurse Maudie Mercury), that will meet with you and provide you will yearly medicare wellness visits. These visits should occur yearly (can not be scheduled less than 1 calendar year apart) and cover preventive health, immunizations, advance directives and screenings you are entitled to yearly through your medicare benefits. Do not miss out on your entitled benefits, this is when medicare will pay for these benefits to be ordered for you.  These are strongly encouraged by your provider and is the appropriate type of visit to make certain you are up to date with all preventive health benefits. If you have not had your medicare wellness exam in the last 12 months, please make certain to schedule one by calling the office and schedule your medicare wellness with Maudie Mercury as soon as possible.   Yearly physical (well visit):   - Adults are recommended to be seen yearly for physicals. Check with your insurance and date of your last physical, most insurances require one calendar year between physicals. Physicals include all preventive health topics, screenings, medical exam and labs that are appropriate for gender/age and history. You may have fasting labs needed at this visit. This is a well visit (not a sick visit), new problems should not be covered during this visit (see acute visit).  - Pediatric patients are seen more frequently when they are younger. Your provider will advise you on well child visit timing that is appropriate for your their age. - This is not a medicare wellness visit. Medicare wellness exams do not have an exam portion to the visit. Some medicare companies allow for a physical, some do not allow a yearly physical. If your medicare allows a yearly physical you can schedule the medicare wellness with our nurse Maudie Mercury and have your physical with your provider after, on the  same day. Please check with insurance for your full benefits.   Late Policy/No Shows:  - all new patients should arrive 15-30 minutes earlier than appointment to allow [place] time  to  obtain all personal demographics,  insurance information and for you to complete office paperwork. - All established patients should arrive 10-15 minutes earlier than appointment time to update all information and be checked in .  - In our best efforts to run on time, if you are late for your appointment you will be asked to either reschedule or if able, we will work you back into the schedule. There will be a wait time to work you back in the schedule,  depending on availability.  - If you are unable to make it to your appointment as scheduled, please call 24 hours ahead of time to allow [place] to fill the time slot with someone else who needs to be seen. If you do not cancel your appointment ahead of time, you may be charged a no show fee.

## 2016-12-09 NOTE — Progress Notes (Signed)
Melissa Gray , [birth year], 41 y.o., female MRN: [id number] Patient Care Team    Relationship Specialty Notifications Start End  Ma Hillock, DO PCP - General Family Medicine  06/03/15    Chief Complaint  Patient presents with  . Diabetes  . Depression  . Anxiety    Subjective:  Uncontrolled type 2 diabetes mellitus without complication, without long-term current use of insulin (HCC)/Morbid obesity ([place]) Patient reports compliance which may be in Amaryl 2 mg daily. She still not monitoring her blood glucose levels. Patient denies dizziness, hyperglycemic or hypoglycemic events. Patient denies numbness, tingling in the extremities or nonhealing wounds of feet.  - Eye exam 03/26/2015  - Foot exam completed 07/02/2015 - Prevnar administered 6/7//2017; PNA23 completed 05/28/2016 - BMP q 12 mos, completed 06/27/2015- normal kidney function  - microalbumin collected 08/21/2015- normal Prior note 04/30/2016: Patient presents today for her diabetes follow-up. She was unable to tolerate any type of metformin, and was started on Januvia 3 months ago. Patient reports compliance with Januvia. She is tolerating Januvia without any side effects. She does not monitor her blood sugars, because she does not want to. She denies any nonhealing wounds or numbness and tingling of her extremities. She was diagnosed with diabetes last year 2017.   Depression with anxiety   patient is tolerating the Effexor 75 mg daily. She feels much improved on this medication. She is very happy with the results and would like refills today.   Prior note 05/12/2016: Pt presents for an OV with complaints of worsening depression and anxiety of the last 6 months. She reports she has had depression and anxiety problems throughout her entire life. She has not talked about it or seen a provider for it in the past. She has never been on medications for this issue. She endorses becoming more emotional, breaking out in tears  frequently, lack of motivation to do anything including bath, be social, do choirs., take her medicines and take care of her diabetes. She feels she has been snapping at her husband. She feels she is putting all the choirs/house responsibilities on him and it makes her feel guilty. She feels he has been a great support. She has been unable to sleep well secondary to worrying and thinking instead of sleeping at bedtime. She thinks of "bad outcomes" of everyday subjects. She reports there is nothing in particular that causing her to feel this way, it is "just everything". She states she makes plans with her friends and then cancels a few hours prior. She knows she will have fun if she goes, but she is fearful she will have a panic attack. She reports having a few panic attacks recently, prior to that her last panic attack was about 5 years ago.   Mood disorder: negative today.  Depression screen [facility] 2/9 12/09/2016 06/17/2016 05/12/2016 07/02/2015 06/03/2015  Decreased Interest 0 0 2 0 2  Down, Depressed, Hopeless 0 0 3 0 1  PHQ - 2 Score 0 0 5 0 3  Altered sleeping 0 0 2 2 2   Tired, decreased energy 0 0 3 2 2   Change in appetite 0 0 2 0 2  Feeling bad or failure about yourself  0 0 3 0 2  Trouble concentrating - 0 1 1 1   Moving slowly or fidgety/restless 0 0 2 0 0  Suicidal thoughts 0 0 0 0 0  PHQ-9 Score 0 0 18 5 12   Difficult doing work/chores - - - Somewhat difficult Somewhat  difficult   GAD 7 : Generalized Anxiety Score 12/09/2016 05/12/2016  Nervous, Anxious, on Edge 0 2  Control/stop worrying 0 2  Worry too much - different things 0 3  Trouble relaxing 0 2  Restless 0 2  Easily annoyed or irritable 0 3  Afraid - awful might happen 0 3  Total GAD 7 Score 0 17  Anxiety Difficulty - Extremely difficult     Allergies  Allergen Reactions  . Tape Other (See Comments)    blisters  . Levothyroxine Rash    Generic brand only caused rash   Social History   Tobacco Use  . Smoking status:  Never Smoker  . Smokeless tobacco: Never Used  Substance Use Topics  . Alcohol use: Yes    Alcohol/week: 0.0 oz   Past Medical History:  Diagnosis Date  . Chicken pox   . Depression   . Diabetes mellitus without complication ([place])   . GERD (gastroesophageal reflux disease)   . Thyroid disease    Past Surgical History:  Procedure Laterality Date  . CHOLECYSTECTOMY  2006  . CYST EXCISION    . GLOMUS TUMOR EXCISION  2005, 2012   finger and cheek   Family History  Problem Relation Age of Onset  . Diabetes Mother   . COPD Father   . Heart disease Father   . Early death Father   . Diabetes Brother   . Alcohol abuse Maternal Aunt   . Alzheimer's disease Maternal Aunt   . Breast cancer Maternal Aunt   . Diabetes Maternal Grandmother   . Hearing loss Maternal Grandmother   . Early death Maternal Grandfather   . Cancer Maternal Grandfather   . Breast cancer Paternal Grandmother   . Heart disease Paternal Grandfather   . Breast cancer Paternal Aunt    Allergies as of 12/09/2016      Reactions   Tape Other (See Comments)   blisters   Levothyroxine Rash   Generic brand only caused rash      Medication List        Accurate as of 12/09/16  9:21 AM. Always use your most recent med list.          fluticasone 50 MCG/ACT nasal spray Commonly known as:  FLONASE   glimepiride 1 MG tablet Commonly known as:  AMARYL Take 1 tablet (1 mg total) 2 (two) times daily with a meal by mouth.   omeprazole 40 MG capsule Commonly known as:  PRILOSEC TAKE ONE CAPSULE BY MOUTH EVERY DAY   sitaGLIPtin 100 MG tablet Commonly known as:  JANUVIA Take 1 tablet (100 mg total) by mouth daily.   venlafaxine XR 75 MG 24 hr capsule Commonly known as:  EFFEXOR XR Take 1 capsule (75 mg total) daily with breakfast by mouth. Needs appt prior to anymore refills       No results found for this or any previous visit (from the past 24 hour(s)). No results found.   ROS: Negative, with the  exception of above mentioned in HPI   Objective:  BP 116/77 (BP Location: Left Arm, Patient Position: Sitting, Cuff Size: Large)   Pulse 85   Temp 98.5 F (36.9 C)   Resp 20   Wt 260 lb 12 oz (118.3 kg)   SpO2 97%   BMI 42.09 kg/m  Body mass index is 42.09 kg/m. Gen: Afebrile. No acute distress. Nontoxic in appearance, well-developed, well-nourished, Caucasian female. HENT: AT. [place].  MMM.  Eyes:Pupils Equal Round Reactive to  light, Extraocular movements intact,  Conjunctiva without redness, discharge or icterus. CV: RRR no murmur, no edema, +2/4 P posterior tibialis pulses Chest: CTAB, no wheeze or crackles Abd: Soft. Obese. NTND. BS present. No Masses palpated.  Skin: No rashes, purpura or petechiae.  Neuro:  Normal gait. PERLA. EOMi. Alert. Oriented x3  Psych: Normal affect, dress and demeanor. Normal speech. Normal thought content and judgment.   Assessment/Plan: Maryiah Olvey is a 41 y.o. female present for OV for  Uncontrolled type 2 diabetes mellitus without complication, without long-term current use of insulin (HCC) Morbid obesity ([place]) - A1c 9.6--> 7.8 today - continue Januvia 100 mg daily, increase Amaryl to 2 mg before breakfast and 1 mg before dinner; refills provided today - start monitoring fasting sugars - increase exercise and make dietary changes. They have started the meal kits and are enjoying them. - sitaGLIPtin (JANUVIA) 100 MG tablet; Take 1 tablet (100 mg total) by mouth daily.  Dispense: 90 tablet; Refill: 3 - glimepiride (AMARYL) 1 MG tablet; Take 1 tablet (1 mg total) by mouth 2 (two) times daily with a meal.  Dispense: 180 tablet; Refill: 1 - Eye exam UTD 03/26/2015, [place] exam completed 07/02/2015--> pt declined today. Agreeable to next visit.  - Prevnar administered 6/7//2017; PNA23 completed 06/17/2016 - BMP collected today.  - microalbumin collected 08/21/2015- normal --> patient declined today, but will complete next visit. - 3  months  Depression anxiety: -Tolerating Effexor 75 mg daily, doing rather well. Refills provided today for 6 months.   Reviewed expectations re: course of current medical issues.  Discussed self-management of symptoms.  Outlined signs and symptoms indicating need for more acute intervention.  Patient verbalized understanding and all questions were answered.  Patient received an After-Visit Summary.   electronically signed by:  Howard Pouch, DO  [place]

## 2017-02-16 ENCOUNTER — Encounter: Payer: Self-pay | Admitting: *Deleted

## 2017-02-16 ENCOUNTER — Ambulatory Visit: Payer: Self-pay | Admitting: *Deleted

## 2017-02-16 ENCOUNTER — Ambulatory Visit: Payer: Managed Care, Other (non HMO) | Admitting: Family Medicine

## 2017-02-16 ENCOUNTER — Encounter: Payer: Self-pay | Admitting: Family Medicine

## 2017-02-16 DIAGNOSIS — F418 Other specified anxiety disorders: Secondary | ICD-10-CM | POA: Diagnosis not present

## 2017-02-16 MED ORDER — VENLAFAXINE HCL ER 150 MG PO CP24: 150.0000 mg | ORAL_CAPSULE | Freq: Every day | ORAL | 1 refills | Status: DC

## 2017-02-16 NOTE — Patient Instructions (Signed)
Finish up the bottle you have now by taking 2 of the 75 mg effexor. New Bottle will be 150 mg dose--> ONLY take one a day then.    Seasonal Affective Disorder Seasonal affective disorder (SAD) is a form of depression. It is when you feel sad, down, or blue at specific times of the year. The most common time of year for this is late fall and winter, when the days are shorter and most people spend less time outdoors. This is why SAD is also known as the "winter blues." SAD occurs less commonly in the spring or summer. SAD can vary in severity and can interfere with work, school, relationships, and normal daily activities. What increases the risk? This condition is more common in:  [place] women.  People who have a history of clinical depression or bipolar disorder.  People who live far [place] or far [place] of the equator.  What are the signs or symptoms? Symptoms of this condition include:  Depressed mood, such as: ? Feeling sad, down, blue, or teary. ? Having crying spells.  Irritability.  Trouble sleeping or sleeping more than usual.  Loss of interest in activities that you usually enjoy.  Feelings of guilt or worthlessness.  Restlessness or loss of energy.  Difficulty concentrating, remembering, or making decisions.  Significant change in appetite or weight.  Recurrent wishes for death, recurrent thoughts of self-harm, or an attempt at suicide.  How is this diagnosed? Diagnosis of this condition is usually made through an assessment by your health care provider. You will be asked about your moods, thoughts, and behaviors. You will also be asked about your medical history, any major life changes, medicines, and substance use. A physical exam and lab work may be done to make sure there is no other cause for your depression. You may be referred to a mental health specialist for further evaluation. How is this treated? Treatment for this condition may include:  Light therapy.  Light therapy involves sitting in front of a light source for 15-30 minutes every day. It is thought to work by increasing the duration of daylight that is sensed by the brain.  Antidepressant medicine.  Cognitive behavioral therapy (CBT).CBT is a form of talk therapy that helps to identify and change negative thoughts that are associated with SAD.  Follow these instructions at home:  Take over-the-counter and prescription medicines only as told by your healthcare provider. This is important.  Check with your health care provider before you start taking any new prescriptions, over-the-counter medicines, herbs, or supplements.  Keep all follow-up visits as told by your health care provider.This is important.  Maintain a healthy lifestyle. ? Eat healthy foods. ? Get plenty of sleep. ? Exercise regularly. ? Do not drink alcohol. ? Do not use recreational drugs.  Make your home and work environment as sunny or bright as possible. Open blinds and spend as much time as possible outside. Contact a health care provider if:  Your medicines do not seem to be helping.  Your symptoms do not improve or they get worse.  You have trouble taking care of yourself.  You experience side effects of medicines, such as changes in sleep patterns, dizziness, drowsiness, weight gain, restlessness, movement changes, or tremors. Get help right away if:  You have serious thoughts about hurting yourself or others.  You experience serious side effects of medicine, such as: ? Swelling of your face, lips, tongue, or throat. ? Fever, confusion, muscle spasms, or seizures. This information  is not intended to replace advice given to you by your health care provider. Make sure you discuss any questions you have with your health care provider. Document Released: 10/06/2000 Document Revised: 06/19/2015 Document Reviewed: 01/15/2014 Elsevier Interactive Patient Education  2018 [employer].   Major Depressive  Disorder, Adult Major depressive disorder (MDD) is a mental health condition. It may also be called clinical depression or unipolar depression. MDD usually causes feelings of sadness, hopelessness, or helplessness. MDD can also cause physical symptoms. It can interfere with work, school, relationships, and other everyday activities. MDD may be mild, moderate, or severe. It may occur once (single episode major depressive disorder) or it may occur multiple times (recurrent major depressive disorder). What are the causes? The exact cause of this condition is not known. MDD is most likely caused by a combination of things, which may include:  Genetic factors. These are traits that are passed along from parent to child.  Individual factors. Your personality, your behavior, and the way you handle your thoughts and feelings may contribute to MDD. This includes personality traits and behaviors learned from others.  Physical factors, such as: ? Differences in the part of your brain that controls emotion. This part of your brain may be different than it is in people who do not have MDD. ? Long-term (chronic) medical or psychiatric illnesses.  Social factors. Traumatic experiences or major life changes may play a role in the development of MDD.  What increases the risk? This condition is more likely to develop in women. The following factors may also make you more likely to develop MDD:  A family history of depression.  Troubled family relationships.  Abnormally low levels of certain brain chemicals.  Traumatic events in childhood, especially abuse or the loss of a parent.  Being under a lot of stress, or long-term stress, especially from upsetting life experiences or losses.  A history of: ? Chronic physical illness. ? Other mental health disorders. ? Substance abuse.  Poor living conditions.  Experiencing social exclusion or discrimination on a regular basis.  What are the signs or  symptoms? The main symptoms of MDD typically include:  Constant depressed or irritable mood.  Loss of interest in things and activities.  MDD symptoms may also include:  Sleeping or eating too much or too little.  Unexplained weight change.  Fatigue or low energy.  Feelings of worthlessness or guilt.  Difficulty thinking clearly or making decisions.  Thoughts of suicide or of harming others.  Physical agitation or weakness.  Isolation.  Severe cases of MDD may also occur with other symptoms, such as:  Delusions or hallucinations, in which you imagine things that are not real (psychotic depression).  Low-level depression that lasts at least a year (chronic depression or persistent depressive disorder).  Extreme sadness and hopelessness (melancholic depression).  Trouble speaking and moving (catatonic depression).  How is this diagnosed? This condition may be diagnosed based on:  Your symptoms.  Your medical history, including your mental health history. This may involve tests to evaluate your mental health. You may be asked questions about your lifestyle, including any drug and alcohol use, and how long you have had symptoms of MDD.  A physical exam.  Blood tests to rule out other conditions.  You must have a depressed mood and at least four other MDD symptoms most of the day, nearly every day in the same 2-week timeframe before your health care provider can confirm a diagnosis of MDD. How is this  treated? This condition is usually treated by mental health professionals, such as psychologists, psychiatrists, and clinical social workers. You may need more than one type of treatment. Treatment may include:  Psychotherapy. This is also called talk therapy or counseling. Types of psychotherapy include: ? Cognitive behavioral therapy (CBT). This type of therapy teaches you to recognize unhealthy feelings, thoughts, and behaviors, and replace them with positive thoughts  and actions. ? Interpersonal therapy (IPT). This helps you to improve the way you relate to and communicate with others. ? Family therapy. This treatment includes members of your family.  Medicine to treat anxiety and depression, or to help you control certain emotions and behaviors.  Lifestyle changes, such as: ? Limiting alcohol and drug use. ? Exercising regularly. ? Getting plenty of sleep. ? Making healthy eating choices. ? Spending more time outdoors.  Treatments involving stimulation of the brain can be used in situations with extremely severe symptoms, or when medicine or other therapies do not work over time. These treatments include electroconvulsive therapy, transcranial magnetic stimulation, and vagal nerve stimulation. Follow these instructions at home: Activity  Return to your normal activities as told by your health care provider.  Exercise regularly and spend time outdoors as told by your health care provider. General instructions  Take over-the-counter and prescription medicines only as told by your health care provider.  Do not drink alcohol. If you drink alcohol, limit your alcohol intake to no more than 1 drink a day for nonpregnant women and 2 drinks a day for men. One drink equals 12 oz of beer, 5 oz of wine, or 1 oz of hard liquor. Alcohol can affect any antidepressant medicines you are taking. Talk to your health care provider about your alcohol use.  Eat a healthy diet and get plenty of sleep.  Find activities that you enjoy doing, and make time to do them.  Consider joining a support group. Your health care provider may be able to recommend a support group.  Keep all follow-up visits as told by your health care provider. This is important. Where to find more information: [employer] on Mental Illness  www.nami.org  U.S. National Institute of Mental Health  https://carter.com/  National Suicide Prevention Lifeline  1-800-273-TALK 315-740-9830). This  is free, 24-hour help.  Contact a health care provider if:  Your symptoms get worse.  You develop new symptoms. Get help right away if:  You self-harm.  You have serious thoughts about hurting yourself or others.  You see, hear, taste, smell, or feel things that are not present (hallucinate). This information is not intended to replace advice given to you by your health care provider. Make sure you discuss any questions you have with your health care provider. Document Released: 05/08/2012 Document Revised: 09/18/2015 Document Reviewed: 07/23/2015 Elsevier Interactive Patient Education  [employer].

## 2017-02-16 NOTE — Progress Notes (Signed)
Melissa Gray , Nov 29, 1975, 42 y.o., female MRN: [id number] Patient Care Team    Relationship Specialty Notifications Start End  Ma Hillock, DO PCP - General Family Medicine  06/03/15    Chief Complaint  Patient presents with  . Depression    Subjective:  Depression with anxiety Patient presents today to discuss her depression with anxiety. She called into the office today stating that she could not get out of bed for over 4 days. He feels extremely depressed and she is not really certain why. She had no her depression increase mildly over the holidays. She reports she normally has some seasonal affective disorder and gets more depressed in the winter. She has been out of work for the last few days and tried to go in this morning and just felt completely overwhelmed and had to leave. She can't seem to get herself out of bed and motivate to do anything.   Prior note 05/12/2016: Pt presents for an OV with complaints of worsening depression and anxiety of the last 6 months. She reports she has had depression and anxiety problems throughout her entire life. She has not talked about it or seen a provider for it in the past. She has never been on medications for this issue. She endorses becoming more emotional, breaking out in tears frequently, lack of motivation to do anything including bath, be social, do choirs., take her medicines and take care of her diabetes. She feels she has been snapping at her husband. She feels she is putting all the choirs/house responsibilities on him and it makes her feel guilty. She feels he has been a great support. She has been unable to sleep well secondary to worrying and thinking instead of sleeping at bedtime. She thinks of "bad outcomes" of everyday subjects. She reports there is nothing in particular that causing her to feel this way, it is "just everything". She states she makes plans with her friends and then cancels a few hours prior. She knows she will  have fun if she goes, but she is fearful she will have a panic attack. She reports having a few panic attacks recently, prior to that her last panic attack was about 5 years ago.   Mood disorder: negative today.  Depression screen [facility] 2/9 02/16/2017 12/09/2016 06/17/2016 05/12/2016 07/02/2015  Decreased Interest 3 0 0 2 0  Down, Depressed, Hopeless 3 0 0 3 0  PHQ - 2 Score 6 0 0 5 0  Altered sleeping 3 0 0 2 2  Tired, decreased energy 3 0 0 3 2  Change in appetite 3 0 0 2 0  Feeling bad or failure about yourself  3 0 0 3 0  Trouble concentrating 3 - 0 1 1  Moving slowly or fidgety/restless - 0 0 2 0  Suicidal thoughts 0 0 0 0 0  PHQ-9 Score 21 0 0 18 5  Difficult doing work/chores - - - - Somewhat difficult   GAD 7 : Generalized Anxiety Score 02/16/2017 12/09/2016 05/12/2016  Nervous, Anxious, on Edge 3 0 2  Control/stop worrying 3 0 2  Worry too much - different things 3 0 3  Trouble relaxing 3 0 2  Restless 3 0 2  Easily annoyed or irritable 3 0 3  Afraid - awful might happen 3 0 3  Total GAD 7 Score 21 0 17  Anxiety Difficulty Extremely difficult - Extremely difficult   Allergies  Allergen Reactions  . Tape Other (See Comments)  blisters  . Levothyroxine Rash    Generic brand only caused rash   Social History   Tobacco Use  . Smoking status: Never Smoker  . Smokeless tobacco: Never Used  Substance Use Topics  . Alcohol use: Yes    Alcohol/week: 0.0 oz   Past Medical History:  Diagnosis Date  . Chicken pox   . Depression   . Diabetes mellitus without complication ([place])   . GERD (gastroesophageal reflux disease)   . Thyroid disease    Past Surgical History:  Procedure Laterality Date  . CHOLECYSTECTOMY  2006  . CYST EXCISION    . GLOMUS TUMOR EXCISION  2005, 2012   finger and cheek   Family History  Problem Relation Age of Onset  . Diabetes Mother   . COPD Father   . Heart disease Father   . Early death Father   . Diabetes Brother   . Alcohol abuse  Maternal Aunt   . Alzheimer's disease Maternal Aunt   . Breast cancer Maternal Aunt   . Diabetes Maternal Grandmother   . Hearing loss Maternal Grandmother   . Early death Maternal Grandfather   . Cancer Maternal Grandfather   . Breast cancer Paternal Grandmother   . Heart disease Paternal Grandfather   . Breast cancer Paternal Aunt    Allergies as of 02/16/2017      Reactions   Tape Other (See Comments)   blisters   Levothyroxine Rash   Generic brand only caused rash      Medication List        Accurate as of 02/16/17  2:40 PM. Always use your most recent med list.          fluticasone 50 MCG/ACT nasal spray Commonly known as:  FLONASE   glimepiride 1 MG tablet Commonly known as:  AMARYL 2 mg in the morning with breakfast and 1 mg with dinner   sitaGLIPtin 100 MG tablet Commonly known as:  JANUVIA Take 1 tablet (100 mg total) daily by mouth.   venlafaxine XR 75 MG 24 hr capsule Commonly known as:  EFFEXOR XR Take 1 capsule (75 mg total) daily with breakfast by mouth. Needs appt prior to anymore refills       No results found for this or any previous visit (from the past 24 hour(s)). No results found.   ROS: Negative, with the exception of above mentioned in HPI   Objective:  BP 136/70 (BP Location: Right Arm, Patient Position: Sitting, Cuff Size: Large)   Pulse 100   Resp 20   Wt 261 lb (118.4 kg)   LMP 02/09/2017   SpO2 97%   BMI 42.13 kg/m  Body mass index is 42.13 kg/m. Gen: Afebrile. No acute distress. Tearful today. Neuro:  Normal gait. PERLA. EOMi. Alert. Oriented x3 Psych: tearful and crying today. Sad. Well kempt. Makes good eye contact. Normal speech. Normal thought content and judgment.no SI or HI.   Assessment/Plan: Melissa Gray is a 42 y.o. female present for OV for  Depression anxiety: -worsening. PH Q is significantly elevated at 21. Patient had been very well controlled on Effexor 75 mg and was feeling quite well at last visit.  Understandably other suspicious breast rating to her to go from finally feeling good to where she is today.She denies any SI or HI. - Discussed many options with her today and agreed upon increasing her Effexor to 150 mg daily.  - she is not interested in therapy at this time. -  Patient  was encouraged if her symptoms worsen, and she needs emergent care to be seen at the [facility] emergency room - AVS with national suicide prevention 24-hour phone lifeline provided. - follow-up in 4 weeks, sooner if needed   Reviewed expectations re: course of current medical issues.  Discussed self-management of symptoms.  Outlined signs and symptoms indicating need for more acute intervention.  Patient verbalized understanding and all questions were answered.  Patient received an After-Visit Summary.   electronically signed by:  Howard Pouch, DO  [place]

## 2017-02-16 NOTE — Telephone Encounter (Signed)
Pt very tearful during triage call. States increased depression x 2 weeks. Has not been able to work all week but did go in today and "Feeling overwhelmed."  Reports sleeping more, has not gotten out of bed much, not showering, not eating. No new stressors. States "always have feelings of self doubt." Supportive husband. No suicidal  or homicidal ideation. Has been on Effexor 75mg  since April, 2018; taking regularly. [facility] ED visit. Spoke Flow Coordinator 'Diane.' Attempting to secure appt today. Instructed pt to call back if symptoms worsen. Home advice given per protocol. Reason for Disposition . [1] Depression AND [2] worsening (e.g.,sleeping poorly, less able to do activities of daily living)  Answer Assessment - Initial Assessment Questions 1. CONCERN: "What happened that made you call today?"     "Felt overwhelmed at work."  Haven't been able to work all week. 2. DEPRESSION SYMPTOM SCREENING: "How are you feeling overall?" (e.g., decreased energy, increased sleeping or difficulty sleeping, difficulty concentrating, feelings of sadness, guilt, hopelessness, or worthlessness)     Increased sleeping, not showering, self doubt. 3. RISK OF HARM - SUICIDAL IDEATION:  "Do you ever have thoughts of hurting or killing yourself?"  (e.g., yes, no, no but preoccupation with thoughts about death)   - INTENT:  "Do you have thoughts of hurting or killing yourself right NOW?" (e.g., yes, no, N/A)   - PLAN: "Do you have a specific plan for how you would do this?" (e.g., gun, knife, overdose, no plan, N/A)     No 4. RISK OF HARM - HOMICIDAL IDEATION:  "Do you ever have thoughts of hurting or killing someone else?"  (e.g., yes, no, no but preoccupation with thoughts about death)   - INTENT:  "Do you have thoughts of hurting or killing someone right NOW?" (e.g., yes, no, N/A)   - PLAN: "Do you have a specific plan for how you would do this?" (e.g., gun, knife, no plan, N/A)      No 5. FUNCTIONAL IMPAIRMENT:  "How have things been going for you overall in your life? Have you had any more difficulties than usual doing your normal daily activities?"  (e.g., better, same, worse; self-care, school, work, interactions)    "Things have been good." Stayed out of work this week, sleeping more.Loss of appetite. Difficult to go about my normal routine. Not showering. 6. SUPPORT: "Who is with you now?" "Who do you live with?" "Do you have family or friends nearby who you can talk to?"      Supportive husband, on Effexor 75mg  since April 2018. 7. THERAPIST: "Do you have a counselor or therapist? Name?"     No 8. STRESSORS: "Has there been any new stress or recent changes in your life?"     No 9. DRUG ABUSE/ALCOHOL: "Do you drink alcohol or use any illegal drugs?"     Hardly ever. 10. OTHER: "Do you have any other health or medical symptoms right now?" (e.g., fever)      "Fuzziness in head before I take my pill." 11. PREGNANCY: "Is there any chance you are pregnant?" "When was your last menstrual period?"       No  Protocols used: DEPRESSION-A-AH

## 2017-02-16 NOTE — Telephone Encounter (Signed)
Spoke with patient we will see her today as a work in at 2:30 informed patient she needs to be on time or we wont have time to see her. Patient verbalized understanding.

## 2017-02-17 ENCOUNTER — Encounter: Payer: Self-pay | Admitting: Family Medicine

## 2017-02-17 NOTE — Telephone Encounter (Signed)
This encounter was created in error - please disregard.

## 2017-03-15 ENCOUNTER — Ambulatory Visit: Payer: Managed Care, Other (non HMO) | Admitting: Family Medicine

## 2017-03-15 ENCOUNTER — Encounter: Payer: Self-pay | Admitting: Family Medicine

## 2017-03-15 VITALS — BP 113/76 | HR 84 | Temp 98.1°F | Ht 66.0 in | Wt 259.4 lb

## 2017-03-15 DIAGNOSIS — E1165 Type 2 diabetes mellitus with hyperglycemia: Secondary | ICD-10-CM | POA: Diagnosis not present

## 2017-03-15 DIAGNOSIS — F418 Other specified anxiety disorders: Secondary | ICD-10-CM

## 2017-03-15 DIAGNOSIS — IMO0001 Reserved for inherently not codable concepts without codable children: Secondary | ICD-10-CM

## 2017-03-15 LAB — MICROALBUMIN / CREATININE URINE RATIO
Creatinine,U: 118 mg/dL
Microalb Creat Ratio: 0.6 mg/g (ref 0.0–30.0)

## 2017-03-15 LAB — POCT GLYCOSYLATED HEMOGLOBIN (HGB A1C): HEMOGLOBIN A1C: 9.3

## 2017-03-15 MED ORDER — SITAGLIPTIN PHOSPHATE 100 MG PO TABS: 100.0000 mg | ORAL_TABLET | Freq: Every day | ORAL | 1 refills | Status: DC

## 2017-03-15 MED ORDER — GLIMEPIRIDE 2 MG PO TABS: ORAL_TABLET | ORAL | 1 refills | Status: DC

## 2017-03-15 NOTE — Progress Notes (Signed)
Melissa Gray , [birth year], 42 y.o., female MRN: [id number] Patient Care Team    Relationship Specialty Notifications Start End  Ma Hillock, DO PCP - General Family Medicine  06/03/15    Chief Complaint  Patient presents with  . Follow-up    DM    Subjective:  Uncontrolled type 2 diabetes mellitus without complication, without long-term current use of insulin (HCC)/Morbid obesity ([place]) Patient reports compliance with Januvia 100 mg daily and Amaryl 2 mg with breakfast and 1 mg with dinner. She still not monitoring her blood glucose levels. Patient denies dizziness, hyperglycemic or hypoglycemic events. Patient denies numbness, tingling in the extremities or nonhealing wounds of feet. She is not able to tolerate any type of metformin without severe GI side effects. - Eye exam 03/01/2017 - Foot exam completed today - Prevnar administered 6/7//2017; PNA23 completed 05/28/2016 - BMP normal last collection, due on next visit. - microalbumin collected today  Depression with anxiety   patient is tolerating the Effexor 150 mg daily (increased 4 weeks ago). She feels much improved after the increase and denies any side effects.   Prior note 05/12/2016: Pt presents for an OV with complaints of worsening depression and anxiety of the last 6 months. She reports she has had depression and anxiety problems throughout her entire life. She has not talked about it or seen a provider for it in the past. She has never been on medications for this issue. She endorses becoming more emotional, breaking out in tears frequently, lack of motivation to do anything including bath, be social, do choirs., take her medicines and take care of her diabetes. She feels she has been snapping at her husband. She feels she is putting all the choirs/house responsibilities on him and it makes her feel guilty. She feels he has been a great support. She has been unable to sleep well secondary to worrying and thinking  instead of sleeping at bedtime. She thinks of "bad outcomes" of everyday subjects. She reports there is nothing in particular that causing her to feel this way, it is "just everything". She states she makes plans with her friends and then cancels a few hours prior. She knows she will have fun if she goes, but she is fearful she will have a panic attack. She reports having a few panic attacks recently, prior to that her last panic attack was about 5 years ago.   Mood disorder: negative today.  Depression screen [facility] 2/9 03/15/2017 02/16/2017 12/09/2016 06/17/2016 05/12/2016  Decreased Interest 0 3 0 0 2  Down, Depressed, Hopeless 1 3 0 0 3  PHQ - 2 Score 1 6 0 0 5  Altered sleeping 1 3 0 0 2  Tired, decreased energy 1 3 0 0 3  Change in appetite 1 3 0 0 2  Feeling bad or failure about yourself  0 3 0 0 3  Trouble concentrating 0 3 - 0 1  Moving slowly or fidgety/restless 0 - 0 0 2  Suicidal thoughts 0 0 0 0 0  PHQ-9 Score 4 21 0 0 18  Difficult doing work/chores - - - - -   GAD 7 : Generalized Anxiety Score 03/15/2017 02/16/2017 12/09/2016 05/12/2016  Nervous, Anxious, on Edge 1 3 0 2  Control/stop worrying 1 3 0 2  Worry too much - different things 1 3 0 3  Trouble relaxing 1 3 0 2  Restless 0 3 0 2  Easily annoyed or irritable 1 3 0 3  Afraid - awful might happen 1 3 0 3  Total GAD 7 Score 6 21 0 17  Anxiety Difficulty - Extremely difficult - Extremely difficult     Allergies  Allergen Reactions  . Tape Other (See Comments)    blisters  . Levothyroxine Rash    Generic brand only caused rash   Social History   Tobacco Use  . Smoking status: Never Smoker  . Smokeless tobacco: Never Used  Substance Use Topics  . Alcohol use: Yes    Alcohol/week: 0.0 oz   Past Medical History:  Diagnosis Date  . Chicken pox   . Depression   . Diabetes mellitus without complication ([place])   . GERD (gastroesophageal reflux disease)   . Thyroid disease    Past Surgical History:  Procedure  Laterality Date  . CHOLECYSTECTOMY  2006  . CYST EXCISION    . GLOMUS TUMOR EXCISION  2005, 2012   finger and cheek   Family History  Problem Relation Age of Onset  . Diabetes Mother   . COPD Father   . Heart disease Father   . Early death Father   . Diabetes Brother   . Alcohol abuse Maternal Aunt   . Alzheimer's disease Maternal Aunt   . Breast cancer Maternal Aunt   . Diabetes Maternal Grandmother   . Hearing loss Maternal Grandmother   . Early death Maternal Grandfather   . Cancer Maternal Grandfather   . Breast cancer Paternal Grandmother   . Heart disease Paternal Grandfather   . Breast cancer Paternal Aunt    Allergies as of 03/15/2017      Reactions   Tape Other (See Comments)   blisters   Levothyroxine Rash   Generic brand only caused rash      Medication List        Accurate as of 03/15/17 12:31 PM. Always use your most recent med list.          fluticasone 50 MCG/ACT nasal spray Commonly known as:  FLONASE   glimepiride 2 MG tablet Commonly known as:  AMARYL 2 mg before breakfast and dinner   sitaGLIPtin 100 MG tablet Commonly known as:  JANUVIA Take 1 tablet (100 mg total) by mouth daily.   venlafaxine XR 150 MG 24 hr capsule Commonly known as:  EFFEXOR XR Take 1 capsule (150 mg total) by mouth daily with breakfast. Needs appt prior to anymore refills       Results for orders placed or performed in visit on 03/15/17 (from the past 24 hour(s))  POCT HgB A1C     Status: Abnormal   Collection Time: 03/15/17  8:19 AM  Result Value Ref Range   Hemoglobin A1C 9.3    No results found.   ROS: Negative, with the exception of above mentioned in HPI   Objective:  BP 113/76 (BP Location: Right Arm, Patient Position: Sitting, Cuff Size: Large)   Pulse 84   Temp 98.1 F (36.7 C) (Oral)   Ht 5\' 6"  (1.676 m)   Wt 259 lb 6.4 oz (117.7 kg)   LMP 03/10/2017   SpO2 96%   BMI 41.87 kg/m  Body mass index is 41.87 kg/m. Gen: Afebrile. No acute  distress. Nontoxic. Pleasant, Caucasian female. Obese. HENT: AT. [place]. Bilateral TM visualized and normal in appearance. MMM.  Eyes:Pupils Equal Round Reactive to light, Extraocular movements intact,  Conjunctiva without redness, discharge or icterus. CV: RRR no murmur, no edema, +2/4 P posterior tibialis pulses Chest: CTAB, no wheeze  or crackles Abd: Soft. Obese. NTND. BS present.  Skin: no rashes, purpura or petechiae.  Neuro:  Normal gait. PERLA. EOMi. Alert. Oriented x3 Psych: Normal affect, dress and demeanor. Normal speech. Normal thought content and judgment. Diabetic Foot Exam - Simple   Simple Foot Form Diabetic Foot exam was performed with the following findings:  Yes 03/15/2017 12:27 PM  Visual Inspection No deformities, no ulcerations, no other skin breakdown bilaterally:  Yes Sensation Testing Intact to touch and monofilament testing bilaterally:  Yes Pulse Check Posterior Tibialis and Dorsalis pulse intact bilaterally:  Yes Comments      Results for orders placed or performed in visit on 03/15/17 (from the past 24 hour(s))  POCT HgB A1C     Status: Abnormal   Collection Time: 03/15/17  8:19 AM  Result Value Ref Range   Hemoglobin A1C 9.3      Assessment/Plan: Melissa Gray is a 42 y.o. female present for OV for  Uncontrolled type 2 diabetes mellitus without complication, without long-term current use of insulin (HCC) Morbid obesity (HCC) - A1c 9.6--> 7.8 --> 9.3. Patient reports she has not been as compliant in her diet when her depression was increased. She feels she is in more control of her diet now. - continue Januvia 100 mg daily. -  increase Amaryl to 2 mg before breakfast and 2 mg before dinner; refills provided today - start monitoring fasting sugars - sitaGLIPtin (JANUVIA) 100 MG tablet; Take 1 tablet (100 mg total) by mouth daily.  Dispense: 90 tablet; Refill: 3 - glimepiride (AMARYL) 1 MG tablet; Take 1 tablet (1 mg total) by mouth 2 (two) times daily  with a meal.  Dispense: 180 tablet; Refill: 1 - Eye exam UTD 03/26/2015, [place]--> 03/01/2017 awaiting records. - Foot exam completed today - Prevnar administered 6/7//2017; PNA23 completed 06/17/2016 - microalbumin collected 08/21/2015- normal --> today - 3 months  Depression anxiety: -Tolerating Effexor 150 mg daily, doing rather well.  - Follow-up in 6 months   Reviewed expectations re: course of current medical issues.  Discussed self-management of symptoms.  Outlined signs and symptoms indicating need for more acute intervention.  Patient verbalized understanding and all questions were answered.  Patient received an After-Visit Summary.   electronically signed by:  Howard Pouch, DO  [place]

## 2017-03-15 NOTE — Patient Instructions (Signed)
Increase Amaryl to 2 mg with breakfast and 2 mg with dinner.  A1c 7.6--> 9.3 today.    I am glad you are feeling better.    Diabetes Mellitus and Nutrition When you have diabetes (diabetes mellitus), it is very important to have healthy eating habits because your blood sugar (glucose) levels are greatly affected by what you eat and drink. Eating healthy foods in the appropriate amounts, at about the same times every day, can help you:  Control your blood glucose.  Lower your risk of heart disease.  Improve your blood pressure.  Reach or maintain a healthy weight.  Every person with diabetes is different, and each person has different needs for a meal plan. Your health care provider may recommend that you work with a diet and nutrition specialist (dietitian) to make a meal plan that is best for you. Your meal plan may vary depending on factors such as:  The calories you need.  The medicines you take.  Your weight.  Your blood glucose, blood pressure, and cholesterol levels.  Your activity level.  Other health conditions you have, such as heart or kidney disease.  How do carbohydrates affect me? Carbohydrates affect your blood glucose level more than any other type of food. Eating carbohydrates naturally increases the amount of glucose in your blood. Carbohydrate counting is a method for keeping track of how many carbohydrates you eat. Counting carbohydrates is important to keep your blood glucose at a healthy level, especially if you use insulin or take certain oral diabetes medicines. It is important to know how many carbohydrates you can safely have in each meal. This is different for every person. Your dietitian can help you calculate how many carbohydrates you should have at each meal and for snack. Foods that contain carbohydrates include:  Bread, cereal, rice, pasta, and crackers.  Potatoes and corn.  Peas, beans, and lentils.  Milk and yogurt.  Fruit and  juice.  Desserts, such as cakes, cookies, ice cream, and candy.  How does alcohol affect me? Alcohol can cause a sudden decrease in blood glucose (hypoglycemia), especially if you use insulin or take certain oral diabetes medicines. Hypoglycemia can be a life-threatening condition. Symptoms of hypoglycemia (sleepiness, dizziness, and confusion) are similar to symptoms of having too much alcohol. If your health care provider says that alcohol is safe for you, follow these guidelines:  Limit alcohol intake to no more than 1 drink per day for nonpregnant women and 2 drinks per day for men. One drink equals 12 oz of beer, 5 oz of wine, or 1 oz of hard liquor.  Do not drink on an empty stomach.  Keep yourself hydrated with water, diet soda, or unsweetened iced tea.  Keep in mind that regular soda, juice, and other mixers may contain a lot of sugar and must be counted as carbohydrates.  What are tips for following this plan? Reading food labels  Start by checking the serving size on the label. The amount of calories, carbohydrates, fats, and other nutrients listed on the label are based on one serving of the food. Many foods contain more than one serving per package.  Check the total grams (g) of carbohydrates in one serving. You can calculate the number of servings of carbohydrates in one serving by dividing the total carbohydrates by 15. For example, if a food has 30 g of total carbohydrates, it would be equal to 2 servings of carbohydrates.  Check the number of grams (g) of saturated and  trans fats in one serving. Choose foods that have low or no amount of these fats.  Check the number of milligrams (mg) of sodium in one serving. Most people should limit total sodium intake to less than 2,300 mg per day.  Always check the nutrition information of foods labeled as "low-fat" or "nonfat". These foods may be higher in added sugar or refined carbohydrates and should be avoided.  Talk to your  dietitian to identify your daily goals for nutrients listed on the label. Shopping  Avoid buying canned, premade, or processed foods. These foods tend to be high in fat, sodium, and added sugar.  Shop around the outside edge of the grocery store. This includes fresh fruits and vegetables, bulk grains, fresh meats, and fresh dairy. Cooking  Use low-heat cooking methods, such as baking, instead of high-heat cooking methods like deep frying.  Cook using healthy oils, such as olive, canola, or sunflower oil.  Avoid cooking with butter, cream, or high-fat meats. Meal planning  Eat meals and snacks regularly, preferably at the same times every day. Avoid going long periods of time without eating.  Eat foods high in fiber, such as fresh fruits, vegetables, beans, and whole grains. Talk to your dietitian about how many servings of carbohydrates you can eat at each meal.  Eat 4-6 ounces of lean protein each day, such as lean meat, chicken, fish, eggs, or tofu. 1 ounce is equal to 1 ounce of meat, chicken, or fish, 1 egg, or 1/4 cup of tofu.  Eat some foods each day that contain healthy fats, such as avocado, nuts, seeds, and fish. Lifestyle   Check your blood glucose regularly.  Exercise at least 30 minutes 5 or more days each week, or as told by your health care provider.  Take medicines as told by your health care provider.  Do not use any products that contain nicotine or tobacco, such as cigarettes and e-cigarettes. If you need help quitting, ask your health care provider.  Work with a Social worker or diabetes educator to identify strategies to manage stress and any emotional and social challenges. What are some questions to ask my health care provider?  Do I need to meet with a diabetes educator?  Do I need to meet with a dietitian?  What number can I call if I have questions?  When are the best times to check my blood glucose? Where to find more information:  American Diabetes  Association: diabetes.org/food-and-fitness/food  Academy of Nutrition and Dietetics: PokerClues.dk  [employer] of Diabetes and Digestive and Kidney Diseases (NIH): ContactWire.be Summary  A healthy meal plan will help you control your blood glucose and maintain a healthy lifestyle.  Working with a diet and nutrition specialist (dietitian) can help you make a meal plan that is best for you.  Keep in mind that carbohydrates and alcohol have immediate effects on your blood glucose levels. It is important to count carbohydrates and to use alcohol carefully. This information is not intended to replace advice given to you by your health care provider. Make sure you discuss any questions you have with your health care provider. Document Released: 10/08/2004 Document Revised: 02/16/2016 Document Reviewed: 02/16/2016 Elsevier Interactive Patient Education  [employer].

## 2017-06-28 ENCOUNTER — Ambulatory Visit: Payer: Managed Care, Other (non HMO) | Admitting: Family Medicine

## 2017-07-01 ENCOUNTER — Ambulatory Visit: Payer: Managed Care, Other (non HMO) | Admitting: Family Medicine

## 2017-07-01 ENCOUNTER — Encounter: Payer: Self-pay | Admitting: Family Medicine

## 2017-07-01 VITALS — BP 131/80 | HR 106 | Temp 98.3°F | Resp 16 | Ht 66.0 in | Wt 249.0 lb

## 2017-07-01 DIAGNOSIS — J209 Acute bronchitis, unspecified: Secondary | ICD-10-CM

## 2017-07-01 MED ORDER — PREDNISONE 20 MG PO TABS: 40.0000 mg | ORAL_TABLET | Freq: Every day | ORAL | 0 refills | Status: DC

## 2017-07-01 MED ORDER — AMOXICILLIN-POT CLAVULANATE 875-125 MG PO TABS: 1.0000 | ORAL_TABLET | Freq: Two times a day (BID) | ORAL | 0 refills | Status: DC

## 2017-07-01 NOTE — Patient Instructions (Addendum)
Please reschedule your appointment for your chronic medical conditions as soon as possible. Please come fasting (no food-water only for 6-9 hours) to that appt so we can check your labs.   Rest, hydrate.  + flonase, mucinex (DM if cough), nettie pot or nasal saline.  Augmentin and prednisone  prescribed, take until completed.  If cough present it can last up to 6-8 weeks.  F/U 2 weeks of not improved.      Acute Bronchitis, Adult Acute bronchitis is when air tubes (bronchi) in the lungs suddenly get swollen. The condition can make it hard to breathe. It can also cause these symptoms:  A cough.  Coughing up clear, yellow, or green mucus.  Wheezing.  Chest congestion.  Shortness of breath.  A fever.  Body aches.  Chills.  A sore throat.  Follow these instructions at home: Medicines  Take over-the-counter and prescription medicines only as told by your doctor.  If you were prescribed an antibiotic medicine, take it as told by your doctor. Do not stop taking the antibiotic even if you start to feel better. General instructions  Rest.  Drink enough fluids to keep your pee (urine) clear or pale yellow.  Avoid smoking and secondhand smoke. If you smoke and you need help quitting, ask your doctor. Quitting will help your lungs heal faster.  Use an inhaler, cool mist vaporizer, or humidifier as told by your doctor.  Keep all follow-up visits as told by your doctor. This is important. How is this prevented? To lower your risk of getting this condition again:  Wash your hands often with soap and water. If you cannot use soap and water, use hand sanitizer.  Avoid contact with people who have cold symptoms.  Try not to touch your hands to your mouth, nose, or eyes.  Make sure to get the flu shot every year.  Contact a doctor if:  Your symptoms do not get better in 2 weeks. Get help right away if:  You cough up blood.  You have chest pain.  You have very bad  shortness of breath.  You become dehydrated.  You faint (pass out) or keep feeling like you are going to pass out.  You keep throwing up (vomiting).  You have a very bad headache.  Your fever or chills gets worse. This information is not intended to replace advice given to you by your health care provider. Make sure you discuss any questions you have with your health care provider. Document Released: 06/30/2007 Document Revised: 08/20/2015 Document Reviewed: 07/02/2015 Elsevier Interactive Patient Education  [employer].

## 2017-07-01 NOTE — Progress Notes (Signed)
Melissa Gray , 01-01-1976, 42 y.o., female MRN: [id number] Patient Care Team    Relationship Specialty Notifications Start End  Ma Hillock, DO PCP - General Family Medicine  06/03/15     Chief Complaint  Patient presents with  . URI    cough, congestion with greenish -yellow discharge     Subjective: Pt presents for an OV with complaints of cough of 9 days duration.  Associated symptoms include sore throat. Felt a little better 3 days ago, and then come on stronger. Vomit in the morning with cough. Feeling hot, sweating, but no temp. Nasal and chest congestion. Fatigue. COugh up yellow thick blood tinged phlegm.  Pt has tried mucinex to ease their symptoms.   Depression screen [facility] 2/9 03/15/2017 02/16/2017 12/09/2016 06/17/2016 05/12/2016  Decreased Interest 0 3 0 0 2  Down, Depressed, Hopeless 1 3 0 0 3  PHQ - 2 Score 1 6 0 0 5  Altered sleeping 1 3 0 0 2  Tired, decreased energy 1 3 0 0 3  Change in appetite 1 3 0 0 2  Feeling bad or failure about yourself  0 3 0 0 3  Trouble concentrating 0 3 - 0 1  Moving slowly or fidgety/restless 0 - 0 0 2  Suicidal thoughts 0 0 0 0 0  PHQ-9 Score 4 21 0 0 18  Difficult doing work/chores - - - - -    Allergies  Allergen Reactions  . Tape Other (See Comments)    blisters  . Levothyroxine Rash    Generic brand only caused rash   Social History   Tobacco Use  . Smoking status: Never Smoker  . Smokeless tobacco: Never Used  Substance Use Topics  . Alcohol use: Yes    Alcohol/week: 0.0 oz   Past Medical History:  Diagnosis Date  . Chicken pox   . Depression   . Diabetes mellitus without complication ([place])   . GERD (gastroesophageal reflux disease)   . Thyroid disease    Past Surgical History:  Procedure Laterality Date  . CHOLECYSTECTOMY  2006  . CYST EXCISION    . GLOMUS TUMOR EXCISION  2005, 2012   finger and cheek   Family History  Problem Relation Age of Onset  . Diabetes Mother   . COPD Father   . Heart  disease Father   . Early death Father   . Diabetes Brother   . Alcohol abuse Maternal Aunt   . Alzheimer's disease Maternal Aunt   . Breast cancer Maternal Aunt   . Diabetes Maternal Grandmother   . Hearing loss Maternal Grandmother   . Early death Maternal Grandfather   . Cancer Maternal Grandfather   . Breast cancer Paternal Grandmother   . Heart disease Paternal Grandfather   . Breast cancer Paternal Aunt    Allergies as of 07/01/2017      Reactions   Tape Other (See Comments)   blisters   Levothyroxine Rash   Generic brand only caused rash      Medication List        Accurate as of 07/01/17  8:09 AM. Always use your most recent med list.          fluticasone 50 MCG/ACT nasal spray Commonly known as:  FLONASE   glimepiride 2 MG tablet Commonly known as:  AMARYL 2 mg before breakfast and dinner   guaiFENesin 600 MG 12 hr tablet Commonly known as:  MUCINEX Take by mouth 2 (two) times  daily.   sitaGLIPtin 100 MG tablet Commonly known as:  JANUVIA Take 1 tablet (100 mg total) by mouth daily.   venlafaxine XR 150 MG 24 hr capsule Commonly known as:  EFFEXOR XR Take 1 capsule (150 mg total) by mouth daily with breakfast. Needs appt prior to anymore refills       All past medical history, surgical history, allergies, family history, immunizations andmedications were updated in the EMR today and reviewed under the history and medication portions of their EMR.     ROS: Negative, with the exception of above mentioned in HPI   Objective:  BP 131/80 (BP Location: Left Arm, Patient Position: Sitting, Cuff Size: Normal)   Pulse (!) 106   Temp 98.3 F (36.8 C) (Oral)   Resp 16   Ht 5\' 6"  (1.676 m)   Wt 249 lb (112.9 kg)   SpO2 95%   BMI 40.19 kg/m  Body mass index is 40.19 kg/m. Gen: Afebrile. No acute distress. Nontoxic in appearance, well developed, well nourished.  HENT: AT. . Bilateral TM visualized with erythema or buldging. Tacky MM, no oral lesions.  Bilateral nares with mild erythema, drainage present. Throat without erythema or exudates. Cough, hoarseness present Eyes:Pupils Equal Round Reactive to light, Extraocular movements intact,  Conjunctiva without redness, discharge or icterus. Neck/lymp/endocrine: Supple,no lymphadenopathy CV: RRR (mild tachy) Chest: CTAB, no wheeze or crackles. Good air movement, normal resp effort. Cough with inspiration Abd: Soft. obese. NTND. BS present. no Masses palpated.  Skin: no rashes, purpura or petechiae.  Neuro:  Normal gait. PERLA. EOMi. Alert. Oriented x3  No exam data present No results found. No results found for this or any previous visit (from the past 24 hour(s)).  Assessment/Plan: Melissa Gray is a 42 y.o. female present for OV for   Acute bronchitis, unspecified organism Rest, hydrate.  + flonase, mucinex (DM if cough), nettie pot or nasal saline.  Augmentin and prednisone  prescribed, take until completed.  If cough present it can last up to 6-8 weeks.  F/U 2 weeks of not improved.    Reviewed expectations re: course of current medical issues.  Discussed self-management of symptoms.  Outlined signs and symptoms indicating need for more acute intervention.  Patient verbalized understanding and all questions were answered.  Patient received an After-Visit Summary.    No orders of the defined types were placed in this encounter.    Note is dictated utilizing voice recognition software. Although note has been proof read prior to signing, occasional typographical errors still can be missed. If any questions arise, please do not hesitate to call for verification.   electronically signed by:  Howard Pouch, DO  [place]

## 2017-08-05 ENCOUNTER — Ambulatory Visit: Payer: Managed Care, Other (non HMO) | Admitting: Family Medicine

## 2017-08-31 ENCOUNTER — Encounter: Payer: Self-pay | Admitting: Family Medicine

## 2017-08-31 ENCOUNTER — Ambulatory Visit: Payer: Managed Care, Other (non HMO) | Admitting: Family Medicine

## 2017-08-31 VITALS — BP 122/84 | HR 79 | Temp 98.0°F | Resp 20 | Ht 66.0 in | Wt 246.0 lb

## 2017-08-31 DIAGNOSIS — E1165 Type 2 diabetes mellitus with hyperglycemia: Secondary | ICD-10-CM | POA: Diagnosis not present

## 2017-08-31 DIAGNOSIS — F418 Other specified anxiety disorders: Secondary | ICD-10-CM

## 2017-08-31 DIAGNOSIS — E781 Pure hyperglyceridemia: Secondary | ICD-10-CM

## 2017-08-31 DIAGNOSIS — IMO0001 Reserved for inherently not codable concepts without codable children: Secondary | ICD-10-CM

## 2017-08-31 LAB — POCT GLYCOSYLATED HEMOGLOBIN (HGB A1C): HbA1c, POC (controlled diabetic range): 9.6 % — AB (ref 0.0–7.0)

## 2017-08-31 LAB — LIPID PANEL
CHOL/HDL RATIO: 7
Cholesterol: 211 mg/dL — ABNORMAL HIGH (ref 0–200)
HDL: 28.8 mg/dL — ABNORMAL LOW (ref >39.00)
NonHDL: 181.84
Triglycerides: 280 mg/dL — ABNORMAL HIGH (ref 0.0–149.0)
VLDL: 56 mg/dL — AB (ref 0.0–40.0)

## 2017-08-31 LAB — BASIC METABOLIC PANEL
BUN: 8 mg/dL (ref 6–23)
CHLORIDE: 100 meq/L (ref 96–112)
CO2: 31 mEq/L (ref 19–32)
Calcium: 9.4 mg/dL (ref 8.4–10.5)
Creatinine, Ser: 0.66 mg/dL (ref 0.40–1.20)
GFR: 105.54 mL/min (ref >60.00)
GLUCOSE: 289 mg/dL — AB (ref 70–99)
POTASSIUM: 4.1 meq/L (ref 3.5–5.1)
Sodium: 137 mEq/L (ref 135–145)

## 2017-08-31 LAB — LDL CHOLESTEROL, DIRECT: LDL DIRECT: 149 mg/dL

## 2017-08-31 LAB — TSH: TSH: 2.79 u[IU]/mL (ref 0.35–4.50)

## 2017-08-31 MED ORDER — GLIMEPIRIDE 2 MG PO TABS: ORAL_TABLET | ORAL | 1 refills | Status: DC

## 2017-08-31 MED ORDER — VENLAFAXINE HCL ER 150 MG PO CP24: 150.0000 mg | ORAL_CAPSULE | Freq: Every day | ORAL | 1 refills | Status: DC

## 2017-08-31 MED ORDER — SITAGLIPTIN PHOSPHATE 100 MG PO TABS: 100.0000 mg | ORAL_TABLET | Freq: Every day | ORAL | 1 refills | Status: DC

## 2017-08-31 NOTE — Progress Notes (Signed)
Melissa Gray , 01/28/1975, 42 y.o., female MRN: [id number] Patient Care Team    Relationship Specialty Notifications Start End  Ma Hillock, DO PCP - General Family Medicine  06/03/15    Chief Complaint  Patient presents with  . Diabetes    Subjective:  Uncontrolled type 2 diabetes mellitus without complication, without long-term current use of insulin (HCC)/Morbid obesity ([place]) Patient reports forgetting januvia dose frequently because it is at night, she also forgets the 2nd dose of amaryl for same reason. She still not monitoring her blood glucose levels. Patient denies dizziness, hyperglycemic or hypoglycemic events. Patient denies numbness, tingling in the extremities or nonhealing wounds of feet.  She is not able to tolerate any type of metformin without severe GI side effects. - Eye exam 03/01/2017 - Foot exam completed 03/15/17 - Prevnar administered 6/7//2017; PNA23 completed 05/28/2016 - flu shot encouraged yearly - BMP normal last collection, due on next visit. - microalbumin collected 03/15/2017  Depression with anxiety Patient reports she is doing rather well right now on the Effexor 150 mg daily. Denies side effects. PHQ completed.    Prior note 05/12/2016: Pt presents for an OV with complaints of worsening depression and anxiety of the last 6 months. She reports she has had depression and anxiety problems throughout her entire life. She has not talked about it or seen a provider for it in the past. She has never been on medications for this issue. She endorses becoming more emotional, breaking out in tears frequently, lack of motivation to do anything including bath, be social, do choirs., take her medicines and take care of her diabetes. She feels she has been snapping at her husband. She feels she is putting all the choirs/house responsibilities on him and it makes her feel guilty. She feels he has been a great support. She has been unable to sleep well secondary to  worrying and thinking instead of sleeping at bedtime. She thinks of "bad outcomes" of everyday subjects. She reports there is nothing in particular that causing her to feel this way, it is "just everything". She states she makes plans with her friends and then cancels a few hours prior. She knows she will have fun if she goes, but she is fearful she will have a panic attack. She reports having a few panic attacks recently, prior to that her last panic attack was about 5 years ago.   Mood disorder: negative today.  Depression screen [facility] 2/9 08/31/2017 03/15/2017 02/16/2017 12/09/2016 06/17/2016  Decreased Interest 0 0 3 0 0  Down, Depressed, Hopeless 0 1 3 0 0  PHQ - 2 Score 0 1 6 0 0  Altered sleeping 1 1 3  0 0  Tired, decreased energy 2 1 3  0 0  Change in appetite 0 1 3 0 0  Feeling bad or failure about yourself  0 0 3 0 0  Trouble concentrating 0 0 3 - 0  Moving slowly or fidgety/restless 0 0 - 0 0  Suicidal thoughts 0 0 0 0 0  PHQ-9 Score 3 4 21  0 0  Difficult doing work/chores Not difficult at all - - - -   GAD 7 : Generalized Anxiety Score 03/15/2017 02/16/2017 12/09/2016 05/12/2016  Nervous, Anxious, on Edge 1 3 0 2  Control/stop worrying 1 3 0 2  Worry too much - different things 1 3 0 3  Trouble relaxing 1 3 0 2  Restless 0 3 0 2  Easily annoyed or irritable 1 3  0 3  Afraid - awful might happen 1 3 0 3  Total GAD 7 Score 6 21 0 17  Anxiety Difficulty - Extremely difficult - Extremely difficult     Allergies  Allergen Reactions  . Tape Other (See Comments)    blisters  . Levothyroxine Rash    Generic brand only caused rash   Social History   Tobacco Use  . Smoking status: Never Smoker  . Smokeless tobacco: Never Used  Substance Use Topics  . Alcohol use: Yes    Alcohol/week: 0.0 oz   Past Medical History:  Diagnosis Date  . Chicken pox   . Depression   . Diabetes mellitus without complication ([place])   . GERD (gastroesophageal reflux disease)   . Thyroid disease     Past Surgical History:  Procedure Laterality Date  . CHOLECYSTECTOMY  2006  . CYST EXCISION    . GLOMUS TUMOR EXCISION  2005, 2012   finger and cheek   Family History  Problem Relation Age of Onset  . Diabetes Mother   . COPD Father   . Heart disease Father   . Early death Father   . Diabetes Brother   . Alcohol abuse Maternal Aunt   . Alzheimer's disease Maternal Aunt   . Breast cancer Maternal Aunt   . Diabetes Maternal Grandmother   . Hearing loss Maternal Grandmother   . Early death Maternal Grandfather   . Cancer Maternal Grandfather   . Breast cancer Paternal Grandmother   . Heart disease Paternal Grandfather   . Breast cancer Paternal Aunt    Allergies as of 08/31/2017      Reactions   Tape Other (See Comments)   blisters   Levothyroxine Rash   Generic brand only caused rash      Medication List        Accurate as of 08/31/17  8:07 AM. Always use your most recent med list.          fluticasone 50 MCG/ACT nasal spray Commonly known as:  FLONASE   glimepiride 2 MG tablet Commonly known as:  AMARYL 2 mg before breakfast and dinner   sitaGLIPtin 100 MG tablet Commonly known as:  JANUVIA Take 1 tablet (100 mg total) by mouth daily.   venlafaxine XR 150 MG 24 hr capsule Commonly known as:  EFFEXOR XR Take 1 capsule (150 mg total) by mouth daily with breakfast. Needs appt prior to anymore refills       No results found for this or any previous visit (from the past 24 hour(s)). No results found.   ROS: Negative, with the exception of above mentioned in HPI   Objective:  BP 122/84 (BP Location: Left Arm, Patient Position: Sitting, Cuff Size: Large)   Pulse 79   Temp 98 F (36.7 C)   Resp 20   Ht 5\' 6"  (1.676 m)   Wt 246 lb (111.6 kg)   SpO2 97%   BMI 39.71 kg/m  Body mass index is 39.71 kg/m. Gen: Afebrile. No acute distress. Obese, caucasian female.  HENT: AT. [place]. MMM. Eyes:Pupils Equal Round Reactive to light, Extraocular movements  intact,  Conjunctiva without redness, discharge or icterus. Neck/lymp/endocrine: Supple,no lymphadenopathy, no thyromegaly CV: RRR no murmur, no edema, +2/4 P posterior tibialis pulses Chest: CTAB, no wheeze or crackles Abd: Soft. obese. NTND. BS present.  Neuro:  Normal gait. PERLA. EOMi. Alert. Oriented x3  Psych: Normal affect, dress and demeanor. Normal speech. Normal thought content and judgment   No  results found for this or any previous visit (from the past 24 hour(s)).   Assessment/Plan: Melissa Gray is a 42 y.o. female present for OV for  Uncontrolled type 2 diabetes mellitus without complication, without long-term current use of insulin ([place]) Morbid obesity ([place]) -  Worsening. She is not compliant with medications. She is forgetting evening doses. She is not watching her diet well. She is not exercising. A1c 9.6--> 7.8 --> 9.3--> 9.6.  - continue Januvia 100 mg daily. Checking with pharm to see if she can take in the day instead.  -  increase Amaryl to 3 mg before breakfast, then 4 mg before breakfast if can tolerate (if not she is to coninte to split dose for a total of 4 mg a day.  - Can not adjust doses until she is compliant with meds and we can gauge what current doses effect would be.  - start monitoring fasting sugars - flu shot encouraged yearly - Eye exam UTD 03/26/2015, [place]--> 03/01/2017 awaiting records. - Foot exam completed today - Prevnar administered 6/7//2017; PNA23 completed 06/17/2016 - microalbumin collected 08/21/2015- normal --> 03/15/2017 - 3-4 months  Depression anxiety: - Stable.  -  Effexor 150 mg daily, doing rather well. Refills provided.   - Follow-up in 6 months   Reviewed expectations re: course of current medical issues.  Discussed self-management of symptoms.  Outlined signs and symptoms indicating need for more acute intervention.  Patient verbalized understanding and all questions were answered.  Patient received an After-Visit  Summary.   electronically signed by:  Howard Pouch, DO  [place]

## 2017-08-31 NOTE — Patient Instructions (Addendum)
make sure to take medicine daily.  Ask pharmacy  about taking this in morning Start 1.5 tabs of amaryl in the morning and do ok, then take both tabs in morning. Make sure to get a full 4 mg daily. Refills provided today.  If a1c rises again next time, we will need to consider increasing medications or start injectables. Get back on track with your diet. Lets get this a1c below 7. You got this.    Diabetes Mellitus and Exercise Exercising regularly is important for your overall health, especially when you have diabetes (diabetes mellitus). Exercising is not only about losing weight. It has many health benefits, such as increasing muscle strength and bone density and reducing body fat and stress. This leads to improved fitness, flexibility, and endurance, all of which result in better overall health. Exercise has additional benefits for people with diabetes, including:  Reducing appetite.  Helping to lower and control blood glucose.  Lowering blood pressure.  Helping to control amounts of fatty substances (lipids) in the blood, such as cholesterol and triglycerides.  Helping the body to respond better to insulin (improving insulin sensitivity).  Reducing how much insulin the body needs.  Decreasing the risk for heart disease by: ? Lowering cholesterol and triglyceride levels. ? Increasing the levels of good cholesterol. ? Lowering blood glucose levels.  What is my activity plan? Your health care provider or certified diabetes educator can help you make a plan for the type and frequency of exercise (activity plan) that works for you. Make sure that you:  Do at least 150 minutes of moderate-intensity or vigorous-intensity exercise each week. This could be brisk walking, biking, or water aerobics. ? Do stretching and strength exercises, such as yoga or weightlifting, at least 2 times a week. ? Spread out your activity over at least 3 days of the week.  Get some form of physical activity  every day. ? Do not go more than 2 days in a row without some kind of physical activity. ? Avoid being inactive for more than 90 minutes at a time. Take frequent breaks to walk or stretch.  Choose a type of exercise or activity that you enjoy, and set realistic goals.  Start slowly, and gradually increase the intensity of your exercise over time.  What do I need to know about managing my diabetes?  Check your blood glucose before and after exercising. ? If your blood glucose is higher than 240 mg/dL (13.3 mmol/L) before you exercise, check your urine for ketones. If you have ketones in your urine, do not exercise until your blood glucose returns to normal.  Know the symptoms of low blood glucose (hypoglycemia) and how to treat it. Your risk for hypoglycemia increases during and after exercise. Common symptoms of hypoglycemia can include: ? Hunger. ? Anxiety. ? Sweating and feeling clammy. ? Confusion. ? Dizziness or feeling light-headed. ? Increased heart rate or palpitations. ? Blurry vision. ? Tingling or numbness around the mouth, lips, or tongue. ? Tremors or shakes. ? Irritability.  Keep a rapid-acting carbohydrate snack available before, during, and after exercise to help prevent or treat hypoglycemia.  Avoid injecting insulin into areas of the body that are going to be exercised. For example, avoid injecting insulin into: ? The arms, when playing tennis. ? The legs, when jogging.  Keep records of your exercise habits. Doing this can help you and your health care provider adjust your diabetes management plan as needed. Write down: ? Food that you eat before  and after you exercise. ? Blood glucose levels before and after you exercise. ? The type and amount of exercise you have done. ? When your insulin is expected to peak, if you use insulin. Avoid exercising at times when your insulin is peaking.  When you start a new exercise or activity, work with your health care  provider to make sure the activity is safe for you, and to adjust your insulin, medicines, or food intake as needed.  Drink plenty of water while you exercise to prevent dehydration or heat stroke. Drink enough fluid to keep your urine clear or pale yellow. This information is not intended to replace advice given to you by your health care provider. Make sure you discuss any questions you have with your health care provider. Document Released: 04/03/2003 Document Revised: 08/01/2015 Document Reviewed: 06/23/2015 Elsevier Interactive Patient Education  2018 [employer].

## 2017-09-01 ENCOUNTER — Telehealth: Payer: Self-pay | Admitting: Family Medicine

## 2017-09-01 MED ORDER — ATORVASTATIN CALCIUM 40 MG PO TABS: 40.0000 mg | ORAL_TABLET | Freq: Every day | ORAL | 3 refills | Status: DC

## 2017-09-01 NOTE — Telephone Encounter (Signed)
Please inform patient the following information: Her glucose level at the time of visit was 289. I strongly encourage her to try to make the changes to her medication timing and/or set phone alarms for medication times. If her A1c continues to rise we will need to refer her to endocrine for help managing her diabetes.  Her thyroid is functioning normal.  Her cholesterol is elevated. With her diabetes condition and her level of cholesterol a statin medication is strongly recommended by guidelines of the American heart association, to provide her some cardiovascular protection from stroke and MI.  - I have called in a medication called Lipitor for her to start.  - we will recheck her cholesterol and LFT at her next routine appt in 3 months--> please make sure she scheduled.

## 2017-09-01 NOTE — Telephone Encounter (Signed)
Spoke with patient reviewed lab results and instructions. Patient verbalized understanding.patient has follow up scheduled in November.

## 2017-10-25 DIAGNOSIS — K921 Melena: Secondary | ICD-10-CM

## 2017-10-25 HISTORY — DX: Melena: K92.1

## 2017-11-08 ENCOUNTER — Ambulatory Visit: Payer: Self-pay

## 2017-11-08 NOTE — Telephone Encounter (Signed)
Incoming call from patient with complaint of blood in the stool.  Patient states she was constipated on Friday . On Saturday she reports having a  water stool x1.   Toilet water was bright red color   Stopped for 3 days. Had a small stool today. Toilet water was bright red today.  Patient refers to it as a liquid stool, not continuous stools.  Denies taking blood thinners.  Denies pain, , dizziness  Patient states she is at the end of her menstral cycle. Patient does state that she has a hemorrhoid. Does not feel the bleeding is from it.  Provided care advice, voiced understanding. Patient scheduled for an appointment on 11/09/17 at 2:30pm with Dr.  Anitra Lauth. Patient voiced  understanding.     Reason for Disposition . MODERATE rectal bleeding (small blood clots, passing blood without stool, or toilet water turns red)  Answer Assessment - Initial Assessment Questions 1. APPEARANCE of BLOOD: "What color is it?" "Is it passed separately, on the surface of the stool, or mixed in with the stool?"      *No Answer* 2. AMOUNT: "How much blood was passed?"      *No Answer* 3. FREQUENCY: "How many times has blood been passed with the stools?"      *No Answer* one time 4. ONSET: "When was the blood first seen in the stools?" (Days or weeks)       saturday 5. DIARRHEA: "Is there also some diarrhea?" If so, ask: "How many diarrhea stools were passed in past 24 hours?"       Liquid stoo 6. CONSTIPATION: "Do you have constipation?" If so, "How bad is it?"     friday 7. RECURRENT SYMPTOMS: "Have you had blood in your stools before?" If so, ask: "When was the last time?" and "What happened that time?"      no 8. BLOOD THINNERS: "Do you take any blood thinners?" (e.g., Coumadin/warfarin, Pradaxa/dabigatran, aspirin)     no 9. OTHER SYMPTOMS: "Do you have any other symptoms?"  (e.g., abdominal pain, vomiting, dizziness, fever)     No pain , no dizziness 10. PREGNANCY: "Is there any chance you are pregnant?"  "When was your last menstrual period?"        End of cycle  Protocols used: RECTAL BLEEDING-A-AH

## 2017-11-09 ENCOUNTER — Ambulatory Visit: Payer: Managed Care, Other (non HMO) | Admitting: Family Medicine

## 2017-11-09 ENCOUNTER — Encounter: Payer: Self-pay | Admitting: Family Medicine

## 2017-11-09 ENCOUNTER — Other Ambulatory Visit: Payer: Self-pay

## 2017-11-09 VITALS — BP 127/87 | HR 88 | Temp 98.3°F | Resp 16 | Ht 66.0 in | Wt 242.0 lb

## 2017-11-09 DIAGNOSIS — K921 Melena: Secondary | ICD-10-CM | POA: Diagnosis not present

## 2017-11-09 DIAGNOSIS — K625 Hemorrhage of anus and rectum: Secondary | ICD-10-CM

## 2017-11-09 NOTE — Progress Notes (Signed)
OFFICE VISIT  11/09/2017   CC:  Chief Complaint  Patient presents with  . Blood In Stools   HPI:    Patient is a 42 y.o.  female who presents for "blood in stool". Four days ago she had a loose/watery BM that had a lot of bright red blood in it.   Had another yesterday that she showed me a picture of today. No BMs in between. No pain during passing the BM or after.  The bm's were, however, quite urgent. Her normal bowel habits are one daily, soft, formed stool.  However, the day prior to her first bloody bm she did have to strain more to get a harder stool to come out.  No tearing sensation with the BM. Today feels a bit of "rolling" sensation in lower abd, like she may have to have another BM.  No fevers.  Her newest med is her statin: she started this about 2 mo ago. No recent new otc or herbal meds. No NSAIDs.  No n/v.   Her menses started 7 d/a, mostly ended now.  She says she had a tampon in when she had her bloody BMs recently.  Past Medical History:  Diagnosis Date  . Chicken pox   . Depression   . Diabetes mellitus without complication ([place])   . GERD (gastroesophageal reflux disease)   . Thyroid disease     Past Surgical History:  Procedure Laterality Date  . CHOLECYSTECTOMY  2006  . CYST EXCISION    . GLOMUS TUMOR EXCISION  2005, 2012   finger and cheek    Outpatient Medications Prior to Visit  Medication Sig Dispense Refill  . atorvastatin (LIPITOR) 40 MG tablet Take 1 tablet (40 mg total) by mouth daily. 90 tablet 3  . glimepiride (AMARYL) 2 MG tablet 4 mg before breakfas 180 tablet 1  . sitaGLIPtin (JANUVIA) 100 MG tablet Take 1 tablet (100 mg total) by mouth daily. 90 tablet 1  . venlafaxine XR (EFFEXOR-XR) 150 MG 24 hr capsule Take 1 capsule (150 mg total) by mouth daily with breakfast. Needs appt prior to anymore refills 90 capsule 1  . fluticasone (FLONASE) 50 MCG/ACT nasal spray      No facility-administered medications prior to visit.      Allergies  Allergen Reactions  . Tape Other (See Comments)    blisters  . Levothyroxine Rash    Generic brand only caused rash    ROS As per HPI  PE: Blood pressure 127/87, pulse 88, temperature 98.3 F (36.8 C), temperature source Oral, resp. rate 16, height 5\' 6"  (1.676 m), weight 242 lb (109.8 kg), SpO2 96 %.  Exam chaperoned by Starla Link, CMA.  Gen: Alert, well appearing.  Patient is oriented to person, place, time, and situation. AFFECT: pleasant, lucid thought and speech. VZD:GLOV: no injection, icteris, swelling, or exudate.  EOMI, PERRLA. Mouth: lips without lesion/swelling.  Oral mucosa pink and moist. Oropharynx without erythema, exudate, or swelling.  CV: RRR, no m/r/g.   LUNGS: CTA bilat, nonlabored resps, good aeration in all lung fields. ABD: soft, ND.  Mild discomfort to palpation diffusely w/out guarding or rebound.  BS normal.  No bruit, no HSM or mass. Anal exam: multiple skin tags around anal opening.  Several small, non-inflamed external hemorrhoids--pretty small.  No fissure, no blood, no maceration of skin anywhere.  Digital rectal exam: no mass, no stool palpable, no blood or stool on gloved finger.   LABS:    Chemistry  Component Value Date/Time   NA 137 08/31/2017 0834   K 4.1 08/31/2017 0834   CL 100 08/31/2017 0834   CO2 31 08/31/2017 0834   BUN 8 08/31/2017 0834   CREATININE 0.66 08/31/2017 0834      Component Value Date/Time   CALCIUM 9.4 08/31/2017 0834   ALKPHOS 80 06/27/2015 0802   AST 36 06/27/2015 0802   ALT 59 (H) 06/27/2015 0802   BILITOT 0.7 06/27/2015 0802     Lab Results  Component Value Date   WBC 7.4 06/03/2015   HGB 13.2 06/03/2015   HCT 39.6 06/03/2015   MCV 83.7 06/03/2015   PLT 207.0 06/03/2015   Lab Results  Component Value Date   HGBA1C 9.6 (A) 08/31/2017    IMPRESSION AND PLAN:  Hematochezia: suspect internal hemorrhoidal bleeding. Check CBC. Refer to GI. Signs/symptoms to call or return for were  reviewed and pt expressed understanding.   An After Visit Summary was printed and given to the patient.  FOLLOW UP: Return if symptoms worsen or fail to improve.  Signed:  Crissie Sickles, MD           11/09/2017

## 2017-11-10 ENCOUNTER — Encounter: Payer: Self-pay | Admitting: Gastroenterology

## 2017-11-10 ENCOUNTER — Telehealth: Payer: Self-pay | Admitting: Family Medicine

## 2017-11-10 LAB — CBC
HEMATOCRIT: 42.7 % (ref 36.0–46.0)
HEMOGLOBIN: 14.1 g/dL (ref 12.0–15.0)
MCHC: 32.9 g/dL (ref 30.0–36.0)
MCV: 86.4 fl (ref 78.0–100.0)
PLATELETS: 201 10*3/uL (ref 150.0–400.0)
RBC: 4.95 Mil/uL (ref 3.87–5.11)
RDW: 17 % — ABNORMAL HIGH (ref 11.5–15.5)
WBC: 7.9 10*3/uL (ref 4.0–10.5)

## 2017-11-10 NOTE — Telephone Encounter (Signed)
Copied from [place] [id number]. Topic: Quick Communication - See Telephone Encounter >> Nov 10, 2017  3:50 PM Vernona Rieger wrote: CRM for notification. See Telephone encounter for: 11/10/17.  Patient is stating she would like her blood results from yesterday 10/16. Please contact patient, [phone] She states that the GI office just reached out to her to make an appointment.

## 2017-11-10 NOTE — Telephone Encounter (Signed)
Lab results not in yet, advised pt that we will call when results come in, she voiced understanding.

## 2017-11-11 ENCOUNTER — Encounter: Payer: Self-pay | Admitting: *Deleted

## 2017-11-14 ENCOUNTER — Encounter: Payer: Self-pay | Admitting: Gastroenterology

## 2017-11-14 ENCOUNTER — Ambulatory Visit: Payer: Managed Care, Other (non HMO) | Admitting: Gastroenterology

## 2017-11-14 VITALS — BP 120/78 | HR 90 | Ht 66.0 in | Wt 242.0 lb

## 2017-11-14 DIAGNOSIS — K921 Melena: Secondary | ICD-10-CM | POA: Diagnosis not present

## 2017-11-14 DIAGNOSIS — K580 Irritable bowel syndrome with diarrhea: Secondary | ICD-10-CM | POA: Diagnosis not present

## 2017-11-14 MED ORDER — SOD PICOSULFATE-MAG OX-CIT ACD 10-3.5-12 MG-GM -GM/160ML PO SOLN: 1.0000 | ORAL | 0 refills | Status: DC

## 2017-11-14 NOTE — Telephone Encounter (Signed)
Please advise. Thanks.  

## 2017-11-14 NOTE — Progress Notes (Signed)
Chief Complaint: Hematochezia   Referring Provider:     Antonieta Pert, MD    HPI:     Melissa Gray is a 42 y.o. female referred to the [facility] for evaluation of recent onset hematochezia.  She states she has had 2 episodes of urgency and BRBPR over the last week. Then no BM in between episodes. Does note constipation prior to sxs onset. No prior similar sxs. No abdominal pain, dyschezia. No f/c. No associated CP, SOB, DOE, headaches. Last BM was yesterday and no blood.   No previous colonoscopy or EGD. No known family history of CRC, GI malignancy, liver disease, pancreatic disease, or IBD. Patient is otherwise without preceding exposures to include recent antibiotics, hospitalization, sick contacts, travel and denies new medications, supplements, OTCs.  She was seen by her PCM on 11/09/2017.  Rectal exam notable for multiple skin tags, several small noninflamed external hemorrhoids without fissure and DRE without mass or blood.  CBC was essentially normal and was referred to GI for further evaluation.  Otherwise no recent abdominal imaging for review.  Separately, she notes a longer standing hx of post prandial urgency/BM with loose to watery, non-bloody stools. Has long hx of alternating BMs, but diarrhea predominant. 2-3 BM/day. Tried food diary without any exacerbating foods noted. No food avoidances. Endorses intermittent nausea without emesis, although previously had hx of vomiting and ws treated with "anti inflammatory". Worries about eating in public for fear of post prandial BMs. Tried fiber supplements; unsure of efficacy. Fearful of any meds for this.  Symptoms worse with increased stress/anxiety.  Past Medical History:  Diagnosis Date  . Chicken pox   . Depression   . Diabetes mellitus without complication ([place])   . GERD (gastroesophageal reflux disease)   . Thyroid disease      Past Surgical History:  Procedure Laterality Date  .  CHOLECYSTECTOMY  2006  . CYST EXCISION    . GLOMUS TUMOR EXCISION  2005, 2012   finger and cheek   Family History  Problem Relation Age of Onset  . Diabetes Mother   . COPD Father   . Heart disease Father   . Early death Father   . Diabetes Brother   . Alcohol abuse Maternal Aunt   . Alzheimer's disease Maternal Aunt   . Breast cancer Maternal Aunt   . Diabetes Maternal Grandmother   . Hearing loss Maternal Grandmother   . Early death Maternal Grandfather   . Cancer Maternal Grandfather   . Breast cancer Paternal Grandmother   . Heart disease Paternal Grandfather   . Breast cancer Paternal Aunt    Social History   Tobacco Use  . Smoking status: Never Smoker  . Smokeless tobacco: Never Used  Substance Use Topics  . Alcohol use: Yes    Alcohol/week: 0.0 standard drinks    Comment: ocassionally  . Drug use: No   Current Outpatient Medications  Medication Sig Dispense Refill  . atorvastatin (LIPITOR) 40 MG tablet Take 1 tablet (40 mg total) by mouth daily. 90 tablet 3  . fluticasone (FLONASE) 50 MCG/ACT nasal spray Place 1 spray into both nostrils as needed.     Marland Kitchen glimepiride (AMARYL) 2 MG tablet 4 mg before breakfas 180 tablet 1  . sitaGLIPtin (JANUVIA) 100 MG tablet Take 1 tablet (100 mg total) by mouth daily. 90 tablet 1  . venlafaxine XR (EFFEXOR-XR) 150 MG 24 hr capsule Take 1 capsule (  150 mg total) by mouth daily with breakfast. Needs appt prior to anymore refills 90 capsule 1   No current facility-administered medications for this visit.    Allergies  Allergen Reactions  . Tape Other (See Comments)    Adhesive tape causes blisters  . Levothyroxine Rash    Generic brand only caused rash     Review of Systems: All systems reviewed and negative except where noted in HPI.     Physical Exam:    Wt Readings from Last 3 Encounters:  11/14/17 242 lb (109.8 kg)  11/09/17 242 lb (109.8 kg)  08/31/17 246 lb (111.6 kg)    Ht 5\' 6"  (1.676 m)   Wt 242 lb (109.8  kg)   BMI 39.06 kg/m  Constitutional:  Pleasant, in no acute distress. Psychiatric: Normal mood and affect. Behavior is normal. EENT: Pupils normal.  Conjunctivae are normal. No scleral icterus. Neck supple. No cervical LAD. Cardiovascular: Normal rate, regular rhythm. No edema Pulmonary/chest: Effort normal and breath sounds normal. No wheezing, rales or rhonchi. Abdominal: Soft, nondistended, nontender. Bowel sounds active throughout. There are no masses palpable. No hepatomegaly. Neurological: Alert and oriented to person place and time. Skin: Skin is warm and dry. No rashes noted. Rectal: Exam deferred by patient to time of colonoscopy.    ASSESSMENT AND PLAN;   Melissa Gray is a 42 y.o. female presenting with:   1) Hematochezia: Clinical presentation seems most consistent with benign anal rectal bleeding (i.e. hemorrhoids) and will proceed as below:  -Increase dietary fiber and add fiber supplement for goal of 25 g of fiber per day with goal for soft, formed stools without straining to have a BM -Increase daily fluid intake with at least 8 glasses of 8 ounces of water -Avoid straining to have a BM -Avoid prolonged time on the toilet -Given clinical presentation, recent abrupt onset of large volume BRBPR, association with change in bowel habits, bothersome nature of symptoms, and patient concerns, will evaluate for additional mucosal or luminal etiology for bleeding with colonoscopy.  We will also evaluate at that time for presence and severity of hemorrhoids. -If clinically significant internal hemorrhoids present on colonoscopy, we briefly discussed further treatment with hemorrhoid band ligation at this appointment -Otherwise, no hemodynamic changes or anemia due to hematochezia to warrant hospital admission, blood products, urgent endoscopy, etc. -Return to clinic following colonoscopy  2) IBS-D:  Clinical presentation seems most consistent with irritable bowel syndrome,  diarrhea predominant.  Discussed the pathophysiology of IBS at length along with dietary, and lifestyle modifications and medical management options and will proceed as below:  - Provided handout on low FODMAP diet with detailed explanation - Ensure intake of at least 8 glasses of 8 ounces of fluid/water per day - Okay to take Imodium as needed, particularly with situational exacerbation of symptoms.  Depending on response to therapy and ongoing need for medical intervention, can consider trial of Motofen - Encouraged to keep a diet diary - Increase dietary fiber and add fiber supplement (i.e. Citrucel, Benefiber, Metamucil) for goal of 25 g of fiber per day with goal for soft, formed stools without straining to have a BM - Discussed Bentyl or Symax to take as needed for abdominal pain/cramping - Discussed neuromodulation with TCA, SSRI, SNRI, etc. Already taking Effexor.  - We will plan on colonoscopy to evaluate for alternate mucosal or luminal etiology to include biopsies to evaluate for Microscopic Colitis - RTC in 3 to 6 months or sooner as needed  The indications, risks, and benefits of colonoscopy were explained to the patient in detail. Risks include but are not limited to bleeding, perforation, adverse reaction to medications, and cardiopulmonary compromise. Sequelae include but are not limited to the possibility of surgery, hospitalization, and mortality. The patient verbalized understanding and wished to proceed. All questions answered, referred to the scheduler and bowel prep ordered. Further recommendations pending results of the exam.    Lavena Bullion, DO, FACG  11/14/2017, 8:38 AM   McGowen, Adrian Blackwater, MD

## 2017-11-14 NOTE — Patient Instructions (Signed)
If you are age 40 or older, your body mass index should be between 23-30. Your Body mass index is 39.06 kg/m. If this is out of the aforementioned range listed, please consider follow up with your Primary Care Provider.  If you are age 51 or younger, your body mass index should be between 19-25. Your Body mass index is 39.06 kg/m. If this is out of the aformentioned range listed, please consider follow up with your Primary Care Provider.   You have been scheduled for a colonoscopy. Please follow written instructions given to you at your visit today.  Please pick up your prep supplies at the pharmacy within the next 1-3 days. If you use inhalers (even only as needed), please bring them with you on the day of your procedure. Your physician has requested that you go to www.startemmi.com and enter the access code given to you at your visit today. This web site gives a general overview about your procedure. However, you should still follow specific instructions given to you by our office regarding your preparation for the procedure.  We have sent the following medications to your pharmacy for you to pick up at your convenience: Clenpiq  Call our office in the next 2 months to schedule your 3 to 6 month follow-up visit. [phone]  It was a pleasure to see you today!  Gerrit Heck, D.O.  Fiber Chart  You should be consuming 25-30g of fiber per day and drinking 8 glasses of water to help your bowels move regularly.  In the chart below you can look up how much fiber you are getting in an average day.  If you are not getting enough fiber, you should add a fiber supplement to your diet.  Examples of this include Metamucil, FiberCon and Citrucel.  These can be purchased at your local grocery store or pharmacy.      [email or web address]

## 2017-11-16 ENCOUNTER — Encounter: Payer: Self-pay | Admitting: Family Medicine

## 2017-11-25 ENCOUNTER — Encounter: Payer: Self-pay | Admitting: Gastroenterology

## 2017-11-25 DIAGNOSIS — K635 Polyp of colon: Secondary | ICD-10-CM

## 2017-11-25 HISTORY — DX: Polyp of colon: K63.5

## 2017-12-05 ENCOUNTER — Ambulatory Visit: Payer: Managed Care, Other (non HMO) | Admitting: Family Medicine

## 2017-12-05 ENCOUNTER — Encounter: Payer: Self-pay | Admitting: Family Medicine

## 2017-12-05 VITALS — BP 130/79 | HR 81 | Temp 98.8°F | Resp 20 | Ht 66.0 in | Wt 240.0 lb

## 2017-12-05 DIAGNOSIS — E781 Pure hyperglyceridemia: Secondary | ICD-10-CM

## 2017-12-05 DIAGNOSIS — E1165 Type 2 diabetes mellitus with hyperglycemia: Secondary | ICD-10-CM

## 2017-12-05 DIAGNOSIS — F418 Other specified anxiety disorders: Secondary | ICD-10-CM

## 2017-12-05 DIAGNOSIS — Z79899 Other long term (current) drug therapy: Secondary | ICD-10-CM | POA: Diagnosis not present

## 2017-12-05 DIAGNOSIS — IMO0001 Reserved for inherently not codable concepts without codable children: Secondary | ICD-10-CM

## 2017-12-05 LAB — HEPATIC FUNCTION PANEL
ALBUMIN: 4.1 g/dL (ref 3.5–5.2)
ALK PHOS: 82 U/L (ref 39–117)
ALT: 37 U/L — ABNORMAL HIGH (ref 0–35)
AST: 30 U/L (ref 0–37)
Bilirubin, Direct: 0.2 mg/dL (ref 0.0–0.3)
Total Bilirubin: 1.3 mg/dL — ABNORMAL HIGH (ref 0.2–1.2)
Total Protein: 6.5 g/dL (ref 6.0–8.3)

## 2017-12-05 LAB — POCT GLYCOSYLATED HEMOGLOBIN (HGB A1C): Hemoglobin A1C: 10.8 % — AB (ref 4.0–5.6)

## 2017-12-05 LAB — LIPID PANEL
Cholesterol: 128 mg/dL (ref 0–200)
HDL: 25.1 mg/dL — ABNORMAL LOW (ref >39.00)
NONHDL: 102.81
TRIGLYCERIDES: 276 mg/dL — AB (ref 0.0–149.0)
Total CHOL/HDL Ratio: 5
VLDL: 55.2 mg/dL — ABNORMAL HIGH (ref 0.0–40.0)

## 2017-12-05 LAB — LDL CHOLESTEROL, DIRECT: Direct LDL: 70 mg/dL

## 2017-12-05 MED ORDER — GLIMEPIRIDE 4 MG PO TABS: 8.0000 mg | ORAL_TABLET | Freq: Every day | ORAL | 1 refills | Status: DC

## 2017-12-05 MED ORDER — LIRAGLUTIDE 18 MG/3ML ~~LOC~~ SOPN: PEN_INJECTOR | SUBCUTANEOUS | 3 refills | Status: DC

## 2017-12-05 MED ORDER — VENLAFAXINE HCL ER 150 MG PO CP24: 150.0000 mg | ORAL_CAPSULE | Freq: Every day | ORAL | 1 refills | Status: DC

## 2017-12-05 NOTE — Progress Notes (Signed)
Melissa Gray , March 15, 1975, 42 y.o., female MRN: [id number] Patient Care Team    Relationship Specialty Notifications Start End  Ma Hillock, DO PCP - General Family Medicine  06/03/15   Lavena Bullion, DO Consulting Physician Gastroenterology  11/16/17    Chief Complaint  Patient presents with  . Diabetes  . Depression  . Anxiety    Subjective:  Uncontrolled type 2 diabetes mellitus without complication, without long-term current use of insulin (HCC)/Morbid obesity ([place]) Patient reports she has been compliant with [place] daily and amaryl 4 mg QD.  She still not monitoring her blood glucose levels. Patient denies dizziness, hyperglycemic or hypoglycemic events. Patient denies numbness, tingling in the extremities or nonhealing wounds of feet.  She is not able to tolerate any type of metformin without severe GI side effects. - she attended diabetes nutrition with her husband.  - Eye exam 03/01/2017 - Foot exam completed 03/15/17 - Prevnar administered 6/7//2017; PNA23 completed 05/28/2016 - flu shot encouraged yearly completed 10/2017 - microalbumin collected 03/15/2017  Depression with anxiety Still doing great on Effexor 150 mg daily. Denies side effects.     Hyperlipidemia: tolerating statin. Tolerating.   Prior note 05/12/2016: Pt presents for an OV with complaints of worsening depression and anxiety of the last 6 months. She reports she has had depression and anxiety problems throughout her entire life. She has not talked about it or seen a provider for it in the past. She has never been on medications for this issue. She endorses becoming more emotional, breaking out in tears frequently, lack of motivation to do anything including bath, be social, do choirs., take her medicines and take care of her diabetes. She feels she has been snapping at her husband. She feels she is putting all the choirs/house responsibilities on him and it makes her feel guilty. She feels he has  been a great support. She has been unable to sleep well secondary to worrying and thinking instead of sleeping at bedtime. She thinks of "bad outcomes" of everyday subjects. She reports there is nothing in particular that causing her to feel this way, it is "just everything". She states she makes plans with her friends and then cancels a few hours prior. She knows she will have fun if she goes, but she is fearful she will have a panic attack. She reports having a few panic attacks recently, prior to that her last panic attack was about 5 years ago.   Mood disorder: negative today.  Depression screen [facility] 2/9 12/05/2017 08/31/2017 03/15/2017 02/16/2017 12/09/2016  Decreased Interest 0 0 0 3 0  Down, Depressed, Hopeless 1 0 1 3 0  PHQ - 2 Score 1 0 1 6 0  Altered sleeping 0 1 1 3  0  Tired, decreased energy 2 2 1 3  0  Change in appetite 0 0 1 3 0  Feeling bad or failure about yourself  0 0 0 3 0  Trouble concentrating 0 0 0 3 -  Moving slowly or fidgety/restless 0 0 0 - 0  Suicidal thoughts 0 0 0 0 0  PHQ-9 Score 3 3 4 21  0  Difficult doing work/chores Not difficult at all Not difficult at all - - -  Some recent data might be hidden   GAD 7 : Generalized Anxiety Score 12/05/2017 03/15/2017 02/16/2017 12/09/2016  Nervous, Anxious, on Edge 0 1 3 0  Control/stop worrying 0 1 3 0  Worry too much - different things 0 1 3 0  Trouble relaxing 1 1 3  0  Restless 0 0 3 0  Easily annoyed or irritable 0 1 3 0  Afraid - awful might happen 0 1 3 0  Total GAD 7 Score 1 6 21  0  Anxiety Difficulty Not difficult at all - Extremely difficult -     Allergies  Allergen Reactions  . Tape Other (See Comments)    Adhesive tape causes blisters  . Levothyroxine Rash    Generic brand only caused rash   Social History   Tobacco Use  . Smoking status: Never Smoker  . Smokeless tobacco: Never Used  Substance Use Topics  . Alcohol use: Yes    Alcohol/week: 0.0 standard drinks    Comment: ocassionally   Past  Medical History:  Diagnosis Date  . Anxiety   . Depression   . Diabetes mellitus without complication ([place])   . Elevated cholesterol   . Gallstones   . GERD (gastroesophageal reflux disease)   . Hematochezia 10/2017   Plan for colonoscopy  . IBS (irritable bowel syndrome)    diarrhea predominant  . Obesity   . Thyroid disease    Past Surgical History:  Procedure Laterality Date  . CHOLECYSTECTOMY  2006  . CYST EXCISION    . GLOMUS TUMOR EXCISION  2005, 2012   finger and cheek   Family History  Problem Relation Age of Onset  . Diabetes Mother   . COPD Father   . Heart disease Father   . Early death Father   . Diabetes Brother   . Alcohol abuse Maternal Aunt   . Alzheimer's disease Maternal Aunt   . Breast cancer Maternal Aunt   . Diabetes Maternal Grandmother   . Hearing loss Maternal Grandmother   . Early death Maternal Grandfather   . Cancer Maternal Grandfather   . Breast cancer Paternal Grandmother   . Heart disease Paternal Grandfather   . Breast cancer Paternal Aunt   . Heart disease Paternal Uncle   . Colon cancer Neg Hx   . Esophageal cancer Neg Hx    Allergies as of 12/05/2017      Reactions   Tape Other (See Comments)   Adhesive tape causes blisters   Levothyroxine Rash   Generic brand only caused rash      Medication List        Accurate as of 12/05/17 10:07 AM. Always use your most recent med list.          atorvastatin 40 MG tablet Commonly known as:  LIPITOR Take 1 tablet (40 mg total) by mouth daily.   fluticasone 50 MCG/ACT nasal spray Commonly known as:  FLONASE Place 1 spray into both nostrils as needed.   glimepiride 2 MG tablet Commonly known as:  AMARYL 4 mg before breakfas   sitaGLIPtin 100 MG tablet Commonly known as:  JANUVIA Take 1 tablet (100 mg total) by mouth daily.   Sod Picosulfate-Mag Ox-Cit Acd 10-3.5-12 MG-GM -GM/160ML Soln Take 1 kit by mouth as directed.   venlafaxine XR 150 MG 24 hr capsule Commonly  known as:  EFFEXOR-XR Take 1 capsule (150 mg total) by mouth daily with breakfast. Needs appt prior to anymore refills       Results for orders placed or performed in visit on 12/05/17 (from the past 24 hour(s))  POCT glycosylated hemoglobin (Hb A1C)     Status: Abnormal   Collection Time: 12/05/17 10:05 AM  Result Value Ref Range   Hemoglobin A1C 10.8 (A) 4.0 -  5.6 %   HbA1c POC (<> result, manual entry)     HbA1c, POC (prediabetic range)     HbA1c, POC (controlled diabetic range)     No results found.   ROS: Negative, with the exception of above mentioned in HPI   Objective:  BP 130/79 (BP Location: Left Arm, Patient Position: Sitting, Cuff Size: Large)   Pulse 81   Temp 98.8 F (37.1 C)   Resp 20   Ht 5' 6"  (1.676 m)   Wt 240 lb (108.9 kg)   LMP 12/01/2017   SpO2 97%   BMI 38.74 kg/m  Body mass index is 38.74 kg/m. Gen: Afebrile. No acute distress. Nontoxic in appearance, pleasant caucasian female. Appears well today. Obese.  HENT: AT. [place].  MMM.  Eyes:Pupils Equal Round Reactive to light, Extraocular movements intact,  Conjunctiva without redness, discharge or icterus. CV: RRR no murmur, no edema, +2/4 P posterior tibialis pulses Chest: CTAB, no wheeze or crackles Abd: Soft. NTND. BS present. no Masses palpated.  Skin: no rashes, purpura or petechiae.  Neuro:  Normal gait. PERLA. EOMi. Alert. Oriented.  Psych: Normal affect, dress and demeanor. Normal speech. Normal thought content and judgment.   Results for orders placed or performed in visit on 12/05/17 (from the past 24 hour(s))  POCT glycosylated hemoglobin (Hb A1C)     Status: Abnormal   Collection Time: 12/05/17 10:05 AM  Result Value Ref Range   Hemoglobin A1C 10.8 (A) 4.0 - 5.6 %   HbA1c POC (<> result, manual entry)     HbA1c, POC (prediabetic range)     HbA1c, POC (controlled diabetic range)     Diabetic Foot Exam - Simple   Simple Foot Form Diabetic Foot exam was performed with the following  findings:  Yes 12/05/2017 12:29 PM  Visual Inspection No deformities, no ulcerations, no other skin breakdown bilaterally:  Yes Sensation Testing Intact to touch and monofilament testing bilaterally:  Yes Pulse Check Posterior Tibialis and Dorsalis pulse intact bilaterally:  Yes Comments      Assessment/Plan: Gabbi Whetstone is a 42 y.o. female present for OV for  Uncontrolled type 2 diabetes mellitus without complication, without long-term current use of insulin (HCC) Morbid obesity ([place]) -  Reports compliance today, which is a step in the right direction for her, but still increasing a1c > 10. She is not watching her diet well. She is not exercising. A1c 9.6--> 7.8 --> 9.3--> 9.6--> 10.8. She does not want to start insulin. Discussed last options for her and she is agreeable to changes today in amaryl and starting victoza. If does not respond w/ a1c < 9 next visit will refer to endocrine.  - continue Januvia 100 mg daily.  - start Victoza 0.6 mg daily for 1 week, then increase to 1.2 mg QD -  increase Amaryl to 8 mg daily, may split dose if desired  - start monitoring fasting sugars - flu shot 10/2017 - Eye exam UTD 03/26/2015, [place]--> 03/01/2017 awaiting records. - Foot exam completed 12/05/2017 - Prevnar administered 6/7//2017; PNA23 completed 06/17/2016 - microalbumin collected 08/21/2015- normal --> 03/15/2017 - 3 months  Depression anxiety: -  Stable refills today -  Effexor 150 mg daily, doing rather well. Refills provided.   - Follow-up in 6 months  Hypertriglyceridemia/On statin therapy - tolerating statin.  - Lipid panel - Hepatic function panel   Reviewed expectations re: course of current medical issues.  Discussed self-management of symptoms.  Outlined signs and symptoms indicating need for more  acute intervention.  Patient verbalized understanding and all questions were answered.  Patient received an After-Visit Summary.   > 25 minutes spent with patient,  >50% of time spent face to face counseling   electronically signed by:  Howard Pouch, DO  [place]

## 2017-12-05 NOTE — Patient Instructions (Signed)
Amaryl increased to 8 mg a day (can be divided) Continue javuvia.  Start victoza daily by taper as instructed on the script.  F/u 3 months. If unable to get a1c < 9 we will refer to endocrine.  Decrease unhealthy carbohydrates in the diet (sugar, white flour, pasta etc)   Diabetes Mellitus and Nutrition When you have diabetes (diabetes mellitus), it is very important to have healthy eating habits because your blood sugar (glucose) levels are greatly affected by what you eat and drink. Eating healthy foods in the appropriate amounts, at about the same times every day, can help you:  Control your blood glucose.  Lower your risk of heart disease.  Improve your blood pressure.  Reach or maintain a healthy weight.  Every person with diabetes is different, and each person has different needs for a meal plan. Your health care provider may recommend that you work with a diet and nutrition specialist (dietitian) to make a meal plan that is best for you. Your meal plan may vary depending on factors such as:  The calories you need.  The medicines you take.  Your weight.  Your blood glucose, blood pressure, and cholesterol levels.  Your activity level.  Other health conditions you have, such as heart or kidney disease.  How do carbohydrates affect me? Carbohydrates affect your blood glucose level more than any other type of food. Eating carbohydrates naturally increases the amount of glucose in your blood. Carbohydrate counting is a method for keeping track of how many carbohydrates you eat. Counting carbohydrates is important to keep your blood glucose at a healthy level, especially if you use insulin or take certain oral diabetes medicines. It is important to know how many carbohydrates you can safely have in each meal. This is different for every person. Your dietitian can help you calculate how many carbohydrates you should have at each meal and for snack. Foods that contain carbohydrates  include:  Bread, cereal, rice, pasta, and crackers.  Potatoes and corn.  Peas, beans, and lentils.  Milk and yogurt.  Fruit and juice.  Desserts, such as cakes, cookies, ice cream, and candy.  How does alcohol affect me? Alcohol can cause a sudden decrease in blood glucose (hypoglycemia), especially if you use insulin or take certain oral diabetes medicines. Hypoglycemia can be a life-threatening condition. Symptoms of hypoglycemia (sleepiness, dizziness, and confusion) are similar to symptoms of having too much alcohol. If your health care provider says that alcohol is safe for you, follow these guidelines:  Limit alcohol intake to no more than 1 drink per day for nonpregnant women and 2 drinks per day for men. One drink equals 12 oz of beer, 5 oz of wine, or 1 oz of hard liquor.  Do not drink on an empty stomach.  Keep yourself hydrated with water, diet soda, or unsweetened iced tea.  Keep in mind that regular soda, juice, and other mixers may contain a lot of sugar and must be counted as carbohydrates.  What are tips for following this plan? Reading food labels  Start by checking the serving size on the label. The amount of calories, carbohydrates, fats, and other nutrients listed on the label are based on one serving of the food. Many foods contain more than one serving per package.  Check the total grams (g) of carbohydrates in one serving. You can calculate the number of servings of carbohydrates in one serving by dividing the total carbohydrates by 15. For example, if a food has 30  g of total carbohydrates, it would be equal to 2 servings of carbohydrates.  Check the number of grams (g) of saturated and trans fats in one serving. Choose foods that have low or no amount of these fats.  Check the number of milligrams (mg) of sodium in one serving. Most people should limit total sodium intake to less than 2,300 mg per day.  Always check the nutrition information of foods  labeled as "low-fat" or "nonfat". These foods may be higher in added sugar or refined carbohydrates and should be avoided.  Talk to your dietitian to identify your daily goals for nutrients listed on the label. Shopping  Avoid buying canned, premade, or processed foods. These foods tend to be high in fat, sodium, and added sugar.  Shop around the outside edge of the grocery store. This includes fresh fruits and vegetables, bulk grains, fresh meats, and fresh dairy. Cooking  Use low-heat cooking methods, such as baking, instead of high-heat cooking methods like deep frying.  Cook using healthy oils, such as olive, canola, or sunflower oil.  Avoid cooking with butter, cream, or high-fat meats. Meal planning  Eat meals and snacks regularly, preferably at the same times every day. Avoid going long periods of time without eating.  Eat foods high in fiber, such as fresh fruits, vegetables, beans, and whole grains. Talk to your dietitian about how many servings of carbohydrates you can eat at each meal.  Eat 4-6 ounces of lean protein each day, such as lean meat, chicken, fish, eggs, or tofu. 1 ounce is equal to 1 ounce of meat, chicken, or fish, 1 egg, or 1/4 cup of tofu.  Eat some foods each day that contain healthy fats, such as avocado, nuts, seeds, and fish. Lifestyle   Check your blood glucose regularly.  Exercise at least 30 minutes 5 or more days each week, or as told by your health care provider.  Take medicines as told by your health care provider.  Do not use any products that contain nicotine or tobacco, such as cigarettes and e-cigarettes. If you need help quitting, ask your health care provider.  Work with a Social worker or diabetes educator to identify strategies to manage stress and any emotional and social challenges. What are some questions to ask my health care provider?  Do I need to meet with a diabetes educator?  Do I need to meet with a dietitian?  What number  can I call if I have questions?  When are the best times to check my blood glucose? Where to find more information:  American Diabetes Association: diabetes.org/food-and-fitness/food  Academy of Nutrition and Dietetics: PokerClues.dk  [employer] of Diabetes and Digestive and Kidney Diseases (NIH): ContactWire.be Summary  A healthy meal plan will help you control your blood glucose and maintain a healthy lifestyle.  Working with a diet and nutrition specialist (dietitian) can help you make a meal plan that is best for you.  Keep in mind that carbohydrates and alcohol have immediate effects on your blood glucose levels. It is important to count carbohydrates and to use alcohol carefully. This information is not intended to replace advice given to you by your health care provider. Make sure you discuss any questions you have with your health care provider. Document Released: 10/08/2004 Document Revised: 02/16/2016 Document Reviewed: 02/16/2016 Elsevier Interactive Patient Education  [employer].

## 2017-12-06 ENCOUNTER — Telehealth: Payer: Self-pay | Admitting: Family Medicine

## 2017-12-06 NOTE — Telephone Encounter (Signed)
Spoke with patient reviewed lab results and instructions. Patient verbalized understanding. 

## 2017-12-06 NOTE — Telephone Encounter (Signed)
Please inform patient the following information: Her total cholesterol and  LDL (bad) cholesterol look excellent. Her HDL (good) cholesterol is still lower than desired and her triglycerides are still higher than desired. SO, half the battle is won with the start of Lipitor. In order to get the rest of it improved I would recommend dietary modifications, routine exercise - start with 3 days week and work up to 150 min. A week. Try to lower the saturated fat, trans fat, and cholesterol in your diet. Cutting back on carbohydrates will help, too (which also helps with diabetes). Start a omega-3 fish oil supplement 2000 mg a day (or night). These options target the good cholesterol and triglycerides.

## 2017-12-09 ENCOUNTER — Encounter: Payer: Self-pay | Admitting: Gastroenterology

## 2017-12-09 ENCOUNTER — Ambulatory Visit (AMBULATORY_SURGERY_CENTER): Payer: Managed Care, Other (non HMO) | Admitting: Gastroenterology

## 2017-12-09 ENCOUNTER — Other Ambulatory Visit: Payer: Self-pay

## 2017-12-09 VITALS — BP 122/75 | HR 71 | Temp 95.5°F | Resp 12 | Ht 66.0 in | Wt 242.0 lb

## 2017-12-09 DIAGNOSIS — K635 Polyp of colon: Secondary | ICD-10-CM | POA: Diagnosis not present

## 2017-12-09 DIAGNOSIS — K621 Rectal polyp: Secondary | ICD-10-CM

## 2017-12-09 DIAGNOSIS — K640 First degree hemorrhoids: Secondary | ICD-10-CM | POA: Diagnosis not present

## 2017-12-09 DIAGNOSIS — D123 Benign neoplasm of transverse colon: Secondary | ICD-10-CM

## 2017-12-09 DIAGNOSIS — R197 Diarrhea, unspecified: Secondary | ICD-10-CM

## 2017-12-09 DIAGNOSIS — D128 Benign neoplasm of rectum: Secondary | ICD-10-CM

## 2017-12-09 DIAGNOSIS — K921 Melena: Secondary | ICD-10-CM

## 2017-12-09 MED ORDER — SODIUM CHLORIDE 0.9 % IV SOLN: 500.0000 mL | Freq: Once | INTRAVENOUS | Status: DC

## 2017-12-09 NOTE — Patient Instructions (Signed)
Handouts:  Hemorrhoids and Polyps  YOU HAD AN ENDOSCOPIC PROCEDURE TODAY AT [place]:   Refer to the procedure report that was given to you for any specific questions about what was found during the examination.  If the procedure report does not answer your questions, please call your gastroenterologist to clarify.  If you requested that your care partner not be given the details of your procedure findings, then the procedure report has been included in a sealed envelope for you to review at your convenience later.  YOU SHOULD EXPECT: Some feelings of bloating in the abdomen. Passage of more gas than usual.  Walking can help get rid of the air that was put into your GI tract during the procedure and reduce the bloating. If you had a lower endoscopy (such as a colonoscopy or flexible sigmoidoscopy) you may notice spotting of blood in your stool or on the toilet paper. If you underwent a bowel prep for your procedure, you may not have a normal bowel movement for a few days.  Please Note:  You might notice some irritation and congestion in your nose or some drainage.  This is from the oxygen used during your procedure.  There is no need for concern and it should clear up in a day or so.  SYMPTOMS TO REPORT IMMEDIATELY:   Following lower endoscopy (colonoscopy or flexible sigmoidoscopy):  Excessive amounts of blood in the stool  Significant tenderness or worsening of abdominal pains  Swelling of the abdomen that is new, acute  Fever of 100F or higher   Following upper endoscopy (EGD)  Vomiting of blood or coffee ground material  New chest pain or pain under the shoulder blades  Painful or persistently difficult swallowing  New shortness of breath  Fever of 100F or higher  Black, tarry-looking stools  For urgent or emergent issues, a gastroenterologist can be reached at any hour by calling [phone].   DIET:  We do recommend a small meal at first, but then you may  proceed to your regular diet.  Drink plenty of fluids but you should avoid alcoholic beverages for 24 hours.  ACTIVITY:  You should plan to take it easy for the rest of today and you should NOT DRIVE or use heavy machinery until tomorrow (because of the sedation medicines used during the test).    FOLLOW UP: Our staff will call the number listed on your records the next business day following your procedure to check on you and address any questions or concerns that you may have regarding the information given to you following your procedure. If we do not reach you, we will leave a message.  However, if you are feeling well and you are not experiencing any problems, there is no need to return our call.  We will assume that you have returned to your regular daily activities without incident.  If any biopsies were taken you will be contacted by phone or by letter within the next 1-3 weeks.  Please call us at [phone] if you have not heard about the biopsies in 3 weeks.    SIGNATURES/CONFIDENTIALITY: You and/or your care partner have signed paperwork which will be entered into your electronic medical record.  These signatures attest to the fact that that the information above on your After Visit Summary has been reviewed and is understood.  Full responsibility of the confidentiality of this discharge information lies with you and/or your care-partner.

## 2017-12-09 NOTE — Progress Notes (Signed)
History reviewed today 

## 2017-12-09 NOTE — Progress Notes (Signed)
To PACU, VSS. Report to [email or web address] 

## 2017-12-09 NOTE — Op Note (Signed)
[place] Patient Name: Melissa Gray Procedure Date: 12/09/2017 2:35 PM MRN: [id number] Endoscopist: Gerrit Heck , MD Age: 42 Referring MD:  Date of Birth: 01/17/1976 Gender: Female Account #: [id number] Procedure:                Colonoscopy Indications:              Hematochezia, Chronic diarrhea Medicines:                Monitored Anesthesia Care Procedure:                Pre-Anesthesia Assessment:                           - Prior to the procedure, a History and Physical                            was performed, and patient medications and                            allergies were reviewed. The patient's tolerance of                            previous anesthesia was also reviewed. The risks                            and benefits of the procedure and the sedation                            options and risks were discussed with the patient.                            All questions were answered, and informed consent                            was obtained. Prior Anticoagulants: The patient has                            taken no previous anticoagulant or antiplatelet                            agents. ASA Grade Assessment: II - A patient with                            mild systemic disease. After reviewing the risks                            and benefits, the patient was deemed in                            satisfactory condition to undergo the procedure.                           After obtaining informed consent, the colonoscope  was passed under direct vision. Throughout the                            procedure, the patient's blood pressure, pulse, and                            oxygen saturations were monitored continuously. The                            Model PCF-H190DL [id number]) scope was introduced                            through the anus and advanced to the the terminal                            ileum. The colonoscopy  was performed without                            difficulty. The patient tolerated the procedure                            well. The quality of the bowel preparation was fair. Scope In: 0:81:44 PM Scope Out: 3:03:33 PM Scope Withdrawal Time: 0 hours 15 minutes 40 seconds  Total Procedure Duration: 0 hours 17 minutes 22 seconds  Findings:                 Hemorrhoids were found on perianal exam.                           A 2 mm polyp was found in the transverse colon. The                            polyp was sessile. The polyp was removed with a                            cold biopsy forceps. Resection and retrieval were                            complete. Estimated blood loss was minimal.                           Two sessile polyps were found in the rectum. The                            polyps were 2 to 3 mm in size. These polyps were                            removed with a cold biopsy forceps. Resection and                            retrieval were complete. Estimated blood loss was  minimal.                           A moderate amount of semi-liquid stool was found in                            the entire colon, interfering with visualization.                            Lavage of the area was performed using copious                            amounts of sterile water, resulting in clearance                            with fair visualization.                           The visualized mucosa was otherwise normal                            appearing throughout the colon. There were no areas                            of mucosal erythema, edema, erosions, or ulceration                            noted. Biopsies for histology were taken with a                            cold forceps from the right colon and left colon                            for evaluation of microscopic colitis. Estimated                            blood loss was minimal.                            Non-bleeding internal hemorrhoids were found during                            retroflexion. The hemorrhoids were small.                           The terminal ileum appeared normal. Complications:            No immediate complications. Estimated Blood Loss:     Estimated blood loss was minimal. Impression:               - Preparation of the colon was fair.                           - Hemorrhoids found on perianal exam.                           -  One 2 mm polyp in the transverse colon, removed                            with a cold biopsy forceps. Resected and retrieved.                           - Two 2 to 3 mm polyps in the rectum, removed with                            a cold biopsy forceps. Resected and retrieved.                           - Stool in the entire examined colon.                           - The visualized mucosa was otherwise normal                            appearing throughout the colon. Biopsied.                           - Non-bleeding internal hemorrhoids.                           - The examined portion of the ileum was normal. Recommendation:           - Patient has a contact number available for                            emergencies. The signs and symptoms of potential                            delayed complications were discussed with the                            patient. Return to normal activities tomorrow.                            Written discharge instructions were provided to the                            patient.                           - Resume previous diet today.                           - Continue present medications.                           - Await pathology results.                           - Repeat colonoscopy in 5-10 years for surveillance  based on pathology results.                           - Return to GI clinic PRN.                           - Use fiber, for example Citrucel, Fibercon, Konsyl                             or Metamucil. Gerrit Heck, MD 12/09/2017 3:10:34 PM

## 2017-12-09 NOTE — Progress Notes (Signed)
Called to room to assist during endoscopic procedure.  Patient ID and intended procedure confirmed with present staff. Received instructions for my participation in the procedure from the performing physician.  

## 2017-12-12 ENCOUNTER — Telehealth: Payer: Self-pay

## 2017-12-12 ENCOUNTER — Other Ambulatory Visit: Payer: Self-pay | Admitting: *Deleted

## 2017-12-12 MED ORDER — PEN NEEDLES 31G X 5 MM MISC: 1.0000 | Freq: Every day | 3 refills | Status: DC

## 2017-12-12 NOTE — Telephone Encounter (Signed)
  Follow up Call-  Call back number 12/09/2017  Post procedure Call Back phone  # [phone]  Permission to leave phone message Yes  Some recent data might be hidden     Patient questions:  Do you have a fever, pain , or abdominal swelling? No. Pain Score  0 *  Have you tolerated food without any problems? Yes.    Have you been able to return to your normal activities? Yes.    Do you have any questions about your discharge instructions: Diet   No. Medications  No. Follow up visit  No.  Do you have questions or concerns about your Care? No.  Actions: * If pain score is 4 or above: No action needed, pain <4.

## 2017-12-20 ENCOUNTER — Encounter: Payer: Self-pay | Admitting: Gastroenterology

## 2017-12-20 ENCOUNTER — Encounter: Payer: Self-pay | Admitting: Family Medicine

## 2018-02-06 ENCOUNTER — Ambulatory Visit: Payer: Self-pay | Admitting: *Deleted

## 2018-02-06 NOTE — Telephone Encounter (Signed)
Pt called with complaints of rectal bleeding that is like it was before; she had what felt like diarrhea, but it was all blood; she was seen by gastro 12/09/2017; she says that she was on her period both times; the pt says that she read  bowel endometriosis can be confused with IBS; she reports also having a cycle of constipation and then diarrhea; recommendations made per nurse triage protocol; pt offered and accepted appointment with Dr Raoul Pitch, [place], 02/07/2018 at 0915; she verbalized understanding; will route to office for notification.       Reason for Disposition . MODERATE rectal bleeding (small blood clots, passing blood without stool, or toilet water turns red)  Answer Assessment - Initial Assessment Questions 1. APPEARANCE of BLOOD: "What color is it?" "Is it passed separately, on the surface of the stool, or mixed in with the stool?"      brighter red than last time this happened 2. AMOUNT: "How much blood was passed?"      Enough to turn the toilet water red  3. FREQUENCY: "How many times has blood been passed with the stools?"      once 4. ONSET: "When was the blood first seen in the stools?" (Days or weeks)      02/04/2018 5. DIARRHEA: "Is there also some diarrhea?" If so, ask: "How many diarrhea stools were passed in past 24 hours?"      yes 6. CONSTIPATION: "Do you have constipation?" If so, "How bad is it?"     yes 7. RECURRENT SYMPTOMS: "Have you had blood in your stools before?" If so, ask: "When was the last time?" and "What happened that time?"      Yes seen by GI 12/09/2017 8. BLOOD THINNERS: "Do you take any blood thinners?" (e.g., Coumadin/warfarin, Pradaxa/dabigatran, aspirin)     no 9. OTHER SYMPTOMS: "Do you have any other symptoms?"  (e.g., abdominal pain, vomiting, dizziness, fever)     Cramps due to periond 10. PREGNANCY: "Is there any chance you are pregnant?" "When was your last menstrual period?"       LMP 02/04/2018  Protocols used: RECTAL  BLEEDING-A-AH

## 2018-02-07 ENCOUNTER — Encounter: Payer: Self-pay | Admitting: Obstetrics and Gynecology

## 2018-02-07 ENCOUNTER — Encounter: Payer: Self-pay | Admitting: Family Medicine

## 2018-02-07 ENCOUNTER — Ambulatory Visit: Payer: Managed Care, Other (non HMO) | Admitting: Family Medicine

## 2018-02-07 VITALS — BP 107/76 | HR 88 | Temp 98.3°F | Resp 16 | Ht 66.0 in | Wt 241.0 lb

## 2018-02-07 DIAGNOSIS — N946 Dysmenorrhea, unspecified: Secondary | ICD-10-CM | POA: Diagnosis not present

## 2018-02-07 DIAGNOSIS — N926 Irregular menstruation, unspecified: Secondary | ICD-10-CM

## 2018-02-07 DIAGNOSIS — R194 Change in bowel habit: Secondary | ICD-10-CM | POA: Diagnosis not present

## 2018-02-07 MED ORDER — NAPROXEN 500 MG PO TABS: 500.0000 mg | ORAL_TABLET | Freq: Two times a day (BID) | ORAL | 1 refills | Status: DC

## 2018-02-07 NOTE — Patient Instructions (Signed)
Start naproxen every 12 hours with food the day you start your period- or if you are able to track period and know day you are to start --> you can take the day prior. This will help with pain and bleeding.    I have referred you to a gynecologist to further discuss your cycles and bowel changes  Associated with your cycles.     Endometriosis  Endometriosis is a condition in which the tissue that lines the uterus (endometrium) grows outside of its normal location. The tissue may grow in many locations close to the uterus, but it commonly grows on the ovaries, fallopian tubes, vagina, or bowel. When the uterus sheds the endometrium every menstrual cycle, there is bleeding wherever the endometrial tissue is located. This can cause pain because blood is irritating to tissues that are not normally exposed to it. What are the causes? The cause of endometriosis is not known. What increases the risk? You may be more likely to develop endometriosis if you:  Have a family history of endometriosis.  Have never given birth.  Started your period at age 41 or younger.  Have high levels of estrogen in your body.  Were exposed to a certain medicine (diethylstilbestrol) before you were born (in utero).  Had low birth weight.  Were born as a twin, triplet, or other multiple.  Have a BMI of less than 25. BMI is an estimate of body fat and is calculated from height and weight. What are the signs or symptoms? Often, there are no symptoms of this condition. If you do have symptoms, they may:  Vary depending on where your endometrial tissue is growing.  Occur during your menstrual period (most common) or midcycle.  Come and go, or you may go months with no symptoms at all.  Stop with menopause. Symptoms may include:  Pain in the back or abdomen.  Heavier bleeding during periods.  Pain during sex.  Painful bowel movements.  Infertility.  Pelvic pain.  Bleeding more than once a  month. How is this diagnosed? This condition is diagnosed based on your symptoms and a physical exam. You may have tests, such as:  Blood tests and urine tests. These may be done to help rule out other possible causes of your symptoms.  Ultrasound, to look for abnormal tissues.  An X-ray of the lower bowel (barium enema).  An ultrasound that is done through the vagina (transvaginally).  CT scan.  MRI.  Laparoscopy. In this procedure, a lighted, pencil-sized instrument called a laparoscope is inserted into your abdomen through an incision. The laparoscope allows your health care provider to look at the organs inside your body and check for abnormal tissue to confirm the diagnosis. If abnormal tissue is found, your health care provider may remove a small piece of tissue (biopsy) to be examined under a microscope. How is this treated? Treatment for this condition may include:  Medicines to relieve pain, such as NSAIDs.  Hormone therapy. This involves using artificial (synthetic) hormones to reduce endometrial tissue growth. Your health care provider may recommend using a hormonal form of birth control, or other medicines.  Surgery. This may be done to remove abnormal endometrial tissue. ? In some cases, tissue may be removed using a laparoscope and a laser (laparoscopic laser treatment). ? In severe cases, surgery may be done to remove the fallopian tubes, uterus, and ovaries (hysterectomy). Follow these instructions at home:  Take over-the-counter and prescription medicines only as told by your health care  provider.  Do not drive or use heavy machinery while taking prescription pain medicine.  Try to avoid activities that cause pain, including sexual activity.  Keep all follow-up visits as told by your health care provider. This is important. Contact a health care provider if:  You have pain in the area between your hip bones (pelvic area) that occurs: ? Before, during, or after  your period. ? In between your period and gets worse during your period. ? During or after sex. ? With bowel movements or urination, especially during your period.  You have problems getting pregnant.  You have a fever. Get help right away if:  You have severe pain that does not get better with medicine.  You have severe nausea and vomiting, or you cannot eat without vomiting.  You have pain that affects only the lower, right side of your abdomen.  You have abdominal pain that gets worse.  You have abdominal swelling.  You have blood in your stool. This information is not intended to replace advice given to you by your health care provider. Make sure you discuss any questions you have with your health care provider. Document Released: 01/09/2000 Document Revised: 10/17/2015 Document Reviewed: 06/14/2015 Elsevier Interactive Patient Education  2019 [employer].

## 2018-02-07 NOTE — Progress Notes (Signed)
Melissa Gray , 1976-01-23, 43 y.o., female MRN: [id number] Patient Care Team    Relationship Specialty Notifications Start End  Ma Hillock, DO PCP - General Family Medicine  06/03/15   Lavena Bullion, DO Consulting Physician Gastroenterology  11/16/17     Chief Complaint  Patient presents with  . Diarrhea    had on saturday and was mostly blood, prior occurance in Oct 2019. Pd also started on Saturday. Pt is also having abdominal cramps, unsure if they are related to period or constipation cramps.       Subjective: Pt presents for an OV with complaints of recurrent rectal bleeding that occurred Saturday x1.  She had a similar episode in [place] with that occurred at the time of her menses. She was seen by gastro and had a colonoscopy. She was shown to have colon polyps and hemorrhoids. She has not started the fiber supplement recommended by GI. She did not have rectal bleeding with her December menses.Patient's last menstrual period was 02/04/2018. She reports her cycles are irregular- occur between 3-5 weeks apart. They have become more painful with cramping- causing her to leave/miss work. She states she is tearful today because of the pain- not emotional liability with menses. She has had difficulty with conceiving, G0P0.  She denies recurrent melena after X1 Saturday . She denies fever, chills, nausea or vomit. She denies fatigue (more than her usual) or dizziness.   Depression screen [facility] 2/9 12/05/2017 08/31/2017 03/15/2017 02/16/2017 12/09/2016  Decreased Interest 0 0 0 3 0  Down, Depressed, Hopeless 1 0 1 3 0  PHQ - 2 Score 1 0 1 6 0  Altered sleeping 0 1 1 3  0  Tired, decreased energy 2 2 1 3  0  Change in appetite 0 0 1 3 0  Feeling bad or failure about yourself  0 0 0 3 0  Trouble concentrating 0 0 0 3 -  Moving slowly or fidgety/restless 0 0 0 - 0  Suicidal thoughts 0 0 0 0 0  PHQ-9 Score 3 3 4 21  0  Difficult doing work/chores Not difficult at all Not difficult  at all - - -  Some recent data might be hidden    Allergies  Allergen Reactions  . Tape Other (See Comments)    Adhesive tape causes blisters  . Levothyroxine Rash    Generic brand only caused rash   Social History   Tobacco Use  . Smoking status: Never Smoker  . Smokeless tobacco: Never Used  Substance Use Topics  . Alcohol use: Yes    Alcohol/week: 0.0 standard drinks    Comment: ocassionally   Past Medical History:  Diagnosis Date  . Anxiety   . Depression   . Diabetes mellitus without complication ([place])   . Elevated cholesterol   . Gallstones   . GERD (gastroesophageal reflux disease)   . Hematochezia 10/2017   Plan for colonoscopy  . Hyperplastic colon polyp 11/2017  . IBS (irritable bowel syndrome)    diarrhea predominant  . Obesity   . Thyroid disease 2004   treated for years, stopped 2014 per new MD   Past Surgical History:  Procedure Laterality Date  . CHOLECYSTECTOMY  2006  . CYST EXCISION    . GLOMUS TUMOR EXCISION  2005, 2012   finger and cheek   Family History  Problem Relation Age of Onset  . Diabetes Mother   . COPD Father   . Heart disease Father   . Early  death Father   . Diabetes Brother   . Alcohol abuse Maternal Aunt   . Alzheimer's disease Maternal Aunt   . Breast cancer Maternal Aunt   . Diabetes Maternal Grandmother   . Hearing loss Maternal Grandmother   . Early death Maternal Grandfather   . Cancer Maternal Grandfather   . Breast cancer Paternal Grandmother   . Heart disease Paternal Grandfather   . Breast cancer Paternal Aunt   . Heart disease Paternal Uncle   . Colon cancer Neg Hx   . Esophageal cancer Neg Hx   . Rectal cancer Neg Hx   . Stomach cancer Neg Hx    Allergies as of 02/07/2018      Reactions   Tape Other (See Comments)   Adhesive tape causes blisters   Levothyroxine Rash   Generic brand only caused rash      Medication List       Accurate as of February 07, 2018  9:18 AM. Always use your most recent  med list.        acetaminophen 325 MG tablet Commonly known as:  TYLENOL Take 325 mg by mouth every 6 (six) hours as needed. 2 tab once daily   atorvastatin 40 MG tablet Commonly known as:  LIPITOR Take 1 tablet (40 mg total) by mouth daily.   glimepiride 4 MG tablet Commonly known as:  AMARYL Take 2 tablets (8 mg total) by mouth daily with breakfast.   liraglutide 18 MG/3ML Sopn Commonly known as:  VICTOZA 0.6 mg SQ injection daily for 7 days, then increase to 1.2 mg sq inj daily.   Pen Needles 31G X 5 MM Misc 1 Device by Does not apply route daily.   sitaGLIPtin 100 MG tablet Commonly known as:  JANUVIA Take 1 tablet (100 mg total) by mouth daily.   venlafaxine XR 150 MG 24 hr capsule Commonly known as:  EFFEXOR-XR Take 1 capsule (150 mg total) by mouth daily with breakfast. Needs appt prior to anymore refills       All past medical history, surgical history, allergies, family history, immunizations andmedications were updated in the EMR today and reviewed under the history and medication portions of their EMR.     ROS: Negative, with the exception of above mentioned in HPI   Objective:  BP 107/76 (BP Location: Left Arm, Patient Position: Sitting, Cuff Size: Large)   Pulse 88   Temp 98.3 F (36.8 C) (Oral)   Resp 16   Ht 5\' 6"  (1.676 m)   Wt 241 lb (109.3 kg)   LMP 02/04/2018   SpO2 96%   BMI 38.90 kg/m  Body mass index is 38.9 kg/m. Gen: Afebrile. No acute distress. Nontoxic in appearance, well developed, well nourished.  Eyes:Pupils Equal Round Reactive to light, Extraocular movements intact,  Conjunctiva without redness, discharge or icterus. CV: RRR  Abd: Soft. ND. Mildly TTP lower pelvic BS present. No Masses palpated.  Neuro: Normal gait. PERLA. EOMi. Alert. Oriented x3 . Psych: tearful today, otherwise Normal affect, dress and demeanor. Normal speech. Normal thought content and judgment.  No exam data present No results found. No results found  for this or any previous visit (from the past 24 hour(s)).  Assessment/Plan: Ivi Griffith is a 43 y.o. female present for OV for  Irregular menses/Menses painful/Bowel habit changes - discussed options today to help with menses discomfort. GI issues may or may not be related to Menses. She has a h/o infertility- endometriosis is certainly a possibility of  her current issues.  - Asymptomatic from x1 bleeding episode and no recurrence.  - Start naproxen  Day of menses through menses,. Prescribed.  - discussed fluoxetine- PMDD- pt did not want to start this medication  - Ambulatory referral to Gynecology - F/u with GYN   Reviewed expectations re: course of current medical issues.  Discussed self-management of symptoms.  Outlined signs and symptoms indicating need for more acute intervention.  Patient verbalized understanding and all questions were answered.  Patient received an After-Visit Summary.    No orders of the defined types were placed in this encounter.   > 25 minutes spent with patient, >50% of time spent face to face   Note is dictated utilizing voice recognition software. Although note has been proof read prior to signing, occasional typographical errors still can be missed. If any questions arise, please do not hesitate to call for verification.   electronically signed by:  Howard Pouch, DO  [place]

## 2018-02-21 ENCOUNTER — Other Ambulatory Visit: Payer: Self-pay | Admitting: Obstetrics and Gynecology

## 2018-02-21 ENCOUNTER — Other Ambulatory Visit: Payer: Self-pay

## 2018-02-21 ENCOUNTER — Ambulatory Visit: Payer: Managed Care, Other (non HMO) | Admitting: Obstetrics and Gynecology

## 2018-02-21 ENCOUNTER — Encounter: Payer: Self-pay | Admitting: Obstetrics and Gynecology

## 2018-02-21 ENCOUNTER — Other Ambulatory Visit (HOSPITAL_COMMUNITY)
Admission: RE | Admit: 2018-02-21 | Discharge: 2018-02-21 | Disposition: A | Discharge status: Home / Self Care | Payer: Managed Care, Other (non HMO) | Source: Ambulatory Visit | Attending: Obstetrics and Gynecology | Admitting: Obstetrics and Gynecology

## 2018-02-21 VITALS — BP 136/70 | HR 80 | Resp 16 | Ht 65.5 in | Wt 244.0 lb

## 2018-02-21 DIAGNOSIS — Z124 Encounter for screening for malignant neoplasm of cervix: Secondary | ICD-10-CM | POA: Diagnosis not present

## 2018-02-21 DIAGNOSIS — N946 Dysmenorrhea, unspecified: Secondary | ICD-10-CM

## 2018-02-21 DIAGNOSIS — Z8481 Family history of carrier of genetic disease: Secondary | ICD-10-CM | POA: Diagnosis not present

## 2018-02-21 DIAGNOSIS — K625 Hemorrhage of anus and rectum: Secondary | ICD-10-CM

## 2018-02-21 DIAGNOSIS — N920 Excessive and frequent menstruation with regular cycle: Secondary | ICD-10-CM | POA: Diagnosis not present

## 2018-02-21 DIAGNOSIS — N6320 Unspecified lump in the left breast, unspecified quadrant: Secondary | ICD-10-CM

## 2018-02-21 NOTE — Progress Notes (Signed)
GYNECOLOGY  VISIT   HPI: 43 y.o.   Married  Caucasian  female   [id number] with Patient's last menstrual period was 02/04/2018.   Here as a new patient referred from Dr. Raoul Pitch for irregular and painful menses. Per patient, has had a colonoscopy 12/09/17 due to seeing a large amount of blood in her stool during her period. Colonoscopy was positive for hyperplastic polyps, however, she has since then seen blood in her stool with her last menstrual period. Patient also complains of having severe back pain during periods.      For the last year and a half she is missing work due to pain and heavy bleeding.  Back pain.  Using tampons and a pad.  Can need to change every 1 - 2 hours. Tylenol not helpful.  Normal TSH 08/31/17.   A1C 10.8 on 12/05/17. Started Victoza last year.  Expecting another A1C soon.  Feels her symptoms are much improved in this area.   Hgb 14.1.   Hx of infertility care in past.  Declines future childbearing.   Not having sexual intercourse.   Works for a [employer].   GYNECOLOGIC HISTORY: Patient's last menstrual period was 02/04/2018. Contraception:  None.  Infertility.  Menopausal hormone therapy:  n/a Last mammogram:  05/12/16 Left Breast Diagnostic MM/US -- BIRADS 3: Probably Benign/density b.  Was due for dx bilateral mammogram in April 2019.  Last pap smear:   07/02/15 Neg:Neg HR HPV        OB History    Gravida  0   Para  0   Term  0   Preterm  0   AB  0   Living  0     SAB  0   TAB  0   Ectopic  0   Multiple  0   Live Births                 Patient Active Problem List   Diagnosis Date Noted  . Depression with anxiety 05/28/2016  . Uncontrolled type 2 diabetes mellitus without complication, without long-term current use of insulin ([place]) 01/29/2016  . Hypertriglyceridemia 07/02/2015  . Low HDL (under 40) 07/02/2015  . FH: BRCA gene positive 07/02/2015  . Morbid obesity ([place]) 06/03/2015    Past Medical History:   Diagnosis Date  . Anxiety   . Depression   . Diabetes mellitus without complication ([place])   . Elevated cholesterol   . Gallstones   . GERD (gastroesophageal reflux disease)   . Hematochezia 10/2017   Plan for colonoscopy  . Hyperplastic colon polyp 11/2017  . IBS (irritable bowel syndrome)    diarrhea predominant  . Infertility, female   . Obesity   . Thyroid disease 2004   treated for years, stopped 2014 per new MD    Past Surgical History:  Procedure Laterality Date  . CHOLECYSTECTOMY  2006  . CYST EXCISION    . GLOMUS TUMOR EXCISION  2005, 2012   finger and cheek    Current Outpatient Medications  Medication Sig Dispense Refill  . acetaminophen (TYLENOL) 325 MG tablet Take 325 mg by mouth every 6 (six) hours as needed. 2 tab once daily    . atorvastatin (LIPITOR) 40 MG tablet Take 1 tablet (40 mg total) by mouth daily. 90 tablet 3  . glimepiride (AMARYL) 4 MG tablet Take 2 tablets (8 mg total) by mouth daily with breakfast. 180 tablet 1  . Insulin Pen Needle (PEN NEEDLES) 31G X 5 MM  MISC 1 Device by Does not apply route daily. 100 each 3  . liraglutide (VICTOZA) 18 MG/3ML SOPN 0.6 mg SQ injection daily for 7 days, then increase to 1.2 mg sq inj daily. 6 pen 3  . naproxen (NAPROSYN) 500 MG tablet Take 1 tablet (500 mg total) by mouth 2 (two) times daily with a meal. 30 tablet 1  . sitaGLIPtin (JANUVIA) 100 MG tablet Take 1 tablet (100 mg total) by mouth daily. 90 tablet 1  . venlafaxine XR (EFFEXOR-XR) 150 MG 24 hr capsule Take 1 capsule (150 mg total) by mouth daily with breakfast. Needs appt prior to anymore refills 90 capsule 1   No current facility-administered medications for this visit.      ALLERGIES: Tape and Levothyroxine  Family History  Problem Relation Age of Onset  . Diabetes Mother   . COPD Father   . Heart disease Father   . Early death Father   . Diabetes Brother   . Diabetes Maternal Grandmother   . Hearing loss Maternal Grandmother   . Dementia  Maternal Grandmother   . Early death Maternal Grandfather   . Cancer Maternal Grandfather   . Breast cancer Paternal Grandmother   . Heart disease Paternal Grandfather   . Breast cancer Paternal Aunt   . Heart disease Paternal Uncle   . Heart attack Paternal Uncle   . Breast cancer Paternal Aunt   . Alcohol abuse Paternal Aunt   . Alzheimer's disease Paternal Aunt   . Colon cancer Neg Hx   . Esophageal cancer Neg Hx   . Rectal cancer Neg Hx   . Stomach cancer Neg Hx     Social History   Socioeconomic History  . Marital status: Married    Spouse name: Not on file  . Number of children: 0  . Years of education: Not on file  . Highest education level: Not on file  Occupational History  . Occupation: Presenter, broadcasting  Social Needs  . Financial resource strain: Not on file  . Food insecurity:    Worry: Not on file    Inability: Not on file  . Transportation needs:    Medical: Not on file    Non-medical: Not on file  Tobacco Use  . Smoking status: Never Smoker  . Smokeless tobacco: Never Used  Substance and Sexual Activity  . Alcohol use: Yes    Alcohol/week: 0.0 standard drinks    Comment: ocassionally  . Drug use: No  . Sexual activity: Yes    Partners: Male    Birth control/protection: None  Lifestyle  . Physical activity:    Days per week: Not on file    Minutes per session: Not on file  . Stress: Not on file  Relationships  . Social connections:    Talks on phone: Not on file    Gets together: Not on file    Attends religious service: Not on file    Active member of club or organization: Not on file    Attends meetings of clubs or organizations: Not on file    Relationship status: Not on file  . Intimate partner violence:    Fear of current or ex partner: Not on file    Emotionally abused: Not on file    Physically abused: Not on file    Forced sexual activity: Not on file  Other Topics Concern  . Not on file  Social History Narrative    Married. Husband's name is Audelia Acton. No children.  2 pets.   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   Drinks caffeine   Wears her seatbelt   Smoke detector in home   Safe in her relationship.    Review of Systems  Constitutional: Negative.   HENT: Negative.   Eyes: Negative.   Respiratory: Negative.   Cardiovascular: Negative.   Gastrointestinal: Negative.   Endocrine: Negative.   Genitourinary: Positive for vaginal bleeding.       Painful periods  Musculoskeletal: Negative.   Skin: Negative.   Allergic/Immunologic: Negative.   Neurological: Negative.   Hematological: Negative.   Psychiatric/Behavioral: Negative.     PHYSICAL EXAMINATION:    BP 136/70 (BP Location: Right Arm, Patient Position: Sitting, Cuff Size: Large)   Pulse 80   Resp 16   Ht 5' 5.5" (1.664 m)   Wt 244 lb (110.7 kg)   LMP 02/04/2018   BMI 39.99 kg/m     General appearance: alert, cooperative and appears stated age Head: Normocephalic, without obvious abnormality, atraumatic Neck: no adenopathy, supple, symmetrical, trachea midline and thyroid normal to inspection and palpation Lungs: clear to auscultation bilaterally Breasts: normal appearance, no masses or tenderness, No nipple retraction or dimpling, No nipple discharge or bleeding, No axillary or supraclavicular adenopathy Heart: regular rate and rhythm Abdomen: Central obesity.  Abdomen is soft, non-tender, no masses,  no organomegaly Extremities: extremities normal, atraumatic, no cyanosis or edema Skin: Skin color, texture, turgor normal. No rashes or lesions Lymph nodes: Cervical, supraclavicular, and axillary nodes normal. No abnormal inguinal nodes palpated Neurologic: Grossly normal  Pelvic: External genitalia:  no lesions              Urethra:  normal appearing urethra with no masses, tenderness or lesions              Bartholins and Skenes: normal                 Vagina: normal appearing vagina with normal color and  discharge, no lesions              Cervix: no lesions                Bimanual Exam:  Uterus:  normal size, contour, position, consistency, mobility, non-tender.  Exam is limited by [facility].               Adnexa: no mass, fullness, tenderness              Rectal exam: Yes.  .  Confirms.              Anus:  normal sphincter tone,  Multiple hemorrhoids.  Chaperone was present for exam.  ASSESSMENT  Dysmenorrhea.  Menorrhagia.  Hx rectal bleeding with menses and colonoscopy showing polyps.  Hemorrhoids.   Hx abnormal left mammogram. Due for follow up.  FH BRCA positive.  Paternal aunt positive.  FH breast cancer - 2 paternal aunts and paternal grandmother.  Hx diabetes.  Uncontrolled.  Obesity.  FH CAD.   PLAN  We had a long discussion regarding heavy and painful menses along with the rectal bleeding.  This may all represent endometriosis.   Endometriosis may be treated with medical therapy and conservative surgical care or hysterectomy with removal of tubes and ovaries.   Return for pelvic ultrasound and endometrial biopsy.  Needs CBC at her [place] visit.  She may need a repeat colonoscopy done during her menstruation.   Pap and HR HPV testing updated. Mammogram update for bilateral dx  mammogram and possible left breast US. Referral to genetics for counseling and BRCA testing.  Her test results may influence her treatment for her pain and bleeding.  We discussed guidelines for BSO for a positive status on BRCA testing.  A1C will be done soon.     An After Visit Summary was printed and given to the patient.  __45____ minutes face to face time of which over 50% was spent in counseling.

## 2018-02-21 NOTE — Progress Notes (Signed)
Patient scheduled while in office for bilateral Dx MMG and [place], if needed, on 02/28/18 at 11am at [facility]. Patient scheduled for PUS and EMB at [facility] on 02/23/18 at 10am, consult at 10:30am with Dr. Quincy Simmonds. Patient unable to take motrin, advised to take 2 regular strength tylenol 1 hr prior to procedure, eat and hydrate. Patient verbalizes understanding and is agreeable.

## 2018-02-22 ENCOUNTER — Telehealth: Payer: Self-pay | Admitting: Obstetrics and Gynecology

## 2018-02-22 NOTE — Telephone Encounter (Signed)
Call placed to convey benefits. 

## 2018-02-23 ENCOUNTER — Ambulatory Visit: Payer: Managed Care, Other (non HMO) | Admitting: Obstetrics and Gynecology

## 2018-02-23 ENCOUNTER — Ambulatory Visit (INDEPENDENT_AMBULATORY_CARE_PROVIDER_SITE_OTHER): Payer: Managed Care, Other (non HMO)

## 2018-02-23 VITALS — BP 134/76 | HR 84 | Resp 14 | Ht 65.5 in | Wt 245.0 lb

## 2018-02-23 DIAGNOSIS — N946 Dysmenorrhea, unspecified: Secondary | ICD-10-CM

## 2018-02-23 DIAGNOSIS — N920 Excessive and frequent menstruation with regular cycle: Secondary | ICD-10-CM

## 2018-02-23 DIAGNOSIS — R739 Hyperglycemia, unspecified: Secondary | ICD-10-CM | POA: Diagnosis not present

## 2018-02-23 MED ORDER — NORETHINDRONE 0.35 MG PO TABS: 1.0000 | ORAL_TABLET | Freq: Every day | ORAL | 0 refills | Status: DC

## 2018-02-23 NOTE — Progress Notes (Signed)
GYNECOLOGY  VISIT   HPI: 43 y.o.   Married  Caucasian  female   Melissa Gray with Patient's last menstrual period was 02/04/2018.   here for  Pelvic US and EMB for painful and irregular menses.   Rectal bleeding on her menses.  Normal colonoscopy but not done during her cycle.  GYNECOLOGIC HISTORY: Patient's last menstrual period was 02/04/2018. Contraception:  None, not sexually active.  Menopausal hormone therapy:  None Last mammogram:  Bi-rads 2 probably benign  Last pap smear:   1.28/20 results pending         OB History    Gravida  0   Para  0   Term  0   Preterm  0   AB  0   Living  0     SAB  0   TAB  0   Ectopic  0   Multiple  0   Live Births                 Patient Active Problem List   Diagnosis Date Noted  . Depression with anxiety 05/28/2016  . Uncontrolled type 2 diabetes mellitus without complication, without long-term current use of insulin ([place]) 01/29/2016  . Hypertriglyceridemia 07/02/2015  . Low HDL (under 40) 07/02/2015  . FH: BRCA gene positive 07/02/2015  . Morbid obesity ([place]) 06/03/2015    Past Medical History:  Diagnosis Date  . Anxiety   . Depression   . Diabetes mellitus without complication ([place])   . Elevated cholesterol   . Gallstones   . GERD (gastroesophageal reflux disease)   . Hematochezia 10/2017   Plan for colonoscopy  . Hyperplastic colon polyp 11/2017  . IBS (irritable bowel syndrome)    diarrhea predominant  . Infertility, female   . Obesity   . Thyroid disease 2004   treated for years, stopped 2014 per new MD    Past Surgical History:  Procedure Laterality Date  . CHOLECYSTECTOMY  2006  . CYST EXCISION    . GLOMUS TUMOR EXCISION  2005, 2012   finger and cheek    Current Outpatient Medications  Medication Sig Dispense Refill  . acetaminophen (TYLENOL) 325 MG tablet Take 325 mg by mouth every 6 (six) hours as needed. 2 tab once daily    . atorvastatin (LIPITOR) 40 MG tablet Take 1 tablet (40 mg total)  by mouth daily. 90 tablet 3  . glimepiride (AMARYL) 4 MG tablet Take 2 tablets (8 mg total) by mouth daily with breakfast. 180 tablet 1  . Insulin Pen Needle (PEN NEEDLES) 31G X 5 MM MISC 1 Device by Does not apply route daily. 100 each 3  . liraglutide (VICTOZA) 18 MG/3ML SOPN 0.6 mg SQ injection daily for 7 days, then increase to 1.2 mg sq inj daily. 6 pen 3  . naproxen (NAPROSYN) 500 MG tablet Take 1 tablet (500 mg total) by mouth 2 (two) times daily with a meal. 30 tablet 1  . sitaGLIPtin (JANUVIA) 100 MG tablet Take 1 tablet (100 mg total) by mouth daily. 90 tablet 1  . venlafaxine XR (EFFEXOR-XR) 150 MG 24 hr capsule Take 1 capsule (150 mg total) by mouth daily with breakfast. Needs appt prior to anymore refills 90 capsule 1   No current facility-administered medications for this visit.      ALLERGIES: Tape and Levothyroxine  Family History  Problem Relation Age of Onset  . Diabetes Mother   . COPD Father   . Heart disease Father   .  Early death Father   . Diabetes Brother   . Diabetes Maternal Grandmother   . Hearing loss Maternal Grandmother   . Dementia Maternal Grandmother   . Early death Maternal Grandfather   . Cancer Maternal Grandfather   . Breast cancer Paternal Grandmother   . Heart disease Paternal Grandfather   . Breast cancer Paternal Aunt   . Heart disease Paternal Uncle   . Heart attack Paternal Uncle   . Breast cancer Paternal Aunt   . Alcohol abuse Paternal Aunt   . Alzheimer's disease Paternal Aunt   . Colon cancer Neg Hx   . Esophageal cancer Neg Hx   . Rectal cancer Neg Hx   . Stomach cancer Neg Hx     Social History   Socioeconomic History  . Marital status: Married    Spouse name: Not on file  . Number of children: 0  . Years of education: Not on file  . Highest education level: Not on file  Occupational History  . Occupation: Presenter, broadcasting  Social Needs  . Financial resource strain: Not on file  . Food insecurity:    Worry:  Not on file    Inability: Not on file  . Transportation needs:    Medical: Not on file    Non-medical: Not on file  Tobacco Use  . Smoking status: Never Smoker  . Smokeless tobacco: Never Used  Substance and Sexual Activity  . Alcohol use: Yes    Alcohol/week: 0.0 standard drinks    Comment: ocassionally  . Drug use: No  . Sexual activity: Yes    Partners: Male    Birth control/protection: None  Lifestyle  . Physical activity:    Days per week: Not on file    Minutes per session: Not on file  . Stress: Not on file  Relationships  . Social connections:    Talks on phone: Not on file    Gets together: Not on file    Attends religious service: Not on file    Active member of club or organization: Not on file    Attends meetings of clubs or organizations: Not on file    Relationship status: Not on file  . Intimate partner violence:    Fear of current or ex partner: Not on file    Emotionally abused: Not on file    Physically abused: Not on file    Forced sexual activity: Not on file  Other Topics Concern  . Not on file  Social History Narrative   Married. Husband's name is Audelia Acton. No children.   2 pets.   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   Drinks caffeine   Wears her seatbelt   Smoke detector in home   Safe in her relationship.    Review of Systems  Gastrointestinal: Positive for abdominal pain, constipation and diarrhea.       Pain  Bloating  Change in quality of stools  Blood in stools   Genitourinary: Positive for vaginal bleeding.       Excess bleeding  Painful periods  Menstrual cycle change     PHYSICAL EXAMINATION:    BP 134/76   Pulse 84   Resp 14   Ht 5' 5.5" (1.664 m)   Wt 245 lb (111.1 kg)   LMP 02/04/2018   BMI 40.15 kg/m     General appearance: alert, cooperative and appears stated age   Pelvic US Uterus no masses.  EMS 9 mm. Cervical polyp.  Normal  ovaries.  No free fluid.   EMB: Consent for  procedure. Sterile prep with  Hibiclens Paracervical block with 1% lidocaine 6 cc, lot number    [id number], expiration  4/23. Tenaculum to anterior cervical lip. Pipelle passed to  9      cm twice.   Tissue to pathology.  Minimal EBL. No complications.   Chaperone was present for exam.  ASSESSMENT  Menorrhagia with irregular menses.  Dysmenorrhea.  Rectal bleeding with menses.  Cervical polyp on [place].  Paternal BRCA positive family member.  DM.  PLAN  FU EMB.  May start Micronor.   She will consider.  Consider consult with colon and rectal surgeon.  Check A1C and CBC.    An After Visit Summary was printed and given to the patient.  _15_____ minutes face to face time of which over 50% was spent in counseling.

## 2018-02-23 NOTE — Patient Instructions (Signed)

## 2018-02-24 ENCOUNTER — Encounter: Payer: Self-pay | Admitting: Obstetrics and Gynecology

## 2018-02-24 LAB — CBC
HEMOGLOBIN: 13.7 g/dL (ref 11.1–15.9)
Hematocrit: 41.5 % (ref 34.0–46.6)
MCH: 28.1 pg (ref 26.6–33.0)
MCHC: 33 g/dL (ref 31.5–35.7)
MCV: 85 fL (ref 79–97)
PLATELETS: 203 10*3/uL (ref 150–450)
RBC: 4.88 x10E6/uL (ref 3.77–5.28)
RDW: 15.4 % (ref 11.7–15.4)
WBC: 9.3 10*3/uL (ref 3.4–10.8)

## 2018-02-24 LAB — CYTOLOGY - PAP
Diagnosis: NEGATIVE
HPV (WINDOPATH): NOT DETECTED

## 2018-02-24 LAB — HEMOGLOBIN A1C
Est. average glucose Bld gHb Est-mCnc: 229 mg/dL
Hgb A1c MFr Bld: 9.6 % — ABNORMAL HIGH (ref 4.8–5.6)

## 2018-02-27 ENCOUNTER — Telehealth: Payer: Self-pay | Admitting: Emergency Medicine

## 2018-02-27 NOTE — Telephone Encounter (Signed)
-----   Message from Nunzio Cobbs, MD sent at 02/26/2018 11:34 AM EST ----- Please report results to patient.  Her endometrial biopsy showed benign proliferative endometrium.  Progesterone treatments to control her menstrual bleeding and pain are reasonable options.   Her hemoglobin A1C is still elevated significantly.  She will need to follow up with her primary care provider.   I would like for her diabetes to be under better control if we are going to be performing surgical care.  Elevated blood sugar is a risk factor for poor healing and infection following surgery.   Her blood counts are normal.   Please schedule an appointment with me if she would like to discuss further at this time.

## 2018-02-27 NOTE — Telephone Encounter (Signed)
Spoke with patient and she is given message from Dr. Quincy Simmonds.  Results discussed and she will follow up with her PCP.  She will follow up as needed and call back with any questions.   Encounter closed.

## 2018-02-28 ENCOUNTER — Other Ambulatory Visit: Payer: Managed Care, Other (non HMO)

## 2018-03-06 ENCOUNTER — Encounter: Payer: Self-pay | Admitting: Family Medicine

## 2018-03-10 ENCOUNTER — Ambulatory Visit
Admission: RE | Admit: 2018-03-10 | Discharge: 2018-03-10 | Disposition: A | Discharge status: Home / Self Care | Payer: Managed Care, Other (non HMO) | Source: Ambulatory Visit | Attending: Obstetrics and Gynecology | Admitting: Obstetrics and Gynecology

## 2018-03-10 ENCOUNTER — Ambulatory Visit: Payer: Managed Care, Other (non HMO)

## 2018-03-10 DIAGNOSIS — N6320 Unspecified lump in the left breast, unspecified quadrant: Secondary | ICD-10-CM

## 2018-03-16 ENCOUNTER — Encounter: Payer: Self-pay | Admitting: Genetic Counselor

## 2018-03-24 ENCOUNTER — Encounter: Payer: Self-pay | Admitting: Family Medicine

## 2018-03-24 ENCOUNTER — Ambulatory Visit: Payer: Managed Care, Other (non HMO) | Admitting: Family Medicine

## 2018-03-24 VITALS — BP 111/73 | HR 86 | Temp 98.3°F | Resp 16 | Ht 66.0 in | Wt 243.4 lb

## 2018-03-24 DIAGNOSIS — F418 Other specified anxiety disorders: Secondary | ICD-10-CM

## 2018-03-24 DIAGNOSIS — E786 Lipoprotein deficiency: Secondary | ICD-10-CM

## 2018-03-24 DIAGNOSIS — E781 Pure hyperglyceridemia: Secondary | ICD-10-CM

## 2018-03-24 DIAGNOSIS — E1165 Type 2 diabetes mellitus with hyperglycemia: Secondary | ICD-10-CM

## 2018-03-24 DIAGNOSIS — IMO0001 Reserved for inherently not codable concepts without codable children: Secondary | ICD-10-CM

## 2018-03-24 LAB — POCT GLYCOSYLATED HEMOGLOBIN (HGB A1C)
HbA1c POC (<> result, manual entry): 9.3 % (ref 4.0–5.6)
HbA1c, POC (controlled diabetic range): 9.3 % — AB (ref 0.0–7.0)
HbA1c, POC (prediabetic range): 9.3 % — AB (ref 5.7–6.4)
Hemoglobin A1C: 9.3 % — AB (ref 4.0–5.6)

## 2018-03-24 LAB — MICROALBUMIN / CREATININE URINE RATIO
Creatinine,U: 76.1 mg/dL
Microalb Creat Ratio: 0.9 mg/g (ref 0.0–30.0)
Microalb, Ur: <0.7 mg/dL (ref 0.0–1.9)

## 2018-03-24 MED ORDER — VENLAFAXINE HCL ER 150 MG PO CP24: 150.0000 mg | ORAL_CAPSULE | Freq: Every day | ORAL | 1 refills | Status: DC

## 2018-03-24 MED ORDER — LIRAGLUTIDE 18 MG/3ML ~~LOC~~ SOPN: 1.8000 mg | PEN_INJECTOR | Freq: Every day | SUBCUTANEOUS | 3 refills | Status: DC

## 2018-03-24 MED ORDER — SITAGLIPTIN PHOSPHATE 100 MG PO TABS: 100.0000 mg | ORAL_TABLET | Freq: Every day | ORAL | 1 refills | Status: DC

## 2018-03-24 MED ORDER — GLIMEPIRIDE 4 MG PO TABS: 8.0000 mg | ORAL_TABLET | Freq: Every day | ORAL | 1 refills | Status: DC

## 2018-03-24 NOTE — Patient Instructions (Signed)
a1c is improved to 9.3--> goal less than 7.  Routine exercise and low carbohydrate meals.  Meds refilled and are the same with the exception of increase in vicotza to 1.8 mg dose.   Please make sure to schedule your eye appts  F/u 3-4 mos.     Diabetes Mellitus and Exercise Exercising regularly is important for your overall health, especially when you have diabetes (diabetes mellitus). Exercising is not only about losing weight. It has many other health benefits, such as increasing muscle strength and bone density and reducing body fat and stress. This leads to improved fitness, flexibility, and endurance, all of which result in better overall health. Exercise has additional benefits for people with diabetes, including:  Reducing appetite.  Helping to lower and control blood glucose.  Lowering blood pressure.  Helping to control amounts of fatty substances (lipids) in the blood, such as cholesterol and triglycerides.  Helping the body to respond better to insulin (improving insulin sensitivity).  Reducing how much insulin the body needs.  Decreasing the risk for heart disease by: ? Lowering cholesterol and triglyceride levels. ? Increasing the levels of good cholesterol. ? Lowering blood glucose levels. What is my activity plan? Your health care provider or certified diabetes educator can help you make a plan for the type and frequency of exercise (activity plan) that works for you. Make sure that you:  Do at least 150 minutes of moderate-intensity or vigorous-intensity exercise each week. This could be brisk walking, biking, or water aerobics. ? Do stretching and strength exercises, such as yoga or weightlifting, at least 2 times a week. ? Spread out your activity over at least 3 days of the week.  Get some form of physical activity every day. ? Do not go more than 2 days in a row without some kind of physical activity. ? Avoid being inactive for more than 30 minutes at a  time. Take frequent breaks to walk or stretch.  Choose a type of exercise or activity that you enjoy, and set realistic goals.  Start slowly, and gradually increase the intensity of your exercise over time. What do I need to know about managing my diabetes?   Check your blood glucose before and after exercising. ? If your blood glucose is 240 mg/dL (13.3 mmol/L) or higher before you exercise, check your urine for ketones. If you have ketones in your urine, do not exercise until your blood glucose returns to normal. ? If your blood glucose is 100 mg/dL (5.6 mmol/L) or lower, eat a snack containing 15-20 grams of carbohydrate. Check your blood glucose 15 minutes after the snack to make sure that your level is above 100 mg/dL (5.6 mmol/L) before you start your exercise.  Know the symptoms of low blood glucose (hypoglycemia) and how to treat it. Your risk for hypoglycemia increases during and after exercise. Common symptoms of hypoglycemia can include: ? Hunger. ? Anxiety. ? Sweating and feeling clammy. ? Confusion. ? Dizziness or feeling light-headed. ? Increased heart rate or palpitations. ? Blurry vision. ? Tingling or numbness around the mouth, lips, or tongue. ? Tremors or shakes. ? Irritability.  Keep a rapid-acting carbohydrate snack available before, during, and after exercise to help prevent or treat hypoglycemia.  Avoid injecting insulin into areas of the body that are going to be exercised. For example, avoid injecting insulin into: ? The arms, when playing tennis. ? The legs, when jogging.  Keep records of your exercise habits. Doing this can help you and your  health care provider adjust your diabetes management plan as needed. Write down: ? Food that you eat before and after you exercise. ? Blood glucose levels before and after you exercise. ? The type and amount of exercise you have done. ? When your insulin is expected to peak, if you use insulin. Avoid exercising at  times when your insulin is peaking.  When you start a new exercise or activity, work with your health care provider to make sure the activity is safe for you, and to adjust your insulin, medicines, or food intake as needed.  Drink plenty of water while you exercise to prevent dehydration or heat stroke. Drink enough fluid to keep your urine clear or pale yellow. Summary  Exercising regularly is important for your overall health, especially when you have diabetes (diabetes mellitus).  Exercising has many health benefits, such as increasing muscle strength and bone density and reducing body fat and stress.  Your health care provider or certified diabetes educator can help you make a plan for the type and frequency of exercise (activity plan) that works for you.  When you start a new exercise or activity, work with your health care provider to make sure the activity is safe for you, and to adjust your insulin, medicines, or food intake as needed. This information is not intended to replace advice given to you by your health care provider. Make sure you discuss any questions you have with your health care provider. Document Released: 04/03/2003 Document Revised: 07/22/2016 Document Reviewed: 06/23/2015 Elsevier Interactive Patient Education  2019 [employer].

## 2018-03-24 NOTE — Progress Notes (Signed)
Melissa Gray , 1975/03/24, 43 y.o., female MRN: [id number] Patient Care Team    Relationship Specialty Notifications Start End  Ma Hillock, DO PCP - General Family Medicine  06/03/15   Lavena Bullion, DO Consulting Physician Gastroenterology  11/16/17    Chief Complaint  Patient presents with  . Diabetes    Fasting.     Subjective:  Uncontrolled type 2 diabetes mellitus without complication, without long-term current use of insulin (HCC)/Morbid obesity ([place]) Patient reports she has been compliant with januvia daily and amaryl 4 BID and victoza 1.2 mg QD.  Patient denies dizziness, hyperglycemic or hypoglycemic events. Patient denies numbness, tingling in the extremities or nonhealing wounds of feet. She does not monitor her blood sugars.  - she attended diabetes nutrition with her husband.  - Eye exam 03/01/2017--> encouraged to schedule.  - Foot exam completed 03/24/2018 - Prevnar administered 6/7//2017; PNA23 completed 05/28/2016 - flu shot encouraged yearly completed 10/2017 - microalbumin collected collected today  Depression with anxiety Still doing well on Effexor 150 mg daily. Denies side effects.     Hyperlipidemia: Tolerating statin. Lipids 12/05/2017 UTD.   Prior note 05/12/2016: Pt presents for an OV with complaints of worsening depression and anxiety of the last 6 months. She reports she has had depression and anxiety problems throughout her entire life. She has not talked about it or seen a provider for it in the past. She has never been on medications for this issue. She endorses becoming more emotional, breaking out in tears frequently, lack of motivation to do anything including bath, be social, do choirs., take her medicines and take care of her diabetes. She feels she has been snapping at her husband. She feels she is putting all the choirs/house responsibilities on him and it makes her feel guilty. She feels he has been a great support. She has been unable  to sleep well secondary to worrying and thinking instead of sleeping at bedtime. She thinks of "bad outcomes" of everyday subjects. She reports there is nothing in particular that causing her to feel this way, it is "just everything". She states she makes plans with her friends and then cancels a few hours prior. She knows she will have fun if she goes, but she is fearful she will have a panic attack. She reports having a few panic attacks recently, prior to that her last panic attack was about 5 years ago.   Mood disorder: negative today.  Depression screen [facility] 2/9 12/05/2017 08/31/2017 03/15/2017 02/16/2017 12/09/2016  Decreased Interest 0 0 0 3 0  Down, Depressed, Hopeless 1 0 1 3 0  PHQ - 2 Score 1 0 1 6 0  Altered sleeping 0 1 1 3  0  Tired, decreased energy 2 2 1 3  0  Change in appetite 0 0 1 3 0  Feeling bad or failure about yourself  0 0 0 3 0  Trouble concentrating 0 0 0 3 -  Moving slowly or fidgety/restless 0 0 0 - 0  Suicidal thoughts 0 0 0 0 0  PHQ-9 Score 3 3 4 21  0  Difficult doing work/chores Not difficult at all Not difficult at all - - -  Some recent data might be hidden   GAD 7 : Generalized Anxiety Score 12/05/2017 03/15/2017 02/16/2017 12/09/2016  Nervous, Anxious, on Edge 0 1 3 0  Control/stop worrying 0 1 3 0  Worry too much - different things 0 1 3 0  Trouble relaxing 1 1 3  0  Restless 0 0 3 0  Easily annoyed or irritable 0 1 3 0  Afraid - awful might happen 0 1 3 0  Total GAD 7 Score 1 6 21  0  Anxiety Difficulty Not difficult at all - Extremely difficult -     Allergies  Allergen Reactions  . Tape Other (See Comments)    Adhesive tape causes blisters  . Levothyroxine Rash    Generic brand only caused rash   Social History   Tobacco Use  . Smoking status: Never Smoker  . Smokeless tobacco: Never Used  Substance Use Topics  . Alcohol use: Yes    Alcohol/week: 0.0 standard drinks    Comment: ocassionally   Past Medical History:  Diagnosis Date  .  Anxiety   . Depression   . Diabetes mellitus without complication ([place])   . Elevated cholesterol   . Gallstones   . GERD (gastroesophageal reflux disease)   . Hematochezia 10/2017   Plan for colonoscopy  . Hyperplastic colon polyp 11/2017  . IBS (irritable bowel syndrome)    diarrhea predominant  . Infertility, female   . Obesity   . Thyroid disease 2004   treated for years, stopped 2014 per new MD   Past Surgical History:  Procedure Laterality Date  . CHOLECYSTECTOMY  2006  . CYST EXCISION    . GLOMUS TUMOR EXCISION  2005, 2012   finger and cheek   Family History  Problem Relation Age of Onset  . Diabetes Mother   . COPD Father   . Heart disease Father   . Early death Father   . Diabetes Brother   . Diabetes Maternal Grandmother   . Hearing loss Maternal Grandmother   . Dementia Maternal Grandmother   . Early death Maternal Grandfather   . Cancer Maternal Grandfather   . Breast cancer Paternal Grandmother   . Heart disease Paternal Grandfather   . Breast cancer Paternal Aunt   . Heart disease Paternal Uncle   . Heart attack Paternal Uncle   . Breast cancer Paternal Aunt   . Alcohol abuse Paternal Aunt   . Alzheimer's disease Paternal Aunt   . Colon cancer Neg Hx   . Esophageal cancer Neg Hx   . Rectal cancer Neg Hx   . Stomach cancer Neg Hx    Allergies as of 03/24/2018      Reactions   Tape Other (See Comments)   Adhesive tape causes blisters   Levothyroxine Rash   Generic brand only caused rash      Medication List       Accurate as of March 24, 2018  9:16 AM. Always use your most recent med list.        atorvastatin 40 MG tablet Commonly known as:  LIPITOR Take 1 tablet (40 mg total) by mouth daily.   glimepiride 4 MG tablet Commonly known as:  AMARYL Take 2 tablets (8 mg total) by mouth daily with breakfast.   liraglutide 18 MG/3ML Sopn Commonly known as:  VICTOZA Inject 0.3 mLs (1.8 mg total) into the skin daily.   norethindrone 0.35  MG tablet Commonly known as:  MICRONOR,CAMILA,ERRIN Take 1 tablet (0.35 mg total) by mouth daily.   Pen Needles 31G X 5 MM Misc 1 Device by Does not apply route daily.   sitaGLIPtin 100 MG tablet Commonly known as:  JANUVIA Take 1 tablet (100 mg total) by mouth daily.   venlafaxine XR 150 MG 24 hr capsule Commonly known as:  EFFEXOR-XR Take 1 capsule (150 mg total) by mouth daily with breakfast. Needs appt prior to anymore refills       Results for orders placed or performed in visit on 03/24/18 (from the past 24 hour(s))  POCT glycosylated hemoglobin (Hb A1C)     Status: Abnormal   Collection Time: 03/24/18  8:45 AM  Result Value Ref Range   Hemoglobin A1C 9.3 (A) 4.0 - 5.6 %   HbA1c POC (<> result, manual entry) 9.3 4.0 - 5.6 %   HbA1c, POC (prediabetic range) 9.3 (A) 5.7 - 6.4 %   HbA1c, POC (controlled diabetic range) 9.3 (A) 0.0 - 7.0 %   No results found.   ROS: Negative, with the exception of above mentioned in HPI   Objective:  BP 111/73 (BP Location: Left Arm, Patient Position: Sitting, Cuff Size: Normal)   Pulse 86   Temp 98.3 F (36.8 C) (Oral)   Resp 16   Ht 5\' 6"  (1.676 m)   Wt 243 lb 6 oz (110.4 kg)   LMP 03/09/2018   SpO2 96%   BMI 39.28 kg/m  Body mass index is 39.28 kg/m. Gen: Afebrile. No acute distress. Nontoxic. Obese.  HENT: AT. [place].  MMM.  Eyes:Pupils Equal Round Reactive to light, Extraocular movements intact,  Conjunctiva without redness, discharge or icterus. CV: RRR no murmur, no edema, +2/4 P posterior tibialis pulses Chest: CTAB, no wheeze or crackles Abd: Soft. NTND. BS presetn.  Skin: no rashes, purpura or petechiae.  Neuro:  Normal gait. PERLA. EOMi. Alert. Oriented.  Psych: Normal affect, dress and demeanor. Normal speech. Normal thought content and judgment.    Results for orders placed or performed in visit on 03/24/18 (from the past 24 hour(s))  POCT glycosylated hemoglobin (Hb A1C)     Status: Abnormal   Collection Time:  03/24/18  8:45 AM  Result Value Ref Range   Hemoglobin A1C 9.3 (A) 4.0 - 5.6 %   HbA1c POC (<> result, manual entry) 9.3 4.0 - 5.6 %   HbA1c, POC (prediabetic range) 9.3 (A) 5.7 - 6.4 %   HbA1c, POC (controlled diabetic range) 9.3 (A) 0.0 - 7.0 %   Diabetic Foot Exam - Simple   No data filed       Assessment/Plan: Melissa Gray is a 43 y.o. female present for OV for  Uncontrolled type 2 diabetes mellitus without complication, without long-term current use of insulin ([place]) Morbid obesity ([place]) -  Improved- still has much work to do. A1c 9.6--> 7.8 --> 9.3--> 9.6--> 10.8--> 9.3 .  - She does not want to start insulin.  - continue Januvia 100 mg daily.  - increase  Victoza to 1.8 mg QD -  Continue Amaryl to 8 mg daily, may split dose if desired  - start monitoring fasting sugars - she attended diabetes nutrition with her husband.  - Eye exam 03/01/2017--> encouraged to schedule.  - Foot exam completed 03/24/2018 - Prevnar administered 6/7//2017; PNA23 completed 05/28/2016 - flu shot encouraged yearly completed 10/2017 - microalbumin collected collected today - 3 months  Depression anxiety: -  stable refills today -  Effexor 150 mg daily, doing rather well. Refills provided.   - Follow-up in 3 months  Hypertriglyceridemia/On statin therapy - tolerating statin. Continue    Reviewed expectations re: course of current medical issues.  Discussed self-management of symptoms.  Outlined signs and symptoms indicating need for more acute intervention.  Patient verbalized understanding and all questions were answered.  Patient received an After-Visit  Summary.   > 25 minutes spent with patient, >50% of time spent face to face counseling   electronically signed by:  Howard Pouch, DO  [place]

## 2018-04-13 ENCOUNTER — Other Ambulatory Visit: Payer: Managed Care, Other (non HMO)

## 2018-06-10 ENCOUNTER — Other Ambulatory Visit: Payer: Self-pay | Admitting: Family Medicine

## 2018-06-13 ENCOUNTER — Telehealth: Payer: Self-pay | Admitting: Family Medicine

## 2018-06-13 ENCOUNTER — Encounter: Payer: Self-pay | Admitting: Family Medicine

## 2018-06-13 DIAGNOSIS — F418 Other specified anxiety disorders: Secondary | ICD-10-CM

## 2018-06-13 NOTE — Telephone Encounter (Signed)
Pt read mychart message.

## 2018-06-13 NOTE — Telephone Encounter (Signed)
Called pt and LM to check My Chart for information and to call if there is anything she needs. My chart message sent.

## 2018-06-13 NOTE — Telephone Encounter (Signed)
Pt wrote the following my chart message and would like a referral: Dr Raoul Pitch, you said there are people in your office I could talk to. Can you please refer me to someone I can talk to remotely? I am really struggling with my mental health right now.   I will give her the behavioral health numbers for [facility]. Please advise about referral

## 2018-06-13 NOTE — Telephone Encounter (Signed)
Please inform patient the following information: I have referred her to psychology (Dr. Carrington Clamp)  Per her request. They will call her to schedule- not certain of the wait period. [facility] and [facility] offers walk in services for mental health urgencies.  Please provide her with 24 hour crisis line as well, in the even she needs it .  If she would like to discuss and consider increasing medications please have her make a virtual visit here also.    ____________________________________________ Pt echart note.  Dr Raoul Pitch, you said there are people in your office I could talk to. Can you please refer me to someone I can talk to remotely? I am really struggling with my mental health right now.

## 2018-06-14 ENCOUNTER — Other Ambulatory Visit: Payer: Self-pay

## 2018-06-14 ENCOUNTER — Encounter: Payer: Self-pay | Admitting: Family Medicine

## 2018-06-14 ENCOUNTER — Ambulatory Visit (INDEPENDENT_AMBULATORY_CARE_PROVIDER_SITE_OTHER): Payer: Managed Care, Other (non HMO) | Admitting: Family Medicine

## 2018-06-14 VITALS — Temp 97.9°F | Ht 66.0 in

## 2018-06-14 DIAGNOSIS — F5105 Insomnia due to other mental disorder: Secondary | ICD-10-CM | POA: Diagnosis not present

## 2018-06-14 DIAGNOSIS — F418 Other specified anxiety disorders: Secondary | ICD-10-CM

## 2018-06-14 DIAGNOSIS — F99 Mental disorder, not otherwise specified: Secondary | ICD-10-CM | POA: Diagnosis not present

## 2018-06-14 MED ORDER — TRAZODONE HCL 50 MG PO TABS: 25.0000 mg | ORAL_TABLET | Freq: Every evening | ORAL | 0 refills | Status: DC | PRN

## 2018-06-14 NOTE — Progress Notes (Signed)
VIRTUAL VISIT VIA VIDEO  I connected with Melissa Gray on 06/14/18 at  1:00 PM EDT by a video enabled telemedicine application and verified that I am speaking with the correct person using two identifiers. Location patient: Home Location provider: [facility], Office Persons participating in the virtual visit: Patient, Melissa Gray and R.Baker, LPN  I discussed the limitations of evaluation and management by telemedicine and the availability of in person appointments. The patient expressed understanding and agreed to proceed.   SUBJECTIVE Chief Complaint  Patient presents with  . Anxiety    Pt would like to discuss medications and make changes.     HPI:  Melissa Gray is a 43 y.o. female present to discuss her worsening anxiety.  She has been prescribed Effexor 150 mg and has done rather well on the medication.  Recently she finds herself more stressed and anxious.  She is unable to sleep at night.  She started melatonin to help her sleep and it helps her fall asleep, which she cannot stay asleep.  She feels her lack of sleep is creating more stress in her life as well.  He is having difficulty focusing.  She has been referred to mental health counseling and has her first appointment coming up next Wednesday with Melissa Gray. Initial note: Pt presents for an OV with complaints of worsening depression and anxiety of the last 6 months. She reports she has had depression and anxiety problems throughout her entire life. She has not talked about it or seen a provider for it in the past. She has never been on medications for this issue. She endorses becoming more emotional, breaking out in tears frequently, lack of motivation to do anything including bath, be social, do choirs., take her medicines and take care of her diabetes. She feels she has been snapping at her husband. She feels she is putting all the choirs/house responsibilities on him and it makes her feel guilty. She feels he  has been a great support. She has been unable to sleep well secondary to worrying and thinking instead of sleeping at bedtime. She thinks of "bad outcomes" of everyday subjects. She reports there is nothing in particular that causing her to feel this way, it is "just everything". She states she makes plans with her friends and then cancels a few hours prior. She knows she will have fun if she goes, but she is fearful she will have a panic attack. She reports having a few panic attacks recently, prior to that her last panic attack was about 5 years ago.  ROS: See pertinent positives and negatives per HPI.  Patient Active Problem List   Diagnosis Date Noted  . Depression with anxiety 05/28/2016  . Uncontrolled type 2 diabetes mellitus without complication, without long-term current use of insulin ([place]) 01/29/2016  . Hypertriglyceridemia 07/02/2015  . Low HDL (under 40) 07/02/2015  . FH: BRCA gene positive 07/02/2015  . Morbid obesity ([place]) 06/03/2015    Social History   Tobacco Use  . Smoking status: Never Smoker  . Smokeless tobacco: Never Used  Substance Use Topics  . Alcohol use: Yes    Alcohol/week: 0.0 standard drinks    Comment: ocassionally    Current Outpatient Medications:  .  atorvastatin (LIPITOR) 40 MG tablet, TAKE 1 TABLET BY MOUTH EVERY DAY, Disp: 90 tablet, Rfl: 3 .  glimepiride (AMARYL) 4 MG tablet, Take 2 tablets (8 mg total) by mouth daily with breakfast., Disp: 180 tablet, Rfl: 1 .  Insulin Pen Needle (PEN NEEDLES) 31G X 5 MM MISC, 1 Device by Does not apply route daily., Disp: 100 each, Rfl: 3 .  liraglutide (VICTOZA) 18 MG/3ML SOPN, Inject 0.3 mLs (1.8 mg total) into the skin daily., Disp: 9 pen, Rfl: 3 .  Melatonin 5 MG TABS, Take 1 tablet by mouth at bedtime as needed., Disp: , Rfl:  .  sitaGLIPtin (JANUVIA) 100 MG tablet, Take 1 tablet (100 mg total) by mouth daily., Disp: 90 tablet, Rfl: 1 .  venlafaxine XR (EFFEXOR-XR) 150 MG 24 hr capsule, Take 1 capsule (150 mg  total) by mouth daily with breakfast. Needs appt prior to anymore refills, Disp: 90 capsule, Rfl: 1 .  norethindrone (MICRONOR,CAMILA,ERRIN) 0.35 MG tablet, Take 1 tablet (0.35 mg total) by mouth daily. (Patient not taking: Reported on 06/14/2018), Disp: 3 Package, Rfl: 0  Allergies  Allergen Reactions  . Tape Other (See Comments)    Adhesive tape causes blisters  . Levothyroxine Rash    Generic brand only caused rash    OBJECTIVE: Temp 97.9 F (36.6 C) (Oral)   Ht 5' 6"  (1.676 m)   LMP 05/31/2018 (Exact Date)   BMI 39.28 kg/m  Gen: No acute distress. Nontoxic in appearance.  HENT: AT. [place].  MMM.  Eyes:Pupils Equal Round Reactive to light, Extraocular movements intact,  Conjunctiva without redness, discharge or icterus. Chest: Cough or shortness of breath not present Neuro:  Alert. Oriented x3  Psych: Normal affect, dress and demeanor. Normal speech. Normal thought content and judgment.  Depression screen [facility] 2/9 06/14/2018 12/05/2017 08/31/2017 03/15/2017 02/16/2017  Decreased Interest 1 0 0 0 3  Down, Depressed, Hopeless 1 1 0 1 3  PHQ - 2 Score 2 1 0 1 6  Altered sleeping 3 0 1 1 3   Tired, decreased energy 1 2 2 1 3   Change in appetite 2 0 0 1 3  Feeling bad or failure about yourself  2 0 0 0 3  Trouble concentrating 1 0 0 0 3  Moving slowly or fidgety/restless 0 0 0 0 -  Suicidal thoughts 0 0 0 0 0  PHQ-9 Score 11 3 3 4 21   Difficult doing work/chores Very difficult Not difficult at all Not difficult at all - -  Some recent data might be hidden   GAD 7 : Generalized Anxiety Score 06/14/2018 12/05/2017 03/15/2017 02/16/2017  Nervous, Anxious, on Edge 1 0 1 3  Control/stop worrying 1 0 1 3  Worry too much - different things 1 0 1 3  Trouble relaxing 2 1 1 3   Restless 2 0 0 3  Easily annoyed or irritable 2 0 1 3  Afraid - awful might happen 2 0 1 3  Total GAD 7 Score 11 1 6 21   Anxiety Difficulty Very difficult Not difficult at all - Extremely difficult     ASSESSMENT AND  PLAN: Melissa Gray is a 43 y.o. female present for  Depression with anxiety/Insomnia due to other mental disorder -Discussed options with her today to help provide her with more coverage on her depression and anxiety/insomnia.   -Add trazodone 25-100 mg nightly.  Taper instructions provided to patient today.  Start had half a tab nightly and increase every 3 nights by half a tab if needed only.  Max dose 100 mg or 2 tabs. -She has been referred to psychology and has her first appointment next week. -SHe has follow-up scheduled in 3 weeks with her chronic medical conditions, will cover this at that  time and consider increasing her Effexor if needed. Follow-up in 3-4 weeks has already been scheduled.  > 15 minutes spent with patient, >50% of time spent face to face counseling     Melissa Pouch, DO 06/14/2018

## 2018-06-21 ENCOUNTER — Ambulatory Visit (INDEPENDENT_AMBULATORY_CARE_PROVIDER_SITE_OTHER): Payer: 59 | Admitting: Psychology

## 2018-06-21 DIAGNOSIS — F330 Major depressive disorder, recurrent, mild: Secondary | ICD-10-CM

## 2018-06-21 DIAGNOSIS — F4322 Adjustment disorder with anxiety: Secondary | ICD-10-CM | POA: Diagnosis not present

## 2018-06-29 ENCOUNTER — Ambulatory Visit (INDEPENDENT_AMBULATORY_CARE_PROVIDER_SITE_OTHER): Payer: 59 | Admitting: Psychology

## 2018-06-29 DIAGNOSIS — F4322 Adjustment disorder with anxiety: Secondary | ICD-10-CM | POA: Diagnosis not present

## 2018-06-29 DIAGNOSIS — F330 Major depressive disorder, recurrent, mild: Secondary | ICD-10-CM

## 2018-06-30 ENCOUNTER — Other Ambulatory Visit: Payer: Self-pay

## 2018-06-30 ENCOUNTER — Encounter: Payer: Self-pay | Admitting: Family Medicine

## 2018-06-30 ENCOUNTER — Ambulatory Visit (INDEPENDENT_AMBULATORY_CARE_PROVIDER_SITE_OTHER): Payer: Managed Care, Other (non HMO) | Admitting: Family Medicine

## 2018-06-30 VITALS — BP 122/82 | HR 95 | Temp 97.8°F | Resp 14 | Ht 66.0 in | Wt 238.0 lb

## 2018-06-30 DIAGNOSIS — E786 Lipoprotein deficiency: Secondary | ICD-10-CM | POA: Diagnosis not present

## 2018-06-30 DIAGNOSIS — E781 Pure hyperglyceridemia: Secondary | ICD-10-CM

## 2018-06-30 DIAGNOSIS — F99 Mental disorder, not otherwise specified: Secondary | ICD-10-CM

## 2018-06-30 DIAGNOSIS — F418 Other specified anxiety disorders: Secondary | ICD-10-CM | POA: Diagnosis not present

## 2018-06-30 DIAGNOSIS — F5105 Insomnia due to other mental disorder: Secondary | ICD-10-CM

## 2018-06-30 DIAGNOSIS — E1165 Type 2 diabetes mellitus with hyperglycemia: Secondary | ICD-10-CM | POA: Diagnosis not present

## 2018-06-30 DIAGNOSIS — IMO0001 Reserved for inherently not codable concepts without codable children: Secondary | ICD-10-CM

## 2018-06-30 LAB — LIPID PANEL
Cholesterol: 118 mg/dL (ref 0–200)
HDL: 24.2 mg/dL — ABNORMAL LOW (ref >39.00)
NonHDL: 94.05
Total CHOL/HDL Ratio: 5
Triglycerides: 361 mg/dL — ABNORMAL HIGH (ref 0.0–149.0)
VLDL: 72.2 mg/dL — ABNORMAL HIGH (ref 0.0–40.0)

## 2018-06-30 LAB — HEMOGLOBIN A1C: Hgb A1c MFr Bld: 9.7 % — ABNORMAL HIGH (ref 4.6–6.5)

## 2018-06-30 LAB — TSH: TSH: 3.64 u[IU]/mL (ref 0.35–4.50)

## 2018-06-30 LAB — LDL CHOLESTEROL, DIRECT: Direct LDL: 55 mg/dL

## 2018-06-30 MED ORDER — PEN NEEDLES 31G X 5 MM MISC: 1.0000 | Freq: Every day | 3 refills | Status: DC

## 2018-06-30 MED ORDER — GLIMEPIRIDE 4 MG PO TABS: 8.0000 mg | ORAL_TABLET | Freq: Every day | ORAL | 1 refills | Status: DC

## 2018-06-30 MED ORDER — TRAZODONE HCL 50 MG PO TABS: 25.0000 mg | ORAL_TABLET | Freq: Every evening | ORAL | 1 refills | Status: DC | PRN

## 2018-06-30 MED ORDER — LIRAGLUTIDE 18 MG/3ML ~~LOC~~ SOPN: 1.8000 mg | PEN_INJECTOR | Freq: Every day | SUBCUTANEOUS | 3 refills | Status: DC

## 2018-06-30 MED ORDER — VENLAFAXINE HCL ER 150 MG PO CP24: 150.0000 mg | ORAL_CAPSULE | Freq: Every day | ORAL | 1 refills | Status: DC

## 2018-06-30 MED ORDER — SITAGLIPTIN PHOSPHATE 100 MG PO TABS: 100.0000 mg | ORAL_TABLET | Freq: Every day | ORAL | 1 refills | Status: DC

## 2018-06-30 NOTE — Patient Instructions (Signed)
Follow up in 4 mos.   We will call you with results.  Make your eye appt soon.     Diabetes Mellitus and Exercise Exercising regularly is important for your overall health, especially when you have diabetes (diabetes mellitus). Exercising is not only about losing weight. It has many other health benefits, such as increasing muscle strength and bone density and reducing body fat and stress. This leads to improved fitness, flexibility, and endurance, all of which result in better overall health. Exercise has additional benefits for people with diabetes, including:  Reducing appetite.  Helping to lower and control blood glucose.  Lowering blood pressure.  Helping to control amounts of fatty substances (lipids) in the blood, such as cholesterol and triglycerides.  Helping the body to respond better to insulin (improving insulin sensitivity).  Reducing how much insulin the body needs.  Decreasing the risk for heart disease by: ? Lowering cholesterol and triglyceride levels. ? Increasing the levels of good cholesterol. ? Lowering blood glucose levels. What is my activity plan? Your health care provider or certified diabetes educator can help you make a plan for the type and frequency of exercise (activity plan) that works for you. Make sure that you:  Do at least 150 minutes of moderate-intensity or vigorous-intensity exercise each week. This could be brisk walking, biking, or water aerobics. ? Do stretching and strength exercises, such as yoga or weightlifting, at least 2 times a week. ? Spread out your activity over at least 3 days of the week.  Get some form of physical activity every day. ? Do not go more than 2 days in a row without some kind of physical activity. ? Avoid being inactive for more than 30 minutes at a time. Take frequent breaks to walk or stretch.  Choose a type of exercise or activity that you enjoy, and set realistic goals.  Start slowly, and gradually increase  the intensity of your exercise over time. What do I need to know about managing my diabetes?   Check your blood glucose before and after exercising. ? If your blood glucose is 240 mg/dL (13.3 mmol/L) or higher before you exercise, check your urine for ketones. If you have ketones in your urine, do not exercise until your blood glucose returns to normal. ? If your blood glucose is 100 mg/dL (5.6 mmol/L) or lower, eat a snack containing 15-20 grams of carbohydrate. Check your blood glucose 15 minutes after the snack to make sure that your level is above 100 mg/dL (5.6 mmol/L) before you start your exercise.  Know the symptoms of low blood glucose (hypoglycemia) and how to treat it. Your risk for hypoglycemia increases during and after exercise. Common symptoms of hypoglycemia can include: ? Hunger. ? Anxiety. ? Sweating and feeling clammy. ? Confusion. ? Dizziness or feeling light-headed. ? Increased heart rate or palpitations. ? Blurry vision. ? Tingling or numbness around the mouth, lips, or tongue. ? Tremors or shakes. ? Irritability.  Keep a rapid-acting carbohydrate snack available before, during, and after exercise to help prevent or treat hypoglycemia.  Avoid injecting insulin into areas of the body that are going to be exercised. For example, avoid injecting insulin into: ? The arms, when playing tennis. ? The legs, when jogging.  Keep records of your exercise habits. Doing this can help you and your health care provider adjust your diabetes management plan as needed. Write down: ? Food that you eat before and after you exercise. ? Blood glucose levels before and after  you exercise. ? The type and amount of exercise you have done. ? When your insulin is expected to peak, if you use insulin. Avoid exercising at times when your insulin is peaking.  When you start a new exercise or activity, work with your health care provider to make sure the activity is safe for you, and to  adjust your insulin, medicines, or food intake as needed.  Drink plenty of water while you exercise to prevent dehydration or heat stroke. Drink enough fluid to keep your urine clear or pale yellow. Summary  Exercising regularly is important for your overall health, especially when you have diabetes (diabetes mellitus).  Exercising has many health benefits, such as increasing muscle strength and bone density and reducing body fat and stress.  Your health care provider or certified diabetes educator can help you make a plan for the type and frequency of exercise (activity plan) that works for you.  When you start a new exercise or activity, work with your health care provider to make sure the activity is safe for you, and to adjust your insulin, medicines, or food intake as needed. This information is not intended to replace advice given to you by your health care provider. Make sure you discuss any questions you have with your health care provider. Document Released: 04/03/2003 Document Revised: 07/22/2016 Document Reviewed: 06/23/2015 Elsevier Interactive Patient Education  2019 [employer].

## 2018-06-30 NOTE — Progress Notes (Signed)
Melissa Gray , 30-Nov-1975, 43 y.o., female MRN: [id number] Patient Care Team    Relationship Specialty Notifications Start End  Ma Hillock, DO PCP - General Family Medicine  06/03/15   Lavena Bullion, DO Consulting Physician Gastroenterology  11/16/17   Ivan Anchors, [facility] Counselor Licensed Clinical Social Worker  06/14/18    Chief Complaint  Patient presents with  . Diabetes    Patient states that she is doing well.  No complaints. Medication list reviewed and is correct per patient.     Subjective:  Uncontrolled type 2 diabetes mellitus without complication, without long-term current use of insulin (HCC)/Morbid obesity ([place]) Patient reports she has been compliance with [place] daily and amaryl 4 BID.  She does admit she forgets to take the Victoza a couple times a week.  Patient denies dizziness, hyperglycemic or hypoglycemic events. Patient denies numbness, tingling in the extremities or nonhealing wounds of feet.  Does not monitor her blood glucose levels, her preference.  She does have supplies. - she attended diabetes nutrition with her husband.  - Eye exam 03/01/2017--> encouraged to schedule.  - Foot exam completed 03/24/2018 - Prevnar administered 6/7//2017; PNA23 completed 05/28/2016 - flu shot encouraged yearly completed 10/2017 - microalbumin UTD 03/24/2018 -A1c; 7.8> 9.3> 9.6> 10.8> 9.6> 9.3> ordered today.  Depression with anxiety/insomnia Patient reports she has seen some improvement since adding the trazodone to the Effexor 150 mg daily.  She has been referred to a therapist.  She would like to continue the trazodone 25 mg nightly and Effexor 150 mg daily.  Hyperlipidemia: SHe is now taking the statin medication and tolerating. .    Depression screen [facility] 2/9 06/14/2018 12/05/2017 08/31/2017 03/15/2017 02/16/2017  Decreased Interest 1 0 0 0 3  Down, Depressed, Hopeless 1 1 0 1 3  PHQ - 2 Score 2 1 0 1 6  Altered sleeping 3 0 1 1 3   Tired, decreased  energy 1 2 2 1 3   Change in appetite 2 0 0 1 3  Feeling bad or failure about yourself  2 0 0 0 3  Trouble concentrating 1 0 0 0 3  Moving slowly or fidgety/restless 0 0 0 0 -  Suicidal thoughts 0 0 0 0 0  PHQ-9 Score 11 3 3 4 21   Difficult doing work/chores Very difficult Not difficult at all Not difficult at all - -  Some recent data might be hidden   GAD 7 : Generalized Anxiety Score 06/14/2018 12/05/2017 03/15/2017 02/16/2017  Nervous, Anxious, on Edge 1 0 1 3  Control/stop worrying 1 0 1 3  Worry too much - different things 1 0 1 3  Trouble relaxing 2 1 1 3   Restless 2 0 0 3  Easily annoyed or irritable 2 0 1 3  Afraid - awful might happen 2 0 1 3  Total GAD 7 Score 11 1 6 21   Anxiety Difficulty Very difficult Not difficult at all - Extremely difficult     Allergies  Allergen Reactions  . Tape Other (See Comments)    Adhesive tape causes blisters  . Levothyroxine Rash    Generic brand only caused rash   Social History   Tobacco Use  . Smoking status: Never Smoker  . Smokeless tobacco: Never Used  Substance Use Topics  . Alcohol use: Yes    Alcohol/week: 0.0 standard drinks    Comment: ocassionally   Past Medical History:  Diagnosis Date  . Anxiety   . Depression   .  Diabetes mellitus without complication ([place])   . Elevated cholesterol   . Gallstones   . GERD (gastroesophageal reflux disease)   . Hematochezia 10/2017   Plan for colonoscopy  . Hyperplastic colon polyp 11/2017  . IBS (irritable bowel syndrome)    diarrhea predominant  . Infertility, female   . Obesity   . Thyroid disease 2004   treated for years, stopped 2014 per new MD   Past Surgical History:  Procedure Laterality Date  . CHOLECYSTECTOMY  2006  . CYST EXCISION    . GLOMUS TUMOR EXCISION  2005, 2012   finger and cheek   Family History  Problem Relation Age of Onset  . Diabetes Mother   . COPD Father   . Heart disease Father   . Early death Father   . Diabetes Brother   . Diabetes  Maternal Grandmother   . Hearing loss Maternal Grandmother   . Dementia Maternal Grandmother   . Early death Maternal Grandfather   . Cancer Maternal Grandfather   . Breast cancer Paternal Grandmother   . Heart disease Paternal Grandfather   . Breast cancer Paternal Aunt   . Heart disease Paternal Uncle   . Heart attack Paternal Uncle   . Breast cancer Paternal Aunt   . Alcohol abuse Paternal Aunt   . Alzheimer's disease Paternal Aunt   . Colon cancer Neg Hx   . Esophageal cancer Neg Hx   . Rectal cancer Neg Hx   . Stomach cancer Neg Hx    Allergies as of 06/30/2018      Reactions   Tape Other (See Comments)   Adhesive tape causes blisters   Levothyroxine Rash   Generic brand only caused rash      Medication List       Accurate as of June 30, 2018  8:14 AM. If you have any questions, ask your nurse or doctor.        atorvastatin 40 MG tablet Commonly known as:  LIPITOR TAKE 1 TABLET BY MOUTH EVERY DAY   glimepiride 4 MG tablet Commonly known as:  AMARYL Take 2 tablets (8 mg total) by mouth daily with breakfast.   liraglutide 18 MG/3ML Sopn Commonly known as:  VICTOZA Inject 0.3 mLs (1.8 mg total) into the skin daily.   Melatonin 5 MG Tabs Take 1 tablet by mouth at bedtime as needed.   norethindrone 0.35 MG tablet Commonly known as:  MICRONOR Take 1 tablet (0.35 mg total) by mouth daily.   Pen Needles 31G X 5 MM Misc 1 Device by Does not apply route daily.   sitaGLIPtin 100 MG tablet Commonly known as:  JANUVIA Take 1 tablet (100 mg total) by mouth daily.   traZODone 50 MG tablet Commonly known as:  DESYREL Take 0.5-2 tablets (25-100 mg total) by mouth at bedtime as needed for sleep. Start 1/2 tab QHS, increase by 1/2 tab every 3 nights if needed only. Max dose 2 tabs (100 mg)   venlafaxine XR 150 MG 24 hr capsule Commonly known as:  EFFEXOR-XR Take 1 capsule (150 mg total) by mouth daily with breakfast. Needs appt prior to anymore refills       No  results found for this or any previous visit (from the past 24 hour(s)). No results found.   ROS: Negative, with the exception of above mentioned in HPI   Objective:  BP 122/82   Pulse 95   Temp 97.8 F (36.6 C)   Resp 14   Ht  5\' 6"  (1.676 m)   Wt 253 lb (114.8 kg)   LMP 05/31/2018 (Exact Date)   SpO2 96%   BMI 40.84 kg/m  Body mass index is 40.84 kg/m. Gen: Afebrile. No acute distress.  Nontoxic in appearance, well-developed, well-nourished, obese Caucasian female. HENT: AT. [place].  MMM.  Eyes:Pupils Equal Round Reactive to light, Extraocular movements intact,  Conjunctiva without redness, discharge or icterus. Neck/lymp/endocrine: Supple, no lymphadenopathy, no thyromegaly CV: RRR no murmur, no edema, +2/4 P posterior tibialis pulses Chest: CTAB, no wheeze or crackles Neuro:  Normal gait. PERLA. EOMi. Alert. Oriented.  Psych: Normal affect, dress and demeanor. Normal speech. Normal thought content and judgment.   Assessment/Plan: Kawena Lyday is a 43 y.o. female present for OV for  Uncontrolled type 2 diabetes mellitus without complication, without long-term current use of insulin ([place]) Morbid obesity ([place]) -  Improved- still has much work to do. A1c 9.6--> 7.8 --> 9.3--> 9.6--> 10.8--> 9.3>> .  - She does not want to start insulin.  - continue Januvia 100 mg daily.  - increase  Victoza to 1.8 mg QD -  Continue Amaryl to 8 mg daily, may split dose if desired  -  consider SGLi if still above 7 after check - start monitoring fasting sugars>> which she continues to not complete. - she attended diabetes nutrition with her husband.  - Eye exam 03/01/2017--> encouraged to schedule.  - Foot exam completed 03/24/2018 - Prevnar administered 6/7//2017; PNA23 completed 05/28/2016 - flu shot encouraged yearly completed 10/2017 - microalbumin UTD 03/24/2018 -A1c; 7.8> 9.3> 9.6> 10.8> 9.6> 9.3> ordered today. - 3 months  Depression anxiety/insomnia: - Improved after adding  trazodone.  Continue Effexor 150 mg daily.  Continue trazodone 25 mg nightly.  Refills provided on each today. -Referred to therapist last appointment. - Follow-up in 3 months  Hypertriglyceridemia/On statin therapy -Tolerating statin.  Orders Placed This Encounter  Procedures  . Hemoglobin A1c  . Lipid panel  . TSH     Reviewed expectations re: course of current medical issues.  Discussed self-management of symptoms.  Outlined signs and symptoms indicating need for more acute intervention.  Patient verbalized understanding and all questions were answered.  Patient received an After-Visit Summary.   > 25 minutes spent with patient, >50% of time spent face to face counseling   electronically signed by:  Howard Pouch, DO  [place]

## 2018-06-30 NOTE — Progress Notes (Signed)
Pre visit review using our clinic review tool, if applicable. No additional management support is needed unless otherwise documented below in the visit note. 

## 2018-07-03 ENCOUNTER — Telehealth: Payer: Self-pay | Admitting: Family Medicine

## 2018-07-03 MED ORDER — DAPAGLIFLOZIN PROPANEDIOL 5 MG PO TABS: 5.0000 mg | ORAL_TABLET | Freq: Every day | ORAL | 1 refills | Status: DC

## 2018-07-03 NOTE — Telephone Encounter (Signed)
Please inform patient the following information: - Her thyroid panel is normal.  - Her triglycerides are still elevated 361. >> it does not appear any of the medications dedicated to lowering triglycerides only are on her formulary. START omega 3 (fish oil) 3000 mg a day.  Her a1c went up from 9.3 to 9.7.   - Continue amaryl, januvia, victoza.   - Start farxiga.    - If unable to gain better control of a1c with adding new med to regimen>>> will need to refer to endocrine or start insulin  F/u 3-4 mos

## 2018-07-03 NOTE — Telephone Encounter (Signed)
Pt was called and given results, pt verbalized understanding  

## 2018-07-07 ENCOUNTER — Ambulatory Visit (INDEPENDENT_AMBULATORY_CARE_PROVIDER_SITE_OTHER): Payer: 59 | Admitting: Psychology

## 2018-07-07 DIAGNOSIS — F4322 Adjustment disorder with anxiety: Secondary | ICD-10-CM | POA: Diagnosis not present

## 2018-07-18 ENCOUNTER — Ambulatory Visit (INDEPENDENT_AMBULATORY_CARE_PROVIDER_SITE_OTHER): Payer: 59 | Admitting: Psychology

## 2018-07-18 DIAGNOSIS — F330 Major depressive disorder, recurrent, mild: Secondary | ICD-10-CM

## 2018-07-18 DIAGNOSIS — F4323 Adjustment disorder with mixed anxiety and depressed mood: Secondary | ICD-10-CM

## 2018-07-25 ENCOUNTER — Ambulatory Visit (INDEPENDENT_AMBULATORY_CARE_PROVIDER_SITE_OTHER): Payer: 59 | Admitting: Psychology

## 2018-07-25 DIAGNOSIS — F330 Major depressive disorder, recurrent, mild: Secondary | ICD-10-CM | POA: Diagnosis not present

## 2018-07-25 DIAGNOSIS — F4322 Adjustment disorder with anxiety: Secondary | ICD-10-CM | POA: Diagnosis not present

## 2018-07-26 DIAGNOSIS — U071 COVID-19: Secondary | ICD-10-CM

## 2018-07-26 HISTORY — DX: COVID-19: U07.1

## 2018-08-08 ENCOUNTER — Ambulatory Visit (INDEPENDENT_AMBULATORY_CARE_PROVIDER_SITE_OTHER): Payer: 59 | Admitting: Psychology

## 2018-08-08 DIAGNOSIS — F4322 Adjustment disorder with anxiety: Secondary | ICD-10-CM

## 2018-08-17 ENCOUNTER — Ambulatory Visit (INDEPENDENT_AMBULATORY_CARE_PROVIDER_SITE_OTHER): Payer: 59 | Admitting: Psychology

## 2018-08-17 DIAGNOSIS — F4322 Adjustment disorder with anxiety: Secondary | ICD-10-CM | POA: Diagnosis not present

## 2018-08-17 DIAGNOSIS — F330 Major depressive disorder, recurrent, mild: Secondary | ICD-10-CM

## 2019-02-02 ENCOUNTER — Ambulatory Visit: Payer: Managed Care, Other (non HMO) | Admitting: Family Medicine

## 2019-02-02 ENCOUNTER — Other Ambulatory Visit: Payer: Self-pay

## 2019-02-02 ENCOUNTER — Encounter: Payer: Self-pay | Admitting: Family Medicine

## 2019-02-02 VITALS — BP 134/83 | HR 101 | Temp 97.4°F | Resp 16 | Ht 66.0 in | Wt 239.4 lb

## 2019-02-02 DIAGNOSIS — F5105 Insomnia due to other mental disorder: Secondary | ICD-10-CM

## 2019-02-02 DIAGNOSIS — F418 Other specified anxiety disorders: Secondary | ICD-10-CM | POA: Diagnosis not present

## 2019-02-02 DIAGNOSIS — F99 Mental disorder, not otherwise specified: Secondary | ICD-10-CM

## 2019-02-02 DIAGNOSIS — Z23 Encounter for immunization: Secondary | ICD-10-CM

## 2019-02-02 DIAGNOSIS — E119 Type 2 diabetes mellitus without complications: Secondary | ICD-10-CM

## 2019-02-02 DIAGNOSIS — N898 Other specified noninflammatory disorders of vagina: Secondary | ICD-10-CM

## 2019-02-02 DIAGNOSIS — E781 Pure hyperglyceridemia: Secondary | ICD-10-CM

## 2019-02-02 LAB — POCT GLYCOSYLATED HEMOGLOBIN (HGB A1C)
HbA1c POC (<> result, manual entry): 8.7 % (ref 4.0–5.6)
HbA1c, POC (controlled diabetic range): 8.7 % — AB (ref 0.0–7.0)
HbA1c, POC (prediabetic range): 8.7 % — AB (ref 5.7–6.4)
Hemoglobin A1C: 8.7 % — AB (ref 4.0–5.6)

## 2019-02-02 MED ORDER — GLIMEPIRIDE 4 MG PO TABS: 8.0000 mg | ORAL_TABLET | Freq: Every day | ORAL | 1 refills | Status: DC

## 2019-02-02 MED ORDER — VENLAFAXINE HCL ER 150 MG PO CP24: 150.0000 mg | ORAL_CAPSULE | Freq: Every day | ORAL | 1 refills | Status: DC

## 2019-02-02 MED ORDER — SITAGLIPTIN PHOSPHATE 100 MG PO TABS: 100.0000 mg | ORAL_TABLET | Freq: Every day | ORAL | 1 refills | Status: DC

## 2019-02-02 MED ORDER — FLUCONAZOLE 150 MG PO TABS: 150.0000 mg | ORAL_TABLET | Freq: Once | ORAL | 0 refills | Status: AC

## 2019-02-02 MED ORDER — ATORVASTATIN CALCIUM 40 MG PO TABS: 40.0000 mg | ORAL_TABLET | Freq: Every day | ORAL | 3 refills | Status: DC

## 2019-02-02 MED ORDER — TRAZODONE HCL 50 MG PO TABS: 25.0000 mg | ORAL_TABLET | Freq: Every evening | ORAL | 1 refills | Status: DC | PRN

## 2019-02-02 MED ORDER — FARXIGA 10 MG PO TABS: 10.0000 mg | ORAL_TABLET | Freq: Every day | ORAL | 1 refills | Status: DC

## 2019-02-02 NOTE — Patient Instructions (Signed)
  covid vaccines: Pay attention to the news the next few weeks- COVID vaccines are starting for the community.  Appointments are required and can be made beginning at 8 a.m. Once your age bracket is called (watch news and visit website below for info) appt can be made by calling [phone] and selecting option 2. Walk-ins will not be accepted.                  Clinic locations are: [facility], [place]; [facility] [address], [place]; . [place] at [facility], [id number], [place].  Participants are asked to wear a face covering at vaccination sites.  Visit [email or web address] and click on the "[id number] Vaccine Info" rectangle for more information about vaccinations.   Your a1c is better... keep up the good work. For now we increase the [place] and not restart the victoza. Other meds the same.

## 2019-02-02 NOTE — Progress Notes (Signed)
Melissa Gray , 1975-03-01, 44 y.o., female MRN: [id number] Patient Care Team    Relationship Specialty Notifications Start End  Ma Hillock, DO PCP - General Family Medicine  06/03/15   Lavena Bullion, DO Consulting Physician Gastroenterology  11/16/17   Ivan Anchors, [facility] Counselor Licensed Clinical Social Worker  06/14/18    Chief Complaint  Patient presents with  . Diabetes    Needs medication refills on all medications.  . Anxiety  . Depression    Subjective:  Uncontrolled type 2 diabetes mellitus without complication, without long-term current use of insulin (HCC)/Morbid obesity ([place]) Patient reports she has been compliance with Farxiga 5 mg daily, januvia daily and amaryl 4 BID.  She has not had the Victoza in over a few months.  She reports she has been working on her diet and has lost weight. Patient denies dizziness, hyperglycemic or hypoglycemic events. Patient denies numbness, tingling in the extremities or nonhealing wounds of feet.  Does not monitor her blood glucose levels, her preference.  She does have supplies. - she attended diabetes nutrition with her husband.  - Eye exam 03/01/2017--> encouraged to schedule.  - Foot exam completed 03/24/2018 - Prevnar administered 6/7//2017; PNA23 completed 05/28/2016 - flu shot encouraged yearly completed today - microalbumin UTD 03/24/2018 -A1c; 7.8> 9.3> 9.6> 10.8> 9.6> 9.3> 8.7 today  Depression with anxiety/insomnia Patient reports she is doing well on current medications and doses.  Patient reports she has seen some improvement since adding the trazodone to the Effexor 150 mg daily.  She has been referred to a therapist.  She would like to continue the trazodone 25 mg nightly and Effexor 150 mg daily.  Hyperlipidemia: She reports compliance with lipitor.  .    Depression screen [facility] 2/9 02/02/2019 06/14/2018 12/05/2017 08/31/2017 03/15/2017  Decreased Interest 1 1 0 0 0  Down, Depressed, Hopeless 1 1 1  0 1  PHQ -  2 Score 2 2 1  0 1  Altered sleeping 3 3 0 1 1  Tired, decreased energy 1 1 2 2 1   Change in appetite 1 2 0 0 1  Feeling bad or failure about yourself  1 2 0 0 0  Trouble concentrating 1 1 0 0 0  Moving slowly or fidgety/restless 0 0 0 0 0  Suicidal thoughts 0 0 0 0 0  PHQ-9 Score 9 11 3 3 4   Difficult doing work/chores Somewhat difficult Very difficult Not difficult at all Not difficult at all -  Some recent data might be hidden   GAD 7 : Generalized Anxiety Score 02/02/2019 06/14/2018 12/05/2017 03/15/2017  Nervous, Anxious, on Edge 2 1 0 1  Control/stop worrying 2 1 0 1  Worry too much - different things 2 1 0 1  Trouble relaxing 2 2 1 1   Restless 2 2 0 0  Easily annoyed or irritable 2 2 0 1  Afraid - awful might happen 2 2 0 1  Total GAD 7 Score 14 11 1 6   Anxiety Difficulty Somewhat difficult Very difficult Not difficult at all -     Allergies  Allergen Reactions  . Tape Other (See Comments)    Adhesive tape causes blisters  . Levothyroxine Rash    Generic brand only caused rash   Social History   Tobacco Use  . Smoking status: Never Smoker  . Smokeless tobacco: Never Used  Substance Use Topics  . Alcohol use: Yes    Alcohol/week: 0.0 standard drinks    Comment:  ocassionally   Past Medical History:  Diagnosis Date  . Anxiety   . Depression   . Diabetes mellitus without complication ([place])   . Elevated cholesterol   . Gallstones   . GERD (gastroesophageal reflux disease)   . Hematochezia 10/2017   Plan for colonoscopy  . Hyperplastic colon polyp 11/2017  . IBS (irritable bowel syndrome)    diarrhea predominant  . Infertility, female   . Obesity   . Thyroid disease 2004   treated for years, stopped 2014 per new MD   Past Surgical History:  Procedure Laterality Date  . CHOLECYSTECTOMY  2006  . CYST EXCISION    . GLOMUS TUMOR EXCISION  2005, 2012   finger and cheek   Family History  Problem Relation Age of Onset  . Diabetes Mother   . COPD Father   .  Heart disease Father   . Early death Father   . Diabetes Brother   . Diabetes Maternal Grandmother   . Hearing loss Maternal Grandmother   . Dementia Maternal Grandmother   . Early death Maternal Grandfather   . Cancer Maternal Grandfather   . Breast cancer Paternal Grandmother   . Heart disease Paternal Grandfather   . Breast cancer Paternal Aunt   . Heart disease Paternal Uncle   . Heart attack Paternal Uncle   . Breast cancer Paternal Aunt   . Alcohol abuse Paternal Aunt   . Alzheimer's disease Paternal Aunt   . Colon cancer Neg Hx   . Esophageal cancer Neg Hx   . Rectal cancer Neg Hx   . Stomach cancer Neg Hx    Allergies as of 02/02/2019      Reactions   Tape Other (See Comments)   Adhesive tape causes blisters   Levothyroxine Rash   Generic brand only caused rash      Medication List       Accurate as of February 02, 2019 11:59 PM. If you have any questions, ask your nurse or doctor.        atorvastatin 40 MG tablet Commonly known as: LIPITOR Take 1 tablet (40 mg total) by mouth daily.   Farxiga 10 MG Tabs tablet Generic drug: dapagliflozin propanediol Take 10 mg by mouth daily before breakfast. What changed:   medication strength  how much to take  when to take this Changed by: Howard Pouch, DO   fluconazole 150 MG tablet Commonly known as: DIFLUCAN Take 1 tablet (150 mg total) by mouth once for 1 dose. Started by: Howard Pouch, DO   glimepiride 4 MG tablet Commonly known as: AMARYL Take 2 tablets (8 mg total) by mouth daily with breakfast.   liraglutide 18 MG/3ML Sopn Commonly known as: VICTOZA Inject 0.3 mLs (1.8 mg total) into the skin daily.   Pen Needles 31G X 5 MM Misc 1 Device by Does not apply route daily.   sitaGLIPtin 100 MG tablet Commonly known as: JANUVIA Take 1 tablet (100 mg total) by mouth daily.   traZODone 50 MG tablet Commonly known as: DESYREL Take 0.5 tablets (25 mg total) by mouth at bedtime as needed for sleep. Start  1/2 tab QHS, increase by 1/2 tab every 3 nights if needed only. Max dose 2 tabs (100 mg)   venlafaxine XR 150 MG 24 hr capsule Commonly known as: EFFEXOR-XR Take 1 capsule (150 mg total) by mouth daily with breakfast. Needs appt prior to anymore refills       No results found for this or any  previous visit (from the past 24 hour(s)). No results found.   ROS: Negative, with the exception of above mentioned in HPI   Objective:  BP 134/83 (BP Location: Right Arm, Patient Position: Sitting, Cuff Size: Normal)   Pulse (!) 101   Temp (!) 97.4 F (36.3 C) (Temporal)   Resp 16   Ht 5\' 6"  (1.676 m)   Wt 239 lb 6 oz (108.6 kg)   LMP 12/18/2018 (Exact Date)   SpO2 98%   BMI 38.64 kg/m  Body mass index is 38.64 kg/m. Gen: Afebrile. No acute distress.  Nontoxic in presentation well-developed, well-nourished, obese, pleasant Caucasian female. HENT: AT. [place].  Eyes:Pupils Equal Round Reactive to light, Extraocular movements intact,  Conjunctiva without redness, discharge or icterus. Neck/lymp/endocrine: Supple, no lymphadenopathy, no thyromegaly CV: RRR no murmur, no edema, +2/4 P posterior tibialis pulses Chest: CTAB, no wheeze or crackles Skin: No rashes, purpura or petechiae.  Neuro:  Normal gait. PERLA. EOMi. Alert. Oriented.  Psych: Normal affect, dress and demeanor. Normal speech. Normal thought content and judgment..   No results found for this or any previous visit (from the past 24 hour(s)).  Assessment/Plan: Melissa Gray is a 44 y.o. female present for OV for  Uncontrolled type 2 diabetes mellitus without complication, without long-term current use of insulin ([place]) Morbid obesity ([place]) -  Improved-despite her not taking the Victoza. - She does not want to start insulin.  - continue Januvia 100 mg daily.  - Discontinue Victoza to 1.8 mg QD for now.  May add back at lower dose if necessary after increasing Farxiga and start her A1c is lower today than it has been prior and  she has not been using this medication. -  Continue Amaryl to 8 mg daily, may split dose if desired  - Increase Farxiga to 10 mg daily. - start monitoring fasting sugars>> which she continues to not complete. - she attended diabetes nutrition with her husband.  - she attended diabetes nutrition with her husband.  - Eye exam 03/01/2017--> encouraged to schedule.  - Foot exam completed 03/24/2018 - Prevnar administered 6/7//2017; PNA23 completed 05/28/2016 - flu shot encouraged yearly completed today - microalbumin UTD 03/24/2018 -A1c; 7.8> 9.3> 9.6> 10.8> 9.6> 9.3> 8.7 today  Depression anxiety/insomnia: -Stable.  -TSH collected today. -Continue Effexor 150 mg daily.  -Continue trazodone nightly.   -Referred to therapist last appointment. - Follow-up in 3-4 months  Hypertriglyceridemia/On statin therapy -Tolerating statin. -CBC, CMP and TSH collected today. Lipid panel up-to-date.  Vaginal irritation: New problem.   Patient to use Monistat 3-day treatment and at the end of treatment use Diflucan prescribed today. Urine cytology collected. If symptoms do not resolve will need to follow-up either here or with gynecologist.  Orders Placed This Encounter  Procedures  . Flu Vaccine QUAD 6+ mos PF IM (Fluarix Quad PF)  . CBC  . Basic Metabolic Panel (BMET)  . TSH  . POCT HgB A1C   Meds ordered this encounter  Medications  . venlafaxine XR (EFFEXOR-XR) 150 MG 24 hr capsule    Sig: Take 1 capsule (150 mg total) by mouth daily with breakfast. Needs appt prior to anymore refills    Dispense:  90 capsule    Refill:  1  . traZODone (DESYREL) 50 MG tablet    Sig: Take 0.5 tablets (25 mg total) by mouth at bedtime as needed for sleep. Start 1/2 tab QHS, increase by 1/2 tab every 3 nights if needed only. Max dose 2 tabs (100  mg)    Dispense:  90 tablet    Refill:  1  . sitaGLIPtin (JANUVIA) 100 MG tablet    Sig: Take 1 tablet (100 mg total) by mouth daily.    Dispense:  90 tablet     Refill:  1    Can she take in day? She is forgetting her dose bc she is taking at night.  [facility] glimepiride (AMARYL) 4 MG tablet    Sig: Take 2 tablets (8 mg total) by mouth daily with breakfast.    Dispense:  180 tablet    Refill:  1  . dapagliflozin propanediol (FARXIGA) 10 MG TABS tablet    Sig: Take 10 mg by mouth daily before breakfast.    Dispense:  90 tablet    Refill:  1  . atorvastatin (LIPITOR) 40 MG tablet    Sig: Take 1 tablet (40 mg total) by mouth daily.    Dispense:  90 tablet    Refill:  3  . fluconazole (DIFLUCAN) 150 MG tablet    Sig: Take 1 tablet (150 mg total) by mouth once for 1 dose.    Dispense:  1 tablet    Refill:  0     Reviewed expectations re: course of current medical issues.  Discussed self-management of symptoms.  Outlined signs and symptoms indicating need for more acute intervention.  Patient verbalized understanding and all questions were answered.  Patient received an After-Visit Summary.    electronically signed by:  Howard Pouch, DO  [place]

## 2019-02-03 LAB — CBC
HCT: 40.7 % (ref 35.0–45.0)
Hemoglobin: 13.9 g/dL (ref 11.7–15.5)
MCH: 28.8 pg (ref 27.0–33.0)
MCHC: 34.2 g/dL (ref 32.0–36.0)
MCV: 84.4 fL (ref 80.0–100.0)
MPV: 10.2 fL (ref 7.5–12.5)
Platelets: 166 10*3/uL (ref 140–400)
RBC: 4.82 10*6/uL (ref 3.80–5.10)
RDW: 15.7 % — ABNORMAL HIGH (ref 11.0–15.0)
WBC: 7.4 10*3/uL (ref 3.8–10.8)

## 2019-02-03 LAB — BASIC METABOLIC PANEL
BUN: 8 mg/dL (ref 7–25)
CO2: 29 mmol/L (ref 20–32)
Calcium: 9.5 mg/dL (ref 8.6–10.2)
Chloride: 100 mmol/L (ref 98–110)
Creat: 0.74 mg/dL (ref 0.50–1.10)
Glucose, Bld: 312 mg/dL — ABNORMAL HIGH (ref 65–99)
Potassium: 4.1 mmol/L (ref 3.5–5.3)
Sodium: 138 mmol/L (ref 135–146)

## 2019-02-03 LAB — TSH: TSH: 2.25 mIU/L

## 2019-02-05 ENCOUNTER — Encounter: Payer: Self-pay | Admitting: Family Medicine

## 2019-02-08 LAB — URINE CYTOLOGY ANCILLARY ONLY
Bacterial Vaginitis-Urine: NEGATIVE
Candida Urine: NEGATIVE

## 2019-03-26 ENCOUNTER — Encounter: Payer: Self-pay | Admitting: Family Medicine

## 2019-03-26 DIAGNOSIS — E119 Type 2 diabetes mellitus without complications: Secondary | ICD-10-CM

## 2019-03-26 MED ORDER — GLIMEPIRIDE 4 MG PO TABS: 8.0000 mg | ORAL_TABLET | Freq: Every day | ORAL | 0 refills | Status: DC

## 2019-03-29 LAB — HM DIABETES EYE EXAM

## 2019-05-30 ENCOUNTER — Encounter: Payer: Self-pay | Admitting: Family Medicine

## 2019-05-30 ENCOUNTER — Ambulatory Visit: Payer: Managed Care, Other (non HMO) | Admitting: Family Medicine

## 2019-05-30 ENCOUNTER — Other Ambulatory Visit: Payer: Self-pay

## 2019-05-30 VITALS — BP 118/85 | HR 80 | Temp 97.6°F | Resp 17 | Ht 66.0 in | Wt 234.2 lb

## 2019-05-30 DIAGNOSIS — F5105 Insomnia due to other mental disorder: Secondary | ICD-10-CM | POA: Diagnosis not present

## 2019-05-30 DIAGNOSIS — E119 Type 2 diabetes mellitus without complications: Secondary | ICD-10-CM

## 2019-05-30 DIAGNOSIS — F418 Other specified anxiety disorders: Secondary | ICD-10-CM | POA: Diagnosis not present

## 2019-05-30 DIAGNOSIS — R41840 Attention and concentration deficit: Secondary | ICD-10-CM

## 2019-05-30 DIAGNOSIS — F99 Mental disorder, not otherwise specified: Secondary | ICD-10-CM

## 2019-05-30 LAB — POCT GLYCOSYLATED HEMOGLOBIN (HGB A1C)
HbA1c POC (<> result, manual entry): 8.4 % (ref 4.0–5.6)
HbA1c, POC (prediabetic range): 8.4 % — AB (ref 5.7–6.4)
Hemoglobin A1C: 8.4 % — AB (ref 4.0–5.6)

## 2019-05-30 LAB — MICROALBUMIN / CREATININE URINE RATIO
Creatinine,U: 42.5 mg/dL
Microalb Creat Ratio: 1.6 mg/g (ref 0.0–30.0)
Microalb, Ur: <0.7 mg/dL (ref 0.0–1.9)

## 2019-05-30 MED ORDER — PEN NEEDLES 31G X 5 MM MISC: 1.0000 | Freq: Every day | 3 refills | Status: DC

## 2019-05-30 MED ORDER — TRAZODONE HCL 50 MG PO TABS: 25.0000 mg | ORAL_TABLET | Freq: Every evening | ORAL | 1 refills | Status: DC | PRN

## 2019-05-30 MED ORDER — FARXIGA 10 MG PO TABS: 10.0000 mg | ORAL_TABLET | Freq: Every day | ORAL | 1 refills | Status: DC

## 2019-05-30 MED ORDER — SITAGLIPTIN PHOSPHATE 100 MG PO TABS: 100.0000 mg | ORAL_TABLET | Freq: Every day | ORAL | 1 refills | Status: DC

## 2019-05-30 MED ORDER — LIRAGLUTIDE 18 MG/3ML ~~LOC~~ SOPN: PEN_INJECTOR | SUBCUTANEOUS | 1 refills | Status: DC

## 2019-05-30 MED ORDER — VENLAFAXINE HCL ER 75 MG PO CP24: 75.0000 mg | ORAL_CAPSULE | Freq: Every day | ORAL | 1 refills | Status: DC

## 2019-05-30 MED ORDER — GLIMEPIRIDE 4 MG PO TABS: 8.0000 mg | ORAL_TABLET | Freq: Every day | ORAL | 1 refills | Status: DC

## 2019-05-30 MED ORDER — VENLAFAXINE HCL ER 150 MG PO CP24: 150.0000 mg | ORAL_CAPSULE | Freq: Every day | ORAL | 1 refills | Status: DC

## 2019-05-30 NOTE — Patient Instructions (Addendum)
We are adding back the victoza> will need to taper.   I have referred you to attention specialist.  We increased your effexor dose> it will be 2 tabs (one 150 mg and one 75 mg tab) total dose 225 mg.   I am glad you are starting a gym/swimming.    Follow up 3 months.    Diabetes Mellitus and Nutrition, Adult When you have diabetes (diabetes mellitus), it is very important to have healthy eating habits because your blood sugar (glucose) levels are greatly affected by what you eat and drink. Eating healthy foods in the appropriate amounts, at about the same times every day, can help you:  Control your blood glucose.  Lower your risk of heart disease.  Improve your blood pressure.  Reach or maintain a healthy weight. Every person with diabetes is different, and each person has different needs for a meal plan. Your health care provider may recommend that you work with a diet and nutrition specialist (dietitian) to make a meal plan that is best for you. Your meal plan may vary depending on factors such as:  The calories you need.  The medicines you take.  Your weight.  Your blood glucose, blood pressure, and cholesterol levels.  Your activity level.  Other health conditions you have, such as heart or kidney disease. How do carbohydrates affect me? Carbohydrates, also called carbs, affect your blood glucose level more than any other type of food. Eating carbs naturally raises the amount of glucose in your blood. Carb counting is a method for keeping track of how many carbs you eat. Counting carbs is important to keep your blood glucose at a healthy level, especially if you use insulin or take certain oral diabetes medicines. It is important to know how many carbs you can safely have in each meal. This is different for every person. Your dietitian can help you calculate how many carbs you should have at each meal and for each snack. Foods that contain carbs include:  Bread, cereal,  rice, pasta, and crackers.  Potatoes and corn.  Peas, beans, and lentils.  Milk and yogurt.  Fruit and juice.  Desserts, such as cakes, cookies, ice cream, and candy. How does alcohol affect me? Alcohol can cause a sudden decrease in blood glucose (hypoglycemia), especially if you use insulin or take certain oral diabetes medicines. Hypoglycemia can be a life-threatening condition. Symptoms of hypoglycemia (sleepiness, dizziness, and confusion) are similar to symptoms of having too much alcohol. If your health care provider says that alcohol is safe for you, follow these guidelines:  Limit alcohol intake to no more than 1 drink per day for nonpregnant women and 2 drinks per day for men. One drink equals 12 oz of beer, 5 oz of wine, or 1 oz of hard liquor.  Do not drink on an empty stomach.  Keep yourself hydrated with water, diet soda, or unsweetened iced tea.  Keep in mind that regular soda, juice, and other mixers may contain a lot of sugar and must be counted as carbs. What are tips for following this plan?  Reading food labels  Start by checking the serving size on the "Nutrition Facts" label of packaged foods and drinks. The amount of calories, carbs, fats, and other nutrients listed on the label is based on one serving of the item. Many items contain more than one serving per package.  Check the total grams (g) of carbs in one serving. You can calculate the number of servings of  carbs in one serving by dividing the total carbs by 15. For example, if a food has 30 g of total carbs, it would be equal to 2 servings of carbs.  Check the number of grams (g) of saturated and trans fats in one serving. Choose foods that have low or no amount of these fats.  Check the number of milligrams (mg) of salt (sodium) in one serving. Most people should limit total sodium intake to less than 2,300 mg per day.  Always check the nutrition information of foods labeled as "low-fat" or "nonfat".  These foods may be higher in added sugar or refined carbs and should be avoided.  Talk to your dietitian to identify your daily goals for nutrients listed on the label. Shopping  Avoid buying canned, premade, or processed foods. These foods tend to be high in fat, sodium, and added sugar.  Shop around the outside edge of the grocery store. This includes fresh fruits and vegetables, bulk grains, fresh meats, and fresh dairy. Cooking  Use low-heat cooking methods, such as baking, instead of high-heat cooking methods like deep frying.  Cook using healthy oils, such as olive, canola, or sunflower oil.  Avoid cooking with butter, cream, or high-fat meats. Meal planning  Eat meals and snacks regularly, preferably at the same times every day. Avoid going long periods of time without eating.  Eat foods high in fiber, such as fresh fruits, vegetables, beans, and whole grains. Talk to your dietitian about how many servings of carbs you can eat at each meal.  Eat 4-6 ounces (oz) of lean protein each day, such as lean meat, chicken, fish, eggs, or tofu. One oz of lean protein is equal to: ? 1 oz of meat, chicken, or fish. ? 1 egg. ?  cup of tofu.  Eat some foods each day that contain healthy fats, such as avocado, nuts, seeds, and fish. Lifestyle  Check your blood glucose regularly.  Exercise regularly as told by your health care provider. This may include: ? 150 minutes of moderate-intensity or vigorous-intensity exercise each week. This could be brisk walking, biking, or water aerobics. ? Stretching and doing strength exercises, such as yoga or weightlifting, at least 2 times a week.  Take medicines as told by your health care provider.  Do not use any products that contain nicotine or tobacco, such as cigarettes and e-cigarettes. If you need help quitting, ask your health care provider.  Work with a Social worker or diabetes educator to identify strategies to manage stress and any  emotional and social challenges. Questions to ask a health care provider  Do I need to meet with a diabetes educator?  Do I need to meet with a dietitian?  What number can I call if I have questions?  When are the best times to check my blood glucose? Where to find more information:  American Diabetes Association: diabetes.org  Academy of Nutrition and Dietetics: www.eatright.[employer] of Diabetes and Digestive and Kidney Diseases (NIH): [employer] Summary  A healthy meal plan will help you control your blood glucose and maintain a healthy lifestyle.  Working with a diet and nutrition specialist (dietitian) can help you make a meal plan that is best for you.  Keep in mind that carbohydrates (carbs) and alcohol have immediate effects on your blood glucose levels. It is important to count carbs and to use alcohol carefully. This information is not intended to replace advice given to you by your health care provider. Make sure you  discuss any questions you have with your health care provider. Document Revised: 12/24/2016 Document Reviewed: 02/16/2016 Elsevier Patient Education  2020 [employer].

## 2019-05-30 NOTE — Progress Notes (Signed)
Melissa Gray , 08/24/1975, 44 y.o., female MRN: [id number] Patient Care Team    Relationship Specialty Notifications Start End  Ma Hillock, DO PCP - General Family Medicine  06/03/15   Lavena Bullion, DO Consulting Physician Gastroenterology  11/16/17   Ivan Anchors, [facility] Counselor Licensed Clinical Social Worker  06/14/18    Chief Complaint  Patient presents with  . Diabetes    Pt needs refills   . Anxiety  . Depression    Subjective: Melissa Gray is a 44 y.o.  Uncontrolled type 2 diabetes mellitus without complication, without long-term current use of insulin (HCC)/Morbid obesity ([place]) Patient reports she has been compliance with Farxiga 10 mg daily, januvia daily and amaryl 4 BID.  Patient denies dizziness, hyperglycemic or hypoglycemic events. Patient denies numbness, tingling in the extremities or nonhealing wounds of feet.  Does not monitor her blood glucose levels, her preference.  She does have supplies. - she attended diabetes nutrition with her husband.  - Eye exam 03/27/2019 - Foot exam completed 05/30/2019 - Prevnar administered 6/7//2017; PNA23 completed 05/28/2016 - flu shot encouraged yearly - microalbumin UTD collected today -A1c; 7.8> 9.3> 9.6> 10.8> 9.6> 9.3> 8.7> 8.4 today  Depression with anxiety/insomnia/attention deficit Patient reports she is doing okay but could still use an increased dose of the Effexor.  The trazodone she has not used often and prefers to try melatonin.  She states if the melatonin is not effective she may take 1/2-1 tab of the trazodone nightly.  She does express interest in having formal attention deficit disorder evaluation.  She feels she meets criteria.  And when speaking with her mother they feel she has met criteria ever since she was a child.  She would like to have evaluation and see if she received benefit from other medications to help with attention.  Hyperlipidemia: She reports compliance with lipitor.  Labs  up-to-date .    Depression screen [facility] 2/9 05/30/2019 02/02/2019 06/14/2018 12/05/2017 08/31/2017  Decreased Interest 2 1 1  0 0  Down, Depressed, Hopeless 2 1 1 1  0  PHQ - 2 Score 4 2 2 1  0  Altered sleeping 0 3 3 0 1  Tired, decreased energy 2 1 1 2 2   Change in appetite 1 1 2  0 0  Feeling bad or failure about yourself  2 1 2  0 0  Trouble concentrating 2 1 1  0 0  Moving slowly or fidgety/restless 0 0 0 0 0  Suicidal thoughts 0 0 0 0 0  PHQ-9 Score 11 9 11 3 3   Difficult doing work/chores Somewhat difficult Somewhat difficult Very difficult Not difficult at all Not difficult at all  Some recent data might be hidden   GAD 7 : Generalized Anxiety Score 05/30/2019 02/02/2019 06/14/2018 12/05/2017  Nervous, Anxious, on Edge 3 2 1  0  Control/stop worrying 3 2 1  0  Worry too much - different things 3 2 1  0  Trouble relaxing 2 2 2 1   Restless 2 2 2  0  Easily annoyed or irritable 2 2 2  0  Afraid - awful might happen 2 2 2  0  Total GAD 7 Score 17 14 11 1   Anxiety Difficulty Somewhat difficult Somewhat difficult Very difficult Not difficult at all     Allergies  Allergen Reactions  . Tape Other (See Comments)    Adhesive tape causes blisters  . Levothyroxine Rash    Generic brand only caused rash   Social History   Tobacco Use  .  Smoking status: Never Smoker  . Smokeless tobacco: Never Used  Substance Use Topics  . Alcohol use: Yes    Alcohol/week: 0.0 standard drinks    Comment: ocassionally   Past Medical History:  Diagnosis Date  . Anxiety   . COVID-19 07/2018  . Depression   . Diabetes mellitus without complication ([place])   . Elevated cholesterol   . Gallstones   . GERD (gastroesophageal reflux disease)   . Hematochezia 10/2017   Plan for colonoscopy  . Hyperplastic colon polyp 11/2017  . IBS (irritable bowel syndrome)    diarrhea predominant  . Infertility, female   . Obesity   . Thyroid disease 2004   treated for years, stopped 2014 per new MD   Past Surgical History:   Procedure Laterality Date  . CHOLECYSTECTOMY  2006  . CYST EXCISION    . GLOMUS TUMOR EXCISION  2005, 2012   finger and cheek   Family History  Problem Relation Age of Onset  . Diabetes Mother   . COPD Father   . Heart disease Father   . Early death Father   . Diabetes Brother   . Diabetes Maternal Grandmother   . Hearing loss Maternal Grandmother   . Dementia Maternal Grandmother   . Early death Maternal Grandfather   . Cancer Maternal Grandfather   . Breast cancer Paternal Grandmother   . Heart disease Paternal Grandfather   . Breast cancer Paternal Aunt   . Heart disease Paternal Uncle   . Heart attack Paternal Uncle   . Breast cancer Paternal Aunt   . Alcohol abuse Paternal Aunt   . Alzheimer's disease Paternal Aunt   . Colon cancer Neg Hx   . Esophageal cancer Neg Hx   . Rectal cancer Neg Hx   . Stomach cancer Neg Hx    Allergies as of 05/30/2019       Reactions   Tape Other (See Comments)   Adhesive tape causes blisters   Levothyroxine Rash   Generic brand only caused rash        Medication List        Accurate as of May 30, 2019  8:48 AM. If you have any questions, ask your nurse or doctor.          atorvastatin 40 MG tablet Commonly known as: LIPITOR Take 1 tablet (40 mg total) by mouth daily.   Farxiga 10 MG Tabs tablet Generic drug: dapagliflozin propanediol Take 10 mg by mouth daily before breakfast.   glimepiride 4 MG tablet Commonly known as: AMARYL Take 2 tablets (8 mg total) by mouth daily with breakfast.   liraglutide 18 MG/3ML Sopn Commonly known as: VICTOZA Inject 0.3 mLs (1.8 mg total) into the skin daily.   MULTIVITAMIN ADULT PO Take by mouth.   Pen Needles 31G X 5 MM Misc 1 Device by Does not apply route daily.   sitaGLIPtin 100 MG tablet Commonly known as: JANUVIA Take 1 tablet (100 mg total) by mouth daily.   traZODone 50 MG tablet Commonly known as: DESYREL Take 0.5 tablets (25 mg total) by mouth at bedtime as  needed for sleep. Start 1/2 tab QHS, increase by 1/2 tab every 3 nights if needed only. Max dose 2 tabs (100 mg)   venlafaxine XR 150 MG 24 hr capsule Commonly known as: EFFEXOR-XR Take 1 capsule (150 mg total) by mouth daily with breakfast. Needs appt prior to anymore refills        Results for orders placed or  performed in visit on 05/30/19 (from the past 24 hour(s))  POCT glycosylated hemoglobin (Hb A1C)     Status: Abnormal   Collection Time: 05/30/19  8:21 AM  Result Value Ref Range   Hemoglobin A1C 8.4 (A) 4.0 - 5.6 %   HbA1c POC (<> result, manual entry) 8.4 4.0 - 5.6 %   HbA1c, POC (prediabetic range) 8.4 (A) 5.7 - 6.4 %   No results found.   ROS: Negative, with the exception of above mentioned in HPI   Objective:  BP 118/85 (BP Location: Left Arm, Patient Position: Sitting, Cuff Size: Normal)   Pulse 80   Temp 97.6 F (36.4 C) (Temporal)   Resp 17   Ht 5' 6"  (1.676 m)   Wt 234 lb 4 oz (106.3 kg)   LMP 05/16/2019 (Approximate)   SpO2 95%   BMI 37.81 kg/m  Body mass index is 37.81 kg/m. Gen: Afebrile. No acute distress.  Nontoxic, obese, Caucasian female HENT: AT. [place]. Eyes:Pupils Equal Round Reactive to light, Extraocular movements intact,  Conjunctiva without redness, discharge or icterus. Neck/lymp/endocrine: Supple, no lymphadenopathy, no thyromegaly CV: RRR no murmur, no edema, +2/4 P posterior tibialis pulses Chest: CTAB, no wheeze or crackles Skin: no rashes, purpura or petechiae.  Neuro:  Normal gait. PERLA. EOMi. Alert x3  Psych: Tearful today.  Normal affect, dress and demeanor. Normal speech. Normal thought content and judgment..  Diabetic Foot Exam - Simple   Simple Foot Form Diabetic Foot exam was performed with the following findings: Yes 05/30/2019  8:48 AM  Visual Inspection No deformities, no ulcerations, no other skin breakdown bilaterally: Yes Sensation Testing Intact to touch and monofilament testing bilaterally: Yes Pulse Check Posterior  Tibialis and Dorsalis pulse intact bilaterally: Yes Comments      Results for orders placed or performed in visit on 05/30/19 (from the past 24 hour(s))  POCT glycosylated hemoglobin (Hb A1C)     Status: Abnormal   Collection Time: 05/30/19  8:21 AM  Result Value Ref Range   Hemoglobin A1C 8.4 (A) 4.0 - 5.6 %   HbA1c POC (<> result, manual entry) 8.4 4.0 - 5.6 %   HbA1c, POC (prediabetic range) 8.4 (A) 5.7 - 6.4 %    Assessment/Plan: Melissa Gray is a 44 y.o. female present for OV for  Uncontrolled type 2 diabetes mellitus without complication, without long-term current use of insulin (HCC) Morbid obesity ([place]) - a1c trending down.  - increase exercise.  - She does not want to start insulin.  - continue Januvia 100 mg daily.  - restart  Victoza tape to 1.2 mg.  -  continue Amaryl to 8 mg daily, may split dose if desired  -  continue  Farxiga to 10 mg daily. - start monitoring fasting sugars>> which she continues to not complete. - she attended diabetes nutrition with her husband.   - Eye exam  03/27/2019 - Foot exam completed 05/30/2019 - Prevnar administered 6/7//2017; PNA23 completed 05/28/2016 - flu shot encouraged yearly  - microalbumin UTD 03/24/2018 -A1c; 7.8> 9.3> 9.6> 10.8> 9.6> 9.3> 8.7>8.4  today  Depression anxiety/insomnia/focus deficit: -could use more coverage.  -Increase  Effexor 150 mg> 225 mg daily.  -continue  trazodone nightly PRN- mostly using melatonin.   -Referred to therapist  - referral to [place] attention specialist for focus concerns and evaluation.  - Follow-up in 3-4 months  Hypertriglyceridemia/On statin therapy -Tolerating statin. Labs due 01/2020    Orders Placed This Encounter  Procedures  . POCT glycosylated hemoglobin (  Hb A1C)   Meds ordered this encounter  Medications  . traZODone (DESYREL) 50 MG tablet    Sig: Take 0.5 tablets (25 mg total) by mouth at bedtime as needed for sleep. Start 1/2 tab QHS, increase by 1/2 tab every  3 nights if needed only. Max dose 2 tabs (100 mg)    Dispense:  45 tablet    Refill:  1  . sitaGLIPtin (JANUVIA) 100 MG tablet    Sig: Take 1 tablet (100 mg total) by mouth daily.    Dispense:  90 tablet    Refill:  1  . glimepiride (AMARYL) 4 MG tablet    Sig: Take 2 tablets (8 mg total) by mouth daily with breakfast.    Dispense:  180 tablet    Refill:  1  . dapagliflozin propanediol (FARXIGA) 10 MG TABS tablet    Sig: Take 10 mg by mouth daily before breakfast.    Dispense:  90 tablet    Refill:  1     Reviewed expectations re: course of current medical issues.  Discussed self-management of symptoms.  Outlined signs and symptoms indicating need for more acute intervention.  Patient verbalized understanding and all questions were answered.  Patient received an After-Visit Summary.     electronically signed by:  Howard Pouch, DO  [place]

## 2019-06-08 ENCOUNTER — Encounter: Payer: Self-pay | Admitting: Family Medicine

## 2019-08-27 ENCOUNTER — Encounter: Payer: Self-pay | Admitting: Family Medicine

## 2019-08-27 NOTE — Telephone Encounter (Signed)
Patient has additional follow up question regarding extended release tablet. Please advise, thanks.

## 2019-08-27 NOTE — Telephone Encounter (Signed)
Effexor does not come in 225 mg tab- 150+75 mg tabs are required for that dose.  There was 6 months worth of medications called in 05/30/2019. She should have another refill on both.

## 2019-08-27 NOTE — Telephone Encounter (Signed)
Extended is once a day dosing and other is every 12 hours.

## 2019-09-11 ENCOUNTER — Encounter: Payer: Self-pay | Admitting: Family Medicine

## 2019-09-11 ENCOUNTER — Ambulatory Visit (INDEPENDENT_AMBULATORY_CARE_PROVIDER_SITE_OTHER): Payer: Managed Care, Other (non HMO) | Admitting: Family Medicine

## 2019-09-11 ENCOUNTER — Other Ambulatory Visit: Payer: Self-pay

## 2019-09-11 VITALS — BP 121/78 | HR 72 | Temp 98.0°F | Resp 16 | Wt 229.2 lb

## 2019-09-11 DIAGNOSIS — F99 Mental disorder, not otherwise specified: Secondary | ICD-10-CM

## 2019-09-11 DIAGNOSIS — E786 Lipoprotein deficiency: Secondary | ICD-10-CM

## 2019-09-11 DIAGNOSIS — F5105 Insomnia due to other mental disorder: Secondary | ICD-10-CM

## 2019-09-11 DIAGNOSIS — E781 Pure hyperglyceridemia: Secondary | ICD-10-CM

## 2019-09-11 DIAGNOSIS — E119 Type 2 diabetes mellitus without complications: Secondary | ICD-10-CM

## 2019-09-11 DIAGNOSIS — F418 Other specified anxiety disorders: Secondary | ICD-10-CM

## 2019-09-11 LAB — POCT GLYCOSYLATED HEMOGLOBIN (HGB A1C)
HbA1c POC (<> result, manual entry): 8.7 % (ref 4.0–5.6)
HbA1c, POC (controlled diabetic range): 8.7 % — AB (ref 0.0–7.0)
HbA1c, POC (prediabetic range): 8.7 % — AB (ref 5.7–6.4)
Hemoglobin A1C: 8.7 % — AB (ref 4.0–5.6)

## 2019-09-11 MED ORDER — VENLAFAXINE HCL ER 75 MG PO CP24: 225.0000 mg | ORAL_CAPSULE | Freq: Every day | ORAL | 1 refills | Status: DC

## 2019-09-11 MED ORDER — DAPAGLIFLOZIN PROPANEDIOL 10 MG PO TABS: 10.0000 mg | ORAL_TABLET | Freq: Every day | ORAL | 1 refills | Status: DC

## 2019-09-11 MED ORDER — GLIMEPIRIDE 4 MG PO TABS: 8.0000 mg | ORAL_TABLET | Freq: Every day | ORAL | 1 refills | Status: DC

## 2019-09-11 MED ORDER — SITAGLIPTIN PHOSPHATE 100 MG PO TABS: 100.0000 mg | ORAL_TABLET | Freq: Every day | ORAL | 1 refills | Status: DC

## 2019-09-11 MED ORDER — ATORVASTATIN CALCIUM 40 MG PO TABS: 40.0000 mg | ORAL_TABLET | Freq: Every day | ORAL | 3 refills | Status: DC

## 2019-09-11 NOTE — Patient Instructions (Addendum)
a1c 8.7 today- I will look into the other pill format of the medication in the same class of as victoza. Continue victoza for  Now No changes to other meds.  effexor will come in three 75 mg tabs per day moving forward.     Attention Deficit Hyperactivity Disorder, Adult Attention deficit hyperactivity disorder (ADHD) is a mental health disorder that starts during childhood (neurodevelopmental disorder). For many people with ADHD, the disorder continues into the adult years. Treatment can help you manage your symptoms. What are the causes? The exact cause of ADHD is not known. Most experts believe genetics and environmental factors contribute to ADHD. What increases the risk? The following factors may make you more likely to develop this condition:  Having a family history of ADHD.  Being female.  Being born to a mother who smoked or drank alcohol during pregnancy.  Being exposed to lead or other toxins in the womb or early in life.  Being born before 48 weeks of pregnancy (prematurely) or at a low birth weight.  Having experienced a brain injury. What are the signs or symptoms? Symptoms of this condition depend on the type of ADHD. The two main types are inattentive and hyperactive-impulsive. Some people may have symptoms of both types. Symptoms of the inattentive type include:  Difficulty paying attention.  Making careless mistakes.  Not following instructions.  Being disorganized.  Avoiding tasks that require time and attention.  Losing and forgetting things.  Being easily distracted. Symptoms of the hyperactive-impulsive type include:  Restlessness.  Talking too much.  Interrupting.  Difficulty with: ? Sitting still. ? Feeling motivated. ? Relaxing. ? Waiting in line or waiting for a turn. In adults, this condition may lead to certain problems, such as:  Keeping jobs.  Performing tasks at work.  Having stable relationships.  Being on time or keeping to  a schedule. How is this diagnosed? This condition is diagnosed based on your current symptoms and your history of symptoms. The diagnosis can be made by a health care provider such as a primary care provider or a mental health care specialist. Your health care provider may use a symptom checklist or a behavior rating scale to evaluate your symptoms. He or she may also want to talk with people who have observed your behaviors throughout your life. How is this treated? This condition can be treated with medicines and behavior therapy. Medicines may be the best option to reduce impulsive behaviors and improve attention. Your health care provider may recommend:  Stimulant medicines. These are the most common medicines used for adult ADHD. They affect certain chemicals in the brain (neurotransmitters) and improve your ability to control your symptoms.  A non-stimulant medicine for adult ADHD (atomoxetine). This medicine increases a neurotransmitter called norepinephrine. It may take weeks to months to see effects from this medicine. Counseling and behavioral management are also important for treating ADHD. Counseling is often used along with medicine. Your health care provider may suggest:  Cognitive behavioral therapy (CBT). This type of therapy teaches you to replace negative thoughts and actions with positive thoughts and actions. When used as part of ADHD treatment, this therapy may also include: ? Coping strategies for organization, time management, impulse control, and stress reduction. ? Mindfulness and meditation training.  Behavioral management. You may work with a [facility] manager who is specially trained to help people with ADHD manage and organize activities and function more effectively. Follow these instructions at home: Medicines   Take over-the-counter and prescription medicines  only as told by your health care provider.  Talk with your health care provider about the possible side effects of  your medicines and how to manage them. Lifestyle   Do not use drugs.  Do not drink alcohol if: ? Your health care provider tells you not to drink. ? You are pregnant, may be pregnant, or are planning to become pregnant.  If you drink alcohol: ? Limit how much you use to:  0-1 drink a day for women.  0-2 drinks a day for men. ? Be aware of how much alcohol is in your drink. In the [place] one drink equals one 12 oz bottle of beer (355 mL), one 5 oz glass of wine (148 mL), or one 1 oz glass of hard liquor (44 mL).  Get enough sleep.  Eat a healthy diet.  Exercise regularly. Exercise can help to reduce stress and anxiety. General instructions  Learn as much as you can about adult ADHD, and work closely with your health care providers to find the treatments that work best for you.  Follow the same schedule each day.  Use reminder devices like notes, calendars, and phone apps to stay on time and organized.  Keep all follow-up visits as told by your health care provider and therapist. This is important. Where to find more information A health care provider may be able to recommend resources that are available online or over the phone. You could start with:  Attention Deficit Disorder Association (ADDA): PubAddiction.co.nz  National Institute of Mental Health [facility]): https://carter.com/ Contact a health care provider if:  Your symptoms continue to cause problems.  You have side effects from your medicine, such as: ? Repeated muscle twitches, coughing, or speech outbursts. ? Sleep problems. ? Loss of appetite. ? Dizziness. ? Unusually fast heartbeat. ? Stomach pains. ? Headaches.  You are struggling with anxiety, depression, or substance abuse. Get help right away if you:  Have a severe reaction to a medicine. If you ever feel like you may hurt yourself or others, or have thoughts about taking your own life, get help right away. You can go to the nearest emergency department or  call:  Your local emergency services (911 in the [place]  A suicide crisis helpline, such as the [place] at 937-579-0935. This is open 24 hours a day. Summary  ADHD is a mental health disorder that starts during childhood (neurodevelopmental disorder) and often continues into the adult years.  The exact cause of ADHD is not known. Most experts believe genetics and environmental factors contribute to ADHD.  There is no cure for ADHD, but treatment with medicine, cognitive behavioral therapy, or behavioral management can help you manage your condition. This information is not intended to replace advice given to you by your health care provider. Make sure you discuss any questions you have with your health care provider. Document Revised: 06/05/2018 Document Reviewed: 06/05/2018 Elsevier Patient Education  [place].

## 2019-09-11 NOTE — Progress Notes (Signed)
Melissa Gray , Nov 14, 1975, 44 y.o., female MRN: [id number] Patient Care Team    Relationship Specialty Notifications Start End  Ma Hillock, DO PCP - General Family Medicine  06/03/15   Lavena Bullion, DO Consulting Physician Gastroenterology  11/16/17    Chief Complaint  Patient presents with  . Follow-up    [facility]    Subjective: Melissa Gray is a 44 y.o.  Uncontrolled type 2 diabetes mellitus without complication, without long-term current use of insulin (HCC)/Morbid obesity ([place]) Patient reports she has been compliance with Farxiga 10 mg daily, januvia daily and amaryl 4 BID.  She still is not using the Victoza secondary to injection format. Patient denies dizziness, hyperglycemic or hypoglycemic events. Patient denies numbness, tingling in the extremities or nonhealing wounds of feet.  Does not monitor her blood glucose levels, her preference.  She does have supplies. - she attended diabetes nutrition with her husband.  - Eye exam 03/27/2019 - Foot exam completed 05/30/2019 - Prevnar administered 6/7//2017; PNA23 completed 05/28/2016 - flu shot encouraged yearly - microalbumin UTD  -A1c; 7.8> 9.3> 9.6> 10.8> 9.6> 9.3> 8.7> 8.4>8.7 today  Depression with anxiety/insomnia/attention deficit Patient reports she is doing okay but could still use an increased dose of the Effexor.  The trazodone she has not used often and prefers to try melatonin.  She states if the melatonin is not effective she may take 1/2-1 tab of the trazodone nightly.  Patient reports she has been seeing a therapist.  Her therapist feels like she can meet criteria for ADHD.  Patient had mention prior she had concerns over her ADHD and her mother also thought as a child she had symptoms of ADHD.  She had been referred to attention specialist and had never established.  Hyperlipidemia: She reports compliance with lipitor.  Labs due next visit. .    Depression screen [facility] 2/9 05/30/2019 02/02/2019 06/14/2018  12/05/2017 08/31/2017  Decreased Interest 2 1 1  0 0  Down, Depressed, Hopeless 2 1 1 1  0  PHQ - 2 Score 4 2 2 1  0  Altered sleeping 0 3 3 0 1  Tired, decreased energy 2 1 1 2 2   Change in appetite 1 1 2  0 0  Feeling bad or failure about yourself  2 1 2  0 0  Trouble concentrating 2 1 1  0 0  Moving slowly or fidgety/restless 0 0 0 0 0  Suicidal thoughts 0 0 0 0 0  PHQ-9 Score 11 9 11 3 3   Difficult doing work/chores Somewhat difficult Somewhat difficult Very difficult Not difficult at all Not difficult at all  Some recent data might be hidden   GAD 7 : Generalized Anxiety Score 05/30/2019 02/02/2019 06/14/2018 12/05/2017  Nervous, Anxious, on Edge 3 2 1  0  Control/stop worrying 3 2 1  0  Worry too much - different things 3 2 1  0  Trouble relaxing 2 2 2 1   Restless 2 2 2  0  Easily annoyed or irritable 2 2 2  0  Afraid - awful might happen 2 2 2  0  Total GAD 7 Score 17 14 11 1   Anxiety Difficulty Somewhat difficult Somewhat difficult Very difficult Not difficult at all     Allergies  Allergen Reactions  . Tape Other (See Comments)    Adhesive tape causes blisters  . Levothyroxine Rash    Generic brand only caused rash   Social History   Tobacco Use  . Smoking status: Never Smoker  . Smokeless tobacco: Never Used  Substance Use Topics  . Alcohol use: Yes    Alcohol/week: 0.0 standard drinks    Comment: ocassionally   Past Medical History:  Diagnosis Date  . Anxiety   . COVID-19 07/2018  . Depression   . Diabetes mellitus without complication ([place])   . Elevated cholesterol   . Gallstones   . GERD (gastroesophageal reflux disease)   . Hematochezia 10/2017   Plan for colonoscopy  . Hyperplastic colon polyp 11/2017  . IBS (irritable bowel syndrome)    diarrhea predominant  . Infertility, female   . Obesity   . Thyroid disease 2004   treated for years, stopped 2014 per new MD   Past Surgical History:  Procedure Laterality Date  . CHOLECYSTECTOMY  2006  . CYST EXCISION     . GLOMUS TUMOR EXCISION  2005, 2012   finger and cheek   Family History  Problem Relation Age of Onset  . Diabetes Mother   . COPD Father   . Heart disease Father   . Early death Father   . Diabetes Brother   . Diabetes Maternal Grandmother   . Hearing loss Maternal Grandmother   . Dementia Maternal Grandmother   . Early death Maternal Grandfather   . Cancer Maternal Grandfather   . Breast cancer Paternal Grandmother   . Heart disease Paternal Grandfather   . Breast cancer Paternal Aunt   . Heart disease Paternal Uncle   . Heart attack Paternal Uncle   . Breast cancer Paternal Aunt   . Alcohol abuse Paternal Aunt   . Alzheimer's disease Paternal Aunt   . Colon cancer Neg Hx   . Esophageal cancer Neg Hx   . Rectal cancer Neg Hx   . Stomach cancer Neg Hx    Allergies as of 09/11/2019      Reactions   Tape Other (See Comments)   Adhesive tape causes blisters   Levothyroxine Rash   Generic brand only caused rash      Medication List       Accurate as of September 11, 2019 11:59 PM. If you have any questions, ask your nurse or doctor.        atorvastatin 40 MG tablet Commonly known as: LIPITOR Take 1 tablet (40 mg total) by mouth daily.   dapagliflozin propanediol 10 MG Tabs tablet Commonly known as: Farxiga Take 1 tablet (10 mg total) by mouth daily before breakfast.   glimepiride 4 MG tablet Commonly known as: AMARYL Take 2 tablets (8 mg total) by mouth daily with breakfast.   liraglutide 18 MG/3ML Sopn Commonly known as: VICTOZA 0.6 mg QD x7days, then 1.2 mg QD   MULTIVITAMIN ADULT PO Take by mouth.   Pen Needles 31G X 5 MM Misc 1 Device by Does not apply route daily.   NovoFine 32G X 6 MM Misc Generic drug: Insulin Pen Needle   Rybelsus 3 MG Tabs Generic drug: Semaglutide 1 tab (3 mg) daily approximately 30 minutes before first meal of the day for 30 days. Started by: Howard Pouch, DO   Rybelsus 7 MG Tabs Generic drug: Semaglutide 1 tab daily  30 minutes before first meal of the day. Started by: Howard Pouch, DO   sitaGLIPtin 100 MG tablet Commonly known as: JANUVIA Take 1 tablet (100 mg total) by mouth daily.   traZODone 50 MG tablet Commonly known as: DESYREL Take 0.5 tablets (25 mg total) by mouth at bedtime as needed for sleep. Start 1/2 tab QHS, increase by 1/2 tab every  3 nights if needed only. Max dose 2 tabs (100 mg)   venlafaxine XR 75 MG 24 hr capsule Commonly known as: Effexor XR Take 3 capsules (225 mg total) by mouth daily with breakfast. What changed:   how much to take  Another medication with the same name was removed. Continue taking this medication, and follow the directions you see here. Changed by: Howard Pouch, DO       No results found for this or any previous visit (from the past 24 hour(s)). No results found.   ROS: Negative, with the exception of above mentioned in HPI   Objective:  BP 121/78 (BP Location: Left Arm, Patient Position: Sitting, Cuff Size: Large)   Pulse 72   Temp 98 F (36.7 C) (Oral)   Resp 16   Wt 229 lb 3.2 oz (104 kg)   LMP 09/03/2019   SpO2 98%   BMI 36.99 kg/m  Body mass index is 36.99 kg/m. Gen: Afebrile. No acute distress.  HENT: AT. [place].  Eyes:Pupils Equal Round Reactive to light, Extraocular movements intact,  Conjunctiva without redness, discharge or icterus. Neck/lymp/endocrine: Supple,no lymphadenopathy, no thyromegaly CV: RRR no murmur Chest: CTAB, no wheeze or crackles Abd: Soft. NTND. BS present Skin: no rashes, purpura or petechiae.  Neuro:  Normal gait. PERLA. EOMi. Alert. Oriented x3 Psych: Normal affect, dress and demeanor. Normal speech. Normal thought content and judgment.    No results found for this or any previous visit (from the past 24 hour(s)).  Assessment/Plan: Faiza Bansal is a 44 y.o. female present for OV for  Uncontrolled type 2 diabetes mellitus without complication, without long-term current use of insulin ([place]) Morbid  obesity ([place]) -A1c have started to rise again.  She is intolerant to Metformin.  She is not taking the Victoza secondary to not liking injections.   - increase exercise.  - She does not want to start insulin or use injectable -Continue Januvia 100 mg daily.  -Discontinue Victoza since she will not use it.  Start Rybelsus 3 mg for 30 days and then increase to Rybelsus 7 mg daily.  If this medication is not covered the only options are injectables-patient is aware.  Patient may benefit from endocrine referral if unable to better control A1c. -Continue Amaryl to 8 mg daily, may split dose if desired  -Continue Farxiga  10 mg daily. - start monitoring fasting sugars>> which she continues to not complete. - she attended diabetes nutrition with her husband.   - Eye exam  03/27/2019 - Foot exam completed 05/30/2019 - Prevnar administered 6/7//2017; PNA23 completed 05/28/2016 - flu shot encouraged yearly  - microalbumin UTD 05/30/2019 -A1c; 7.8> 9.3> 9.6> 10.8> 9.6> 9.3> 8.7>8.4> 8.7  today  Depression anxiety/insomnia/focus deficit: -Stable -continue  Effexor  225 mg daily (three 75 mg tabs)  -continue  trazodone nightly PRN- mostly using melatonin.   -Referred to therapist   Encourage patient to make appointment for full evaluation of ADHD and discussion of potential medications if felt appropriate - Follow-up in 3-4 months  Hypertriglyceridemia/On statin therapy -Tolerating statin. Labs due 01/2020    Orders Placed This Encounter  Procedures  . POCT glycosylated hemoglobin (Hb A1C)   Meds ordered this encounter  Medications  . venlafaxine XR (EFFEXOR XR) 75 MG 24 hr capsule    Sig: Take 3 capsules (225 mg total) by mouth daily with breakfast.    Dispense:  270 capsule    Refill:  1    DC effexor 150  . sitaGLIPtin (  JANUVIA) 100 MG tablet    Sig: Take 1 tablet (100 mg total) by mouth daily.    Dispense:  90 tablet    Refill:  1  . glimepiride (AMARYL) 4 MG tablet    Sig: Take 2  tablets (8 mg total) by mouth daily with breakfast.    Dispense:  180 tablet    Refill:  1  . dapagliflozin propanediol (FARXIGA) 10 MG TABS tablet    Sig: Take 1 tablet (10 mg total) by mouth daily before breakfast.    Dispense:  90 tablet    Refill:  1  . atorvastatin (LIPITOR) 40 MG tablet    Sig: Take 1 tablet (40 mg total) by mouth daily.    Dispense:  90 tablet    Refill:  3  . Semaglutide (RYBELSUS) 3 MG TABS    Sig: 1 tab (3 mg) daily approximately 30 minutes before first meal of the day for 30 days.    Dispense:  30 tablet    Refill:  0    Please explained to patient the 2 prescriptions.  First prescription lower dose for 30 days and then second prescription is due she will continue until her next appointment.  . Semaglutide (RYBELSUS) 7 MG TABS    Sig: 1 tab daily 30 minutes before first meal of the day.    Dispense:  90 tablet    Refill:  0    May fill after 10/10/2019.  Patient must first complete 3 mg tabs for 30 days prescription which was also provided 09/12/2019     Reviewed expectations re: course of current medical issues.  Discussed self-management of symptoms.  Outlined signs and symptoms indicating need for more acute intervention.  Patient verbalized understanding and all questions were answered.  Patient received an After-Visit Summary.    electronically signed by:  Howard Pouch, DO  [place]

## 2019-09-12 MED ORDER — RYBELSUS 7 MG PO TABS: ORAL_TABLET | ORAL | 0 refills | Status: DC

## 2019-09-12 MED ORDER — RYBELSUS 3 MG PO TABS: ORAL_TABLET | ORAL | 0 refills | Status: DC

## 2019-09-27 ENCOUNTER — Other Ambulatory Visit: Payer: Self-pay

## 2019-09-27 ENCOUNTER — Encounter: Payer: Self-pay | Admitting: Family Medicine

## 2019-09-27 ENCOUNTER — Ambulatory Visit (INDEPENDENT_AMBULATORY_CARE_PROVIDER_SITE_OTHER): Payer: Managed Care, Other (non HMO) | Admitting: Family Medicine

## 2019-09-27 VITALS — BP 124/88 | HR 104 | Temp 98.2°F | Resp 16 | Ht 66.0 in | Wt 227.1 lb

## 2019-09-27 DIAGNOSIS — Z23 Encounter for immunization: Secondary | ICD-10-CM

## 2019-09-27 DIAGNOSIS — R41840 Attention and concentration deficit: Secondary | ICD-10-CM | POA: Diagnosis not present

## 2019-09-27 MED ORDER — AMPHETAMINE-DEXTROAMPHETAMINE 10 MG PO TABS: ORAL_TABLET | ORAL | 0 refills | Status: DC

## 2019-09-27 NOTE — Progress Notes (Signed)
Pre visit review using our clinic review tool, if applicable. No additional management support is needed unless otherwise documented below in the visit note. 

## 2019-09-27 NOTE — Progress Notes (Signed)
This visit occurred during the SARS-CoV-2 public health emergency.  Safety protocols were in place, including screening questions prior to the visit, additional usage of staff PPE, and extensive cleaning of exam room while observing appropriate contact time as indicated for disinfecting solutions.    Melissa Gray , 1975/06/11, 44 y.o., female MRN: [id number] Patient Care Team    Relationship Specialty Notifications Start End  Ma Hillock, DO PCP - General Family Medicine  06/03/15   Lavena Bullion, DO Consulting Physician Gastroenterology  11/16/17     Chief Complaint  Patient presents with  . ADHD    discuss meds     Subjective: Pt presents for an OV to discuss her attention and concentration difficulty. She reports onset as young as first grade. Unfortunately she was never diagnosed at that time. She recently was seen by a psychologist who completed an ADHD evaluation on her and it was positive. She would like to discuss medication start today. ADHD screening tool completed today and positive.  Depression screen [facility] 2/9 09/27/2019 05/30/2019 02/02/2019 06/14/2018 12/05/2017  Decreased Interest 1 2 1 1  0  Down, Depressed, Hopeless 1 2 1 1 1   PHQ - 2 Score 2 4 2 2 1   Altered sleeping 2 0 3 3 0  Tired, decreased energy 1 2 1 1 2   Change in appetite 1 1 1 2  0  Feeling bad or failure about yourself  2 2 1 2  0  Trouble concentrating 2 2 1 1  0  Moving slowly or fidgety/restless 1 0 0 0 0  Suicidal thoughts 0 0 0 0 0  PHQ-9 Score 11 11 9 11 3   Difficult doing work/chores Very difficult Somewhat difficult Somewhat difficult Very difficult Not difficult at all  Some recent data might be hidden    Allergies  Allergen Reactions  . Tape Other (See Comments)    Adhesive tape causes blisters  . Levothyroxine Rash    Generic brand only caused rash   Social History   Social History Narrative   Married. Husband's name is Audelia Acton. No children.   2 pets.   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   Drinks caffeine   Wears her seatbelt   Smoke detector in home   Safe in her relationship.   Past Medical History:  Diagnosis Date  . Anxiety   . COVID-19 07/2018  . Depression   . Diabetes mellitus without complication ([place])   . Elevated cholesterol   . Gallstones   . GERD (gastroesophageal reflux disease)   . Hematochezia 10/2017   Plan for colonoscopy  . Hyperplastic colon polyp 11/2017  . IBS (irritable bowel syndrome)    diarrhea predominant  . Infertility, female   . Obesity   . Thyroid disease 2004   treated for years, stopped 2014 per new MD   Past Surgical History:  Procedure Laterality Date  . CHOLECYSTECTOMY  2006  . CYST EXCISION    . GLOMUS TUMOR EXCISION  2005, 2012   finger and cheek   Family History  Problem Relation Age of Onset  . Diabetes Mother   . COPD Father   . Heart disease Father   . Early death Father   . Diabetes Brother   . Diabetes Maternal Grandmother   . Hearing loss Maternal Grandmother   . Dementia Maternal Grandmother   . Early death Maternal Grandfather   . Cancer Maternal Grandfather   . Breast cancer Paternal Grandmother   . Heart  disease Paternal Grandfather   . Breast cancer Paternal Aunt   . Heart disease Paternal Uncle   . Heart attack Paternal Uncle   . Breast cancer Paternal Aunt   . Alcohol abuse Paternal Aunt   . Alzheimer's disease Paternal Aunt   . Colon cancer Neg Hx   . Esophageal cancer Neg Hx   . Rectal cancer Neg Hx   . Stomach cancer Neg Hx    Allergies as of 09/27/2019      Reactions   Tape Other (See Comments)   Adhesive tape causes blisters   Levothyroxine Rash   Generic brand only caused rash      Medication List       Accurate as of September 27, 2019  8:24 AM. If you have any questions, ask your nurse or doctor.        atorvastatin 40 MG tablet Commonly known as: LIPITOR Take 1 tablet (40 mg total) by mouth daily.   dapagliflozin propanediol 10 MG  Tabs tablet Commonly known as: Farxiga Take 1 tablet (10 mg total) by mouth daily before breakfast.   glimepiride 4 MG tablet Commonly known as: AMARYL Take 2 tablets (8 mg total) by mouth daily with breakfast.   MULTIVITAMIN ADULT PO Take by mouth.   Pen Needles 31G X 5 MM Misc 1 Device by Does not apply route daily. What changed: Another medication with the same name was removed. Continue taking this medication, and follow the directions you see here. Changed by: Howard Pouch, DO   Rybelsus 3 MG Tabs Generic drug: Semaglutide 1 tab (3 mg) daily approximately 30 minutes before first meal of the day for 30 days.   Rybelsus 7 MG Tabs Generic drug: Semaglutide 1 tab daily 30 minutes before first meal of the day.   sitaGLIPtin 100 MG tablet Commonly known as: JANUVIA Take 1 tablet (100 mg total) by mouth daily.   traZODone 50 MG tablet Commonly known as: DESYREL Take 0.5 tablets (25 mg total) by mouth at bedtime as needed for sleep. Start 1/2 tab QHS, increase by 1/2 tab every 3 nights if needed only. Max dose 2 tabs (100 mg)   venlafaxine XR 75 MG 24 hr capsule Commonly known as: Effexor XR Take 3 capsules (225 mg total) by mouth daily with breakfast.       All past medical history, surgical history, allergies, family history, immunizations andmedications were updated in the EMR today and reviewed under the history and medication portions of their EMR.     ROS: Negative, with the exception of above mentioned in HPI   Objective:  BP 124/88 (BP Location: Left Arm, Patient Position: Sitting, Cuff Size: Normal)   Pulse (!) 104   Temp 98.2 F (36.8 C) (Oral)   Resp 16   Ht 5\' 6"  (1.676 m)   Wt 227 lb 2 oz (103 kg)   LMP 09/03/2019   SpO2 96%   BMI 36.66 kg/m  Body mass index is 36.66 kg/m. Gen: Afebrile. No acute distress. Nontoxic in appearance, well developed, well nourished.  HENT: AT. [place].  Eyes:Pupils Equal Round Reactive to light, Extraocular movements  intact,  Conjunctiva without redness, discharge or icterus. CV: RRR No murmur, no edema Neuro: Normal gait. PERLA. EOMi. Alert. Oriented x3  Psych: Normal affect, dress and demeanor. Normal speech. Normal thought content and judgment.  No exam data present No results found. No results found for this or any previous visit (from the past 24 hour(s)).  Assessment/Plan: Bonnell Public  is a 44 y.o. female present for OV for  Attention and concentration deficit Discussed different options with her today on treatment plans. With her history of insomnia elected to try short acting Adderall 5-10 mg twice daily as needed. Tapering instructions were discussed with her today. She will start with Adderall 5 mg daily in the morning. If needing higher dose may increase to Adderall 10 mg in the morning. After a week if she feels like she needs an afternoon dose she can take 5 mg in the afternoon as well. Encouraged her to make sure she takes prior to 1 PM in order to not worsening her insomnia. Patient reports understanding of plan. [place] controlled substance database was reviewed today. On follow-up appointment UDS will be collected and controlled substance contract signed. Follow-up with other chronic conditions-she has appointment already scheduled.  Need for immunization against influenza administered today   Reviewed expectations re: course of current medical issues.  Discussed self-management of symptoms.  Outlined signs and symptoms indicating need for more acute intervention.  Patient verbalized understanding and all questions were answered.  Patient received an After-Visit Summary.    No orders of the defined types were placed in this encounter.  No orders of the defined types were placed in this encounter.  Referral Orders  No referral(s) requested today     Note is dictated utilizing voice recognition software. Although note has been proof read prior to signing,  occasional typographical errors still can be missed. If any questions arise, please do not hesitate to call for verification.   electronically signed by:  Howard Pouch, DO  [place]

## 2019-09-27 NOTE — Patient Instructions (Signed)
Start 1/2 tab of adderall in the morning  for 3 days then can increase to 1 tab in the morning just before you leave for work.   We will follow up at your diabetes appt.

## 2019-11-29 ENCOUNTER — Other Ambulatory Visit: Payer: Self-pay | Admitting: Family Medicine

## 2019-12-12 ENCOUNTER — Ambulatory Visit: Payer: Managed Care, Other (non HMO) | Admitting: Family Medicine

## 2019-12-12 ENCOUNTER — Encounter: Payer: Self-pay | Admitting: Family Medicine

## 2019-12-12 ENCOUNTER — Other Ambulatory Visit: Payer: Self-pay

## 2019-12-12 ENCOUNTER — Telehealth: Payer: Self-pay | Admitting: Family Medicine

## 2019-12-12 VITALS — BP 102/71 | HR 89 | Temp 98.1°F | Ht 66.0 in | Wt 232.0 lb

## 2019-12-12 DIAGNOSIS — E119 Type 2 diabetes mellitus without complications: Secondary | ICD-10-CM | POA: Diagnosis not present

## 2019-12-12 DIAGNOSIS — E781 Pure hyperglyceridemia: Secondary | ICD-10-CM | POA: Diagnosis not present

## 2019-12-12 DIAGNOSIS — F99 Mental disorder, not otherwise specified: Secondary | ICD-10-CM

## 2019-12-12 DIAGNOSIS — E786 Lipoprotein deficiency: Secondary | ICD-10-CM

## 2019-12-12 DIAGNOSIS — F418 Other specified anxiety disorders: Secondary | ICD-10-CM

## 2019-12-12 DIAGNOSIS — R41840 Attention and concentration deficit: Secondary | ICD-10-CM

## 2019-12-12 DIAGNOSIS — F5105 Insomnia due to other mental disorder: Secondary | ICD-10-CM

## 2019-12-12 LAB — CBC
HCT: 41.1 % (ref 36.0–46.0)
Hemoglobin: 13.8 g/dL (ref 12.0–15.0)
MCHC: 33.5 g/dL (ref 30.0–36.0)
MCV: 85.2 fl (ref 78.0–100.0)
Platelets: 187 10*3/uL (ref 150.0–400.0)
RBC: 4.83 Mil/uL (ref 3.87–5.11)
RDW: 17.3 % — ABNORMAL HIGH (ref 11.5–15.5)
WBC: 8.2 10*3/uL (ref 4.0–10.5)

## 2019-12-12 LAB — COMPREHENSIVE METABOLIC PANEL
ALT: 37 U/L — ABNORMAL HIGH (ref 0–35)
AST: 23 U/L (ref 0–37)
Albumin: 4.2 g/dL (ref 3.5–5.2)
Alkaline Phosphatase: 111 U/L (ref 39–117)
BUN: 12 mg/dL (ref 6–23)
CO2: 30 mEq/L (ref 19–32)
Calcium: 9.5 mg/dL (ref 8.4–10.5)
Chloride: 98 mEq/L (ref 96–112)
Creatinine, Ser: 0.65 mg/dL (ref 0.40–1.20)
GFR: 108.86 mL/min (ref >60.00)
Glucose, Bld: 293 mg/dL — ABNORMAL HIGH (ref 70–99)
Potassium: 3.8 mEq/L (ref 3.5–5.1)
Sodium: 136 mEq/L (ref 135–145)
Total Bilirubin: 1.1 mg/dL (ref 0.2–1.2)
Total Protein: 6.5 g/dL (ref 6.0–8.3)

## 2019-12-12 LAB — LIPID PANEL
Cholesterol: 123 mg/dL (ref 0–200)
HDL: 25.7 mg/dL — ABNORMAL LOW (ref >39.00)
Total CHOL/HDL Ratio: 5
Triglycerides: 600 mg/dL — ABNORMAL HIGH (ref 0.0–149.0)

## 2019-12-12 LAB — TSH: TSH: 4.95 u[IU]/mL — ABNORMAL HIGH (ref 0.35–4.50)

## 2019-12-12 LAB — POCT GLYCOSYLATED HEMOGLOBIN (HGB A1C)
HbA1c POC (<> result, manual entry): 8.7 % (ref 4.0–5.6)
HbA1c, POC (controlled diabetic range): 8.7 % — AB (ref 0.0–7.0)
HbA1c, POC (prediabetic range): 8.7 % — AB (ref 5.7–6.4)
Hemoglobin A1C: 8.7 % — AB (ref 4.0–5.6)

## 2019-12-12 LAB — LDL CHOLESTEROL, DIRECT: Direct LDL: 51 mg/dL

## 2019-12-12 MED ORDER — DAPAGLIFLOZIN PROPANEDIOL 10 MG PO TABS
10.0000 mg | ORAL_TABLET | Freq: Every day | ORAL | 1 refills | Status: DC
Start: 2019-12-12 — End: 2020-03-12

## 2019-12-12 MED ORDER — AMPHETAMINE-DEXTROAMPHETAMINE 10 MG PO TABS
5.0000 mg | ORAL_TABLET | Freq: Two times a day (BID) | ORAL | 0 refills | Status: DC
Start: 2019-12-12 — End: 2019-12-12

## 2019-12-12 MED ORDER — GLIMEPIRIDE 4 MG PO TABS: 8.0000 mg | ORAL_TABLET | Freq: Every day | ORAL | 1 refills | Status: DC

## 2019-12-12 MED ORDER — ATORVASTATIN CALCIUM 40 MG PO TABS
40.0000 mg | ORAL_TABLET | Freq: Every day | ORAL | 3 refills | Status: DC
Start: 2019-12-12 — End: 2020-10-28

## 2019-12-12 MED ORDER — AMPHETAMINE-DEXTROAMPHETAMINE 10 MG PO TABS
ORAL_TABLET | ORAL | 0 refills | Status: DC
Start: 2019-12-12 — End: 2020-03-12

## 2019-12-12 MED ORDER — VENLAFAXINE HCL ER 75 MG PO CP24
225.0000 mg | ORAL_CAPSULE | Freq: Every day | ORAL | 1 refills | Status: DC
Start: 2019-12-12 — End: 2020-03-12

## 2019-12-12 MED ORDER — FENOFIBRATE 145 MG PO TABS: 145.0000 mg | ORAL_TABLET | Freq: Every day | ORAL | 3 refills | Status: DC

## 2019-12-12 MED ORDER — SITAGLIPTIN PHOSPHATE 100 MG PO TABS: 100.0000 mg | ORAL_TABLET | Freq: Every day | ORAL | 1 refills | Status: DC

## 2019-12-12 MED ORDER — RYBELSUS 14 MG PO TABS: 1.0000 | ORAL_TABLET | Freq: Every day | ORAL | 1 refills | Status: DC

## 2019-12-12 NOTE — Progress Notes (Signed)
Melissa Gray , Jun 15, 1975, 44 y.o., female MRN: [id number] Patient Care Team    Relationship Specialty Notifications Start End  Melissa Hillock, DO PCP - General Family Medicine  06/03/15   Melissa Bullion, DO Consulting Physician Gastroenterology  11/16/17    Chief Complaint  Patient presents with  . Follow-up    [facility]    Subjective: Melissa Gray is a 44 y.o.  Uncontrolled type 2 diabetes mellitus without complication, without long-term current use of insulin (HCC)/Morbid obesity ([place]) Patient reports she has been compliance with Farxiga 10 mg daily, [id number]  daily and amaryl 4 BID and rybelsus. . Patient denies dizziness, hyperglycemic or hypoglycemic events. Patient denies numbness, tingling in the extremities or nonhealing wounds of feet.  Does not monitor her blood glucose levels, her preference.  She does have supplies. - she attended diabetes nutrition with her husband.  - Eye exam 03/27/2019 - Foot exam completed 05/30/2019 - Prevnar administered 6/7//2017; PNA23 completed 05/28/2016 - flu shot encouraged yearly - microalbumin UTD  -A1c; 7.8> 9.3> 9.6> 10.8> 9.6> 9.3> 8.7> 8.4>8.7> 8.7 today  Depression with anxiety/insomnia/attention deficit Patient reports she is doing well on effexor..  The trazodone she has not used often and prefers to try melatonin.  She states if the melatonin is not effective she may take 1/2-1 tab of the trazodone nightly.  Patient reports she has been seeing a therapist.     Hyperlipidemia: She reports compliance with lipitor.    ADD: Pt reports adderral 5 mg once a day- sometimes BID has made a world of difference for her.   Prior note: Pt presents for an OV to discuss her attention and concentration difficulty. She reports onset as young as first grade. Unfortunately she was never diagnosed at that time. She recently was seen by a psychologist who completed an ADHD evaluation on her and it was positive. She would like to discuss  medication start today. ADHD screening tool completed today and positive. .    Depression screen [facility] 2/9 12/12/2019 09/27/2019 05/30/2019 02/02/2019 06/14/2018  Decreased Interest _0 Down, Depressed, Hopeless _1 PHQ - 2 Score _2 Altered sleeping 3 2 0 3 3  Tired, decreased energy _3 Change in appetite 0 _4 Feeling bad or failure about yourself  _5 Trouble concentrating _6 Moving slowly or fidgety/restless 0 1 0 0 0  Suicidal thoughts 0 0 0 0 0  PHQ-9 Score _7 Difficult doing work/chores - Very difficult Somewhat difficult Somewhat difficult Very difficult  Some recent data might be hidden   GAD 7 : Generalized Anxiety Score 12/12/2019 09/27/2019 05/30/2019 02/02/2019  Nervous, Anxious, on Edge _8 Control/stop worrying _9 Worry too much - different things _10 Trouble relaxing _11 Restless _12 Easily annoyed or irritable _13 Afraid - awful might happen 1 0 2 2  Total GAD 7 Score _14 Anxiety Difficulty - Very difficult Somewhat difficult Somewhat difficult     Allergies  Allergen Reactions  . Tape Other (See Comments)    Adhesive tape causes blisters  . Levothyroxine Rash    Generic brand only caused rash   Social History  Tobacco Use  . Smoking status: Never Smoker  . Smokeless tobacco: Never Used  Substance Use Topics  . Alcohol use: Yes    Alcohol/week: 0.0 standard drinks    Comment: ocassionally   Past Medical History:  Diagnosis Date  . Anxiety   . COVID-19 07/2018  . Depression   . Diabetes mellitus without complication ([place])   . Elevated cholesterol   . Gallstones   . GERD (gastroesophageal reflux disease)   . Hematochezia 10/2017   Plan for colonoscopy  . Hyperplastic colon polyp 11/2017  . IBS (irritable bowel syndrome)    diarrhea predominant  . Infertility, female   . Obesity   . Thyroid disease 2004   treated for years, stopped 2014 per new MD    Past Surgical History:  Procedure Laterality Date  . CHOLECYSTECTOMY  2006  . CYST EXCISION    . GLOMUS TUMOR EXCISION  2005, 2012   finger and cheek   Family History  Problem Relation Age of Onset  . Diabetes Mother   . COPD Father   . Heart disease Father   . Early death Father   . Diabetes Brother   . Diabetes Maternal Grandmother   . Hearing loss Maternal Grandmother   . Dementia Maternal Grandmother   . Early death Maternal Grandfather   . Cancer Maternal Grandfather   . Breast cancer Paternal Grandmother   . Heart disease Paternal Grandfather   . Breast cancer Paternal Aunt   . Heart disease Paternal Uncle   . Heart attack Paternal Uncle   . Breast cancer Paternal Aunt   . Alcohol abuse Paternal Aunt   . Alzheimer's disease Paternal Aunt   . Colon cancer Neg Hx   . Esophageal cancer Neg Hx   . Rectal cancer Neg Hx   . Stomach cancer Neg Hx    Allergies as of 12/12/2019      Reactions   Tape Other (See Comments)   Adhesive tape causes blisters   Levothyroxine Rash   Generic brand only caused rash      Medication List       Accurate as of December 12, 2019  8:56 AM. If you have any questions, ask your nurse or doctor.        STOP taking these medications   Pen Needles 31G X 5 MM Misc Stopped by: Howard Pouch, DO     TAKE these medications   amphetamine-dextroamphetamine 10 MG tablet Commonly known as: Adderall 5 mg PO BID What changed: additional instructions Changed by: Howard Pouch, DO   atorvastatin 40 MG tablet Commonly known as: LIPITOR Take 1 tablet (40 mg total) by mouth daily.   dapagliflozin propanediol 10 MG Tabs tablet Commonly known as: Farxiga Take 1 tablet (10 mg total) by mouth daily before breakfast.   glimepiride 4 MG tablet Commonly known as: AMARYL Take 2 tablets (8 mg total) by mouth daily with breakfast.   MULTIVITAMIN ADULT PO Take by mouth.   Rybelsus 14 MG Tabs Generic drug: Semaglutide Take 1 tablet by mouth  daily. What changed:   medication strength  how much to take  how to take this  when to take this  additional instructions  Another medication with the same name was removed. Continue taking this medication, and follow the directions you see here. Changed by: Howard Pouch, DO   sitaGLIPtin 100 MG tablet Commonly known as: JANUVIA Take 1 tablet (100 mg total) by mouth daily.   traZODone 50 MG tablet Commonly  known as: DESYREL Take 0.5 tablets (25 mg total) by mouth at bedtime as needed for sleep. Start 1/2 tab QHS, increase by 1/2 tab every 3 nights if needed only. Max dose 2 tabs (100 mg)   venlafaxine XR 75 MG 24 hr capsule Commonly known as: Effexor XR Take 3 capsules (225 mg total) by mouth daily with breakfast.       Results for orders placed or performed in visit on 12/12/19 (from the past 24 hour(s))  POCT HgB A1C     Status: Abnormal   Collection Time: 12/12/19  8:26 AM  Result Value Ref Range   Hemoglobin A1C 8.7 (A) 4.0 - 5.6 %   HbA1c POC (<> result, manual entry) 8.7 4.0 - 5.6 %   HbA1c, POC (prediabetic range) 8.7 (A) 5.7 - 6.4 %   HbA1c, POC (controlled diabetic range) 8.7 (A) 0.0 - 7.0 %   No results found.   ROS: Negative, with the exception of above mentioned in HPI   Objective:  BP 102/71   Pulse 89   Temp 98.1 F (36.7 C) (Oral)   Ht _0  (1.676 m)   Wt 232 lb (105.2 kg)   SpO2 96%   BMI 37.45 kg/m  Body mass index is 37.45 kg/m. Gen: Afebrile. No acute distress. Nontoxic, pleasant obese female.  HENT: AT. [place].  Eyes:Pupils Equal Round Reactive to light, Extraocular movements intact,  Conjunctiva without redness, discharge or icterus. Neck/lymp/endocrine: Supple,no lymphadenopathy, no thyromegaly CV: RRR no murmur, no edema, +2/4 P posterior tibialis pulses Chest: CTAB, no wheeze or crackles Skin: no rashes, purpura or petechiae.  Neuro:  Normal gait. PERLA. EOMi. Alert. Oriented x3 Psych: Normal affect, dress and demeanor. Normal  speech. Normal thought content and judgment.   Results for orders placed or performed in visit on 12/12/19 (from the past 24 hour(s))  POCT HgB A1C     Status: Abnormal   Collection Time: 12/12/19  8:26 AM  Result Value Ref Range   Hemoglobin A1C 8.7 (A) 4.0 - 5.6 %   HbA1c POC (<> result, manual entry) 8.7 4.0 - 5.6 %   HbA1c, POC (prediabetic range) 8.7 (A) 5.7 - 6.4 %   HbA1c, POC (controlled diabetic range) 8.7 (A) 0.0 - 7.0 %    Assessment/Plan: Cobie Leidner is a 44 y.o. female present for OV for  Uncontrolled type 2 diabetes mellitus without complication, without long-term current use of insulin ([place]) Morbid obesity ([place]) - uncontrolled. She is aware if a1c does not start to decrease next appt we will refer to endocrine for diabetes management.  - She is intolerant to Metformin.   - increase exercise. Follow a diabatic diet> she is eating fast food.  - She does not want to start insulin or use injectable - continue Januvia 100 mg daily.  - increase Rybelsus 14 mg - continue  Amaryl to 8 mg daily, may split dose if desired  - continue Farxiga  10 mg daily. - start monitoring fasting sugars>> which she continues to not complete. - she attended diabetes nutrition with her husband.   - Eye exam  03/27/2019 - Foot exam completed 05/30/2019 - Prevnar administered 6/7//2017; PNA23 completed 05/28/2016 - flu shot encouraged yearly  - microalbumin UTD 05/30/2019 -A1c; 7.8> 9.3> 9.6> 10.8> 9.6> 9.3> 8.7>8.4> 8.7>8.7  today  Depression anxiety/insomnia/focus deficit: -stable - continue   Effexor  225 mg daily (three 75 mg tabs)  -continue  trazodone nightly PRN- mostly using melatonin.   -Referred to  therapist   - Follow-up in 3-4 months  Hypertriglyceridemia/On statin therapy -continue  Statin. - lipid, tsh, cbc, cmp collected today  Attention and concentration deficit Medication is working very well for her.  Continue Adderall 5 mg daily - can take afternoon dose if  desired [place] controlled substance database was reviewed 12/12/2019 - next appt UDS will be collected and controlled substance contract signed. Next appt 3-4 months.   Orders Placed This Encounter  Procedures  . CBC  . TSH  . Comp Met (CMET)  . Lipid panel  . POCT HgB A1C   Meds ordered this encounter  Medications  . atorvastatin (LIPITOR) 40 MG tablet    Sig: Take 1 tablet (40 mg total) by mouth daily.    Dispense:  90 tablet    Refill:  3  . dapagliflozin propanediol (FARXIGA) 10 MG TABS tablet    Sig: Take 1 tablet (10 mg total) by mouth daily before breakfast.    Dispense:  90 tablet    Refill:  1  . glimepiride (AMARYL) 4 MG tablet    Sig: Take 2 tablets (8 mg total) by mouth daily with breakfast.    Dispense:  180 tablet    Refill:  1  . sitaGLIPtin (JANUVIA) 100 MG tablet    Sig: Take 1 tablet (100 mg total) by mouth daily.    Dispense:  90 tablet    Refill:  1  . venlafaxine XR (EFFEXOR XR) 75 MG 24 hr capsule    Sig: Take 3 capsules (225 mg total) by mouth daily with breakfast.    Dispense:  270 capsule    Refill:  1    DC effexor 150  . Semaglutide (RYBELSUS) 14 MG TABS    Sig: Take 1 tablet by mouth daily.    Dispense:  90 tablet    Refill:  1  . DISCONTD: amphetamine-dextroamphetamine (ADDERALL) 10 MG tablet    Sig: Take 0.5 tablets (5 mg total) by mouth in the morning and at bedtime. 1 tab (10 mg) in the morning and 1/2 tab (5 mg) in the afternoon    Dispense:  90 tablet    Refill:  0  . amphetamine-dextroamphetamine (ADDERALL) 10 MG tablet    Sig: 5 mg PO BID    Dispense:  90 tablet    Refill:  0    Clarification on prior script     Reviewed expectations re: course of current medical issues.  Discussed self-management of symptoms.  Outlined signs and symptoms indicating need for more acute intervention.  Patient verbalized understanding and all questions were answered.  Patient received an After-Visit Summary.    electronically  signed by:  Howard Pouch, DO  [place]

## 2019-12-12 NOTE — Telephone Encounter (Signed)
Please call patient Melissa Gray are normal Blood cell counts and electrolytes are normal Glucose was severely elevated at the time of the appointment at 293. Her thyroid levels are just mildly above normal.  No medication needed at this time, however we will repeat the thyroid levels on her next routine follow-up appointment to ensure it returns back to normal. Cholesterol levels look great, however her triglycerides are extremely high at 600 (goal less than 150)    -Continue atorvastatin and we will need to start medication called fenofibrate which is a fiber-based medicine that focuses on lowering triglycerides.  This has been called in for her.  I would recommend she come fasting to her next appointment so that we can retest her triglycerides again.

## 2019-12-12 NOTE — Patient Instructions (Addendum)
Great to see you today. I hope you have a great holiday season!  I love the hair!!!  Your a1c today: 8.7 today.   Increase rybelsus to 14 mg a day.   All refills called in.  Follow in 3-4 mos

## 2019-12-13 NOTE — Telephone Encounter (Signed)
Spoke with pt regarding labs and instructions.   

## 2020-02-09 ENCOUNTER — Other Ambulatory Visit: Payer: Self-pay | Admitting: Family Medicine

## 2020-02-24 ENCOUNTER — Other Ambulatory Visit: Payer: Self-pay | Admitting: Family Medicine

## 2020-02-24 NOTE — Telephone Encounter (Signed)
Please advise 

## 2020-03-12 ENCOUNTER — Other Ambulatory Visit: Payer: Self-pay

## 2020-03-12 ENCOUNTER — Encounter: Payer: Self-pay | Admitting: Family Medicine

## 2020-03-12 ENCOUNTER — Telehealth: Payer: Self-pay | Admitting: Family Medicine

## 2020-03-12 ENCOUNTER — Ambulatory Visit: Payer: BC Managed Care – PPO | Admitting: Family Medicine

## 2020-03-12 VITALS — BP 119/78 | HR 74 | Temp 97.7°F | Ht 66.0 in | Wt 227.0 lb

## 2020-03-12 DIAGNOSIS — E119 Type 2 diabetes mellitus without complications: Secondary | ICD-10-CM

## 2020-03-12 DIAGNOSIS — R7989 Other specified abnormal findings of blood chemistry: Secondary | ICD-10-CM

## 2020-03-12 DIAGNOSIS — R41840 Attention and concentration deficit: Secondary | ICD-10-CM

## 2020-03-12 DIAGNOSIS — F99 Mental disorder, not otherwise specified: Secondary | ICD-10-CM

## 2020-03-12 DIAGNOSIS — E781 Pure hyperglyceridemia: Secondary | ICD-10-CM

## 2020-03-12 DIAGNOSIS — F5105 Insomnia due to other mental disorder: Secondary | ICD-10-CM

## 2020-03-12 DIAGNOSIS — E786 Lipoprotein deficiency: Secondary | ICD-10-CM

## 2020-03-12 DIAGNOSIS — F418 Other specified anxiety disorders: Secondary | ICD-10-CM

## 2020-03-12 LAB — LDL CHOLESTEROL, DIRECT: Direct LDL: 69 mg/dL

## 2020-03-12 LAB — LIPID PANEL
Cholesterol: 135 mg/dL (ref 0–200)
HDL: 25.2 mg/dL — ABNORMAL LOW (ref >39.00)
NonHDL: 109.5
Total CHOL/HDL Ratio: 5
Triglycerides: 366 mg/dL — ABNORMAL HIGH (ref 0.0–149.0)
VLDL: 73.2 mg/dL — ABNORMAL HIGH (ref 0.0–40.0)

## 2020-03-12 LAB — COMPREHENSIVE METABOLIC PANEL
ALT: 28 U/L (ref 0–35)
AST: 19 U/L (ref 0–37)
Albumin: 4.2 g/dL (ref 3.5–5.2)
Alkaline Phosphatase: 70 U/L (ref 39–117)
BUN: 12 mg/dL (ref 6–23)
CO2: 28 mEq/L (ref 19–32)
Calcium: 9.3 mg/dL (ref 8.4–10.5)
Chloride: 102 mEq/L (ref 96–112)
Creatinine, Ser: 0.75 mg/dL (ref 0.40–1.20)
GFR: 98.27 mL/min (ref >60.00)
Glucose, Bld: 214 mg/dL — ABNORMAL HIGH (ref 70–99)
Potassium: 3.7 mEq/L (ref 3.5–5.1)
Sodium: 137 mEq/L (ref 135–145)
Total Bilirubin: 0.8 mg/dL (ref 0.2–1.2)
Total Protein: 6.6 g/dL (ref 6.0–8.3)

## 2020-03-12 LAB — POCT GLYCOSYLATED HEMOGLOBIN (HGB A1C)
HbA1c POC (<> result, manual entry): 9.1 % (ref 4.0–5.6)
HbA1c, POC (controlled diabetic range): 9.1 % — AB (ref 0.0–7.0)
HbA1c, POC (prediabetic range): 9.1 % — AB (ref 5.7–6.4)
Hemoglobin A1C: 9.1 % — AB (ref 4.0–5.6)

## 2020-03-12 LAB — T4, FREE: Free T4: 0.9 ng/dL (ref 0.60–1.60)

## 2020-03-12 LAB — TSH: TSH: 4.57 u[IU]/mL — ABNORMAL HIGH (ref 0.35–4.50)

## 2020-03-12 MED ORDER — GLIMEPIRIDE 4 MG PO TABS: 8.0000 mg | ORAL_TABLET | Freq: Every day | ORAL | 1 refills | Status: DC

## 2020-03-12 MED ORDER — SITAGLIPTIN PHOSPHATE 100 MG PO TABS: 100.0000 mg | ORAL_TABLET | Freq: Every day | ORAL | 1 refills | Status: DC

## 2020-03-12 MED ORDER — COLESEVELAM HCL 625 MG PO TABS: 1250.0000 mg | ORAL_TABLET | Freq: Two times a day (BID) | ORAL | 5 refills | Status: DC

## 2020-03-12 MED ORDER — RYBELSUS 14 MG PO TABS: 1.0000 | ORAL_TABLET | Freq: Every day | ORAL | 1 refills | Status: DC

## 2020-03-12 MED ORDER — LIOTHYRONINE SODIUM 5 MCG PO TABS: 10.0000 ug | ORAL_TABLET | Freq: Every day | ORAL | 1 refills | Status: DC

## 2020-03-12 MED ORDER — VENLAFAXINE HCL ER 75 MG PO CP24: 225.0000 mg | ORAL_CAPSULE | Freq: Every day | ORAL | 1 refills | Status: DC

## 2020-03-12 MED ORDER — AMPHETAMINE-DEXTROAMPHETAMINE 10 MG PO TABS: ORAL_TABLET | ORAL | 0 refills | Status: DC

## 2020-03-12 MED ORDER — DAPAGLIFLOZIN PROPANEDIOL 10 MG PO TABS: 10.0000 mg | ORAL_TABLET | Freq: Every day | ORAL | 1 refills | Status: DC

## 2020-03-12 MED ORDER — FENOFIBRATE 120 MG PO TABS: 120.0000 mg | ORAL_TABLET | Freq: Every day | ORAL | 3 refills | Status: DC

## 2020-03-12 NOTE — Progress Notes (Signed)
Melissa Gray , September 05, 1975, 45 y.o., female MRN: [id number] Patient Care Team    Relationship Specialty Notifications Start End  Ma Hillock, DO PCP - General Family Medicine  06/03/15   Lavena Bullion, DO Consulting Physician Gastroenterology  11/16/17    Chief Complaint  Patient presents with  . Follow-up    CMC; pt is fasting     Subjective: Melissa Gray is a 45 y.o.  Uncontrolled type 2 diabetes mellitus without complication, without long-term current use of insulin (HCC)/Morbid obesity ([place]) Patient reports she has been compliant with Farxiga 10 mg daily, [id number]  Daily, amaryl 4 BID and rybelsus 14 mg. Patient denies dizziness, hyperglycemic or hypoglycemic events. Patient denies numbness, tingling in the extremities or nonhealing wounds of feet.  Does not monitor her blood glucose levels, her preference.  She does have supplies. She has lost 5 lbs in the last 3 months.  - she attended diabetes nutrition with her husband.  - Eye exam 03/27/2019> encouraged her to schedule - Foot exam completed 05/30/2019 - Prevnar administered 6/7//2017; PNA23 completed 05/28/2016 - flu shot encouraged yearly - microalbumin UTD  -A1c; 7.8> 9.3> 9.6> 10.8> 9.6> 9.3> 8.7> 8.4>8.7> 8.7> 9.1 today  Depression with anxiety/insomnia/attention deficit Patient reports she is feeling well on effexor.  The trazodone she has not used often and prefers to try melatonin.  She states if the melatonin is not effective she may take 1/2-1 tab of the trazodone nightly.  Patient reports she has been seeing a therapist.     Hyperlipidemia: She reports compliance  with Lipitor, omega 3 and fenofibrate.     ADD: Pt reports compliance with adderral 5 mg once a day- sometimes BID. She feels it is working well for her. Prior note: Pt presents for an OV to discuss her attention and concentration difficulty. She reports onset as young as first grade. Unfortunately she was never diagnosed at that time.  She recently was seen by a psychologist who completed an ADHD evaluation on her and it was positive. She would like to discuss medication start today. ADHD screening tool completed today and positive. .    Depression screen [facility] 2/9 03/12/2020 12/12/2019 09/27/2019 05/30/2019 02/02/2019  Decreased Interest 0 1 1 2 1   Down, Depressed, Hopeless 1 2 1 2 1   PHQ - 2 Score 1 3 2 4 2   Altered sleeping 2 3 2  0 3  Tired, decreased energy 1 1 1 2 1   Change in appetite 0 0 1 1 1   Feeling bad or failure about yourself  1 2 2 2 1   Trouble concentrating 1 1 2 2 1   Moving slowly or fidgety/restless 1 0 1 0 0  Suicidal thoughts 0 0 0 0 0  PHQ-9 Score 7 10 11 11 9   Difficult doing work/chores - - Very difficult Somewhat difficult Somewhat difficult  Some recent data might be hidden   GAD 7 : Generalized Anxiety Score 03/12/2020 12/12/2019 09/27/2019 05/30/2019  Nervous, Anxious, on Edge 1 1 1 3   Control/stop worrying 2 2 1 3   Worry too much - different things 2 3 1 3   Trouble relaxing 1 1 1 2   Restless 1 3 1 2   Easily annoyed or irritable 1 1 1 2   Afraid - awful might happen 1 1 0 2  Total GAD 7 Score 9 12 6 17   Anxiety Difficulty Somewhat difficult - Very difficult Somewhat difficult     Allergies  Allergen Reactions  . Tape Other (See  Comments)    Adhesive tape causes blisters  . Levothyroxine Rash    Generic brand only caused rash   Social History   Tobacco Use  . Smoking status: Never Smoker  . Smokeless tobacco: Never Used  Substance Use Topics  . Alcohol use: Yes    Alcohol/week: 0.0 standard drinks    Comment: ocassionally   Past Medical History:  Diagnosis Date  . Anxiety   . COVID-19 07/2018  . Depression   . Diabetes mellitus without complication ([place])   . Elevated cholesterol   . Gallstones   . GERD (gastroesophageal reflux disease)   . Hematochezia 10/2017   Plan for colonoscopy  . Hyperplastic colon polyp 11/2017  . IBS (irritable bowel syndrome)    diarrhea predominant   . Infertility, female   . Obesity   . Thyroid disease 2004   treated for years, stopped 2014 per new MD   Past Surgical History:  Procedure Laterality Date  . CHOLECYSTECTOMY  2006  . CYST EXCISION    . GLOMUS TUMOR EXCISION  2005, 2012   finger and cheek   Family History  Problem Relation Age of Onset  . Diabetes Mother   . COPD Father   . Heart disease Father   . Early death Father   . Diabetes Brother   . Diabetes Maternal Grandmother   . Hearing loss Maternal Grandmother   . Dementia Maternal Grandmother   . Early death Maternal Grandfather   . Cancer Maternal Grandfather   . Breast cancer Paternal Grandmother   . Heart disease Paternal Grandfather   . Breast cancer Paternal Aunt   . Heart disease Paternal Uncle   . Heart attack Paternal Uncle   . Breast cancer Paternal Aunt   . Alcohol abuse Paternal Aunt   . Alzheimer's disease Paternal Aunt   . Colon cancer Neg Hx   . Esophageal cancer Neg Hx   . Rectal cancer Neg Hx   . Stomach cancer Neg Hx    Allergies as of 03/12/2020      Reactions   Tape Other (See Comments)   Adhesive tape causes blisters   Levothyroxine Rash   Generic brand only caused rash      Medication List       Accurate as of March 12, 2020  9:26 AM. If you have any questions, ask your nurse or doctor.        amphetamine-dextroamphetamine 10 MG tablet Commonly known as: Adderall 5 mg PO BID   atorvastatin 40 MG tablet Commonly known as: LIPITOR Take 1 tablet (40 mg total) by mouth daily.   dapagliflozin propanediol 10 MG Tabs tablet Commonly known as: Farxiga Take 1 tablet (10 mg total) by mouth daily before breakfast.   Fenofibrate 120 MG Tabs Take 1 tablet (120 mg total) by mouth daily. What changed:   medication strength  how much to take Changed by: Howard Pouch, DO   glimepiride 4 MG tablet Commonly known as: AMARYL Take 2 tablets (8 mg total) by mouth daily with breakfast.   MULTIVITAMIN ADULT PO Take by  mouth.   Rybelsus 14 MG Tabs Generic drug: Semaglutide Take 1 tablet by mouth daily.   sitaGLIPtin 100 MG tablet Commonly known as: JANUVIA Take 1 tablet (100 mg total) by mouth daily.   traZODone 50 MG tablet Commonly known as: DESYREL Take 0.5 tablets (25 mg total) by mouth at bedtime as needed for sleep. Start 1/2 tab QHS, increase by 1/2 tab every 3 nights  if needed only. Max dose 2 tabs (100 mg)   venlafaxine XR 75 MG 24 hr capsule Commonly known as: Effexor XR Take 3 capsules (225 mg total) by mouth daily with breakfast.       Results for orders placed or performed in visit on 03/12/20 (from the past 24 hour(s))  POCT HgB A1C     Status: Abnormal   Collection Time: 03/12/20  8:43 AM  Result Value Ref Range   Hemoglobin A1C 9.1 (A) 4.0 - 5.6 %   HbA1c POC (<> result, manual entry) 9.1 4.0 - 5.6 %   HbA1c, POC (prediabetic range) 9.1 (A) 5.7 - 6.4 %   HbA1c, POC (controlled diabetic range) 9.1 (A) 0.0 - 7.0 %   No results found.   ROS: Negative, with the exception of above mentioned in HPI   Objective:  BP 119/78   Pulse 74   Temp 97.7 F (36.5 C) (Oral)   Ht 5' 6"  (1.676 m)   Wt 227 lb (103 kg)   SpO2 98%   BMI 36.64 kg/m  Body mass index is 36.64 kg/m. Gen: Afebrile. No acute distress. Nontoxic, pleasant female.  HENT: AT. [place] Eyes:Pupils Equal Round Reactive to light, Extraocular movements intact,  Conjunctiva without redness, discharge or icterus. Neck/lymp/endocrine: Supple,no lymphadenopathy, no thyromegaly CV: RRR no murmur, no edema, +2/4 P posterior tibialis pulses Chest: CTAB, no wheeze or crackles Skin: no rashes, purpura or petechiae.  Neuro:  Normal gait. PERLA. EOMi. Alert. Oriented x3 Psych: Normal affect, dress and demeanor. Normal speech. Normal thought content and judgment.   Results for orders placed or performed in visit on 03/12/20 (from the past 24 hour(s))  POCT HgB A1C     Status: Abnormal   Collection Time: 03/12/20  8:43 AM   Result Value Ref Range   Hemoglobin A1C 9.1 (A) 4.0 - 5.6 %   HbA1c POC (<> result, manual entry) 9.1 4.0 - 5.6 %   HbA1c, POC (prediabetic range) 9.1 (A) 5.7 - 6.4 %   HbA1c, POC (controlled diabetic range) 9.1 (A) 0.0 - 7.0 %    Assessment/Plan: Elwanda Moger is a 45 y.o. female present for OV for  Uncontrolled type 2 diabetes mellitus without complication, without long-term current use of insulin ([place]) Morbid obesity ([place]) - uncontrolled.- and increasing> referral to endocrine placed today - She is intolerant to Metformin.   - increase exercise. Follow a diabatic diet> she has lost 5 lbs, but not following diabetic diet.  - She does not want to start insulin or use injectable- but understands we are out of options and she most likely will need to start insulin.  - continue  Januvia 100 mg daily.  - continue  Rybelsus 14 mg - continue Amaryl to 8 mg daily, may split dose if desired  - continue  Farxiga  10 mg daily. - start monitoring fasting sugars>> which she continues to not complete. - she attended diabetes nutrition with her husband.   - Eye exam  03/27/2019> encouraged her to schedule.  - Foot exam completed 05/30/2019 - Prevnar administered 6/7//2017; PNA23 completed 05/28/2016 - flu shot encouraged yearly  - microalbumin UTD 05/30/2019 -A1c; 7.8> 9.3> 9.6> 10.8> 9.6> 9.3> 8.7>8.4> 8.7>8.7 >9.1 today  Depression anxiety/insomnia/focus deficit: - stable.  - continue   Effexor  225 mg daily (three 75 mg tabs)  -trazodone nightly PRN- mostly using melatonin.   -Referred to therapist     Hypertriglyceridemia/On statin therapy - continue Statin. - labs utd  Attention and concentration deficit Stable.  Continue Adderall 5 mg daily - can take afternoon dose if desired [place] controlled substance database was reviewed 03/12/20 - next appt UDS will be collected and controlled substance contract signed.  F/u 5/5 mos on chronic conditions and with endocrine for  diabetes.   Orders Placed This Encounter  Procedures  . TSH  . T4, free  . Lipid panel  . Comp Met (CMET)  . DRUG MONITORING, PANEL 8 WITH CONFIRMATION, URINE  . Ambulatory referral to Endocrinology  . POCT HgB A1C   Meds ordered this encounter  Medications  . venlafaxine XR (EFFEXOR XR) 75 MG 24 hr capsule    Sig: Take 3 capsules (225 mg total) by mouth daily with breakfast.    Dispense:  270 capsule    Refill:  1    DC effexor 150  . sitaGLIPtin (JANUVIA) 100 MG tablet    Sig: Take 1 tablet (100 mg total) by mouth daily.    Dispense:  90 tablet    Refill:  1  . Semaglutide (RYBELSUS) 14 MG TABS    Sig: Take 1 tablet by mouth daily.    Dispense:  90 tablet    Refill:  1  . glimepiride (AMARYL) 4 MG tablet    Sig: Take 2 tablets (8 mg total) by mouth daily with breakfast.    Dispense:  180 tablet    Refill:  1  . dapagliflozin propanediol (FARXIGA) 10 MG TABS tablet    Sig: Take 1 tablet (10 mg total) by mouth daily before breakfast.    Dispense:  90 tablet    Refill:  1  . fenofibrate 120 MG TABS    Sig: Take 1 tablet (120 mg total) by mouth daily.    Dispense:  90 tablet    Refill:  3    Can switch to dose on formulary nearest to prescribed dose.  Marland Kitchen amphetamine-dextroamphetamine (ADDERALL) 10 MG tablet    Sig: 5 mg PO BID    Dispense:  90 tablet    Refill:  0    Clarification on prior script     Reviewed expectations re: course of current medical issues.  Discussed self-management of symptoms.  Outlined signs and symptoms indicating need for more acute intervention.  Patient verbalized understanding and all questions were answered.  Patient received an After-Visit Summary.    electronically signed by:  Howard Pouch, DO  [place]

## 2020-03-12 NOTE — Telephone Encounter (Signed)
Please call patient: Liver and kidney function are normal Cholesterol panel looks great with the exception of her triglycerides.  Her triglycerides have improved a great deal on the fenofibrate from 600, now 366.  However triglycerides should be less than 150.   -Continue the statin and the fenofibrate.  Continue over-the-counter fish oil supplement at 3000-4000 mg a day and start a new medication have called in named welchol.    - Welchol is 2 tabs twice a day.  This helps both with diabetes and triglycerides.  Her thyroid function is also above goal. I have called in a medicine called Cytomel.  This is a thyroid replacement, with a little quicker onset than the traditional levothyroxine in which she developed a rash.  She should take 2 tabs of Cytomel on a empty stomach daily.   Follow-up in 10 weeks with provider and we will need to retest her cholesterol and thyroid at that appointment.  Please ask her to be fasting.

## 2020-03-12 NOTE — Patient Instructions (Signed)
I have referred you to endocrine to get a better control on your diabetes. Once they have you on a stable regimen, I would be happy taking over management of your diabetes again if you want.    High Triglycerides Eating Plan Triglycerides are a type of fat in the blood. High levels of triglycerides can increase your risk of heart disease and stroke. If your triglyceride levels are high, choosing the right foods can help lower your triglycerides and keep your heart healthy. Work with your health care provider or a diet and nutrition specialist (dietitian) to develop an eating plan that is right for you. What are tips for following this plan? General guidelines  Lose weight, if you are overweight. For most people, losing 5-10 lbs (2-5 kg) helps lower triglyceride levels. A weight-loss plan may include. ? 30 minutes of exercise at least 5 days a week. ? Reducing the amount of calories, sugar, and fat you eat.  Eat a wide variety of fresh fruits, vegetables, and whole grains. These foods are high in fiber.  Eat foods that contain healthy fats, such as fatty fish, nuts, seeds, and olive oil.  Avoid foods that are high in added sugar, added salt (sodium), saturated fat, and trans fat.  Avoid low-fiber, refined carbohydrates such as white bread, crackers, noodles, and white rice.  Avoid foods with partially hydrogenated oils (trans fats), such as fried foods or stick margarine.  Limit alcohol intake to no more than 1 drink a day for nonpregnant women and 2 drinks a day for men. One drink equals 12 oz of beer, 5 oz of wine, or 1 oz of hard liquor. Your health care provider may recommend that you drink less depending on your overall health.   Reading food labels  Check food labels for the amount of saturated fat. Choose foods with no or very little saturated fat.  Check food labels for the amount of trans fat. Choose foods with no trans fat.  Check food labels for the amount of cholesterol.  Choose foods low in cholesterol. Ask your dietitian how much cholesterol you should have each day.  Check food labels for the amount of sodium. Choose foods with less than 140 milligrams (mg) per serving. Shopping  Buy dairy products labeled as nonfat (skim) or low-fat (1%).  Avoid buying processed or prepackaged foods. These are often high in added sugar, sodium, and fat. Cooking  Choose healthy fats when cooking, such as olive oil or canola oil.  Cook foods using lower fat methods, such as baking, broiling, boiling, or grilling.  Make your own sauces, dressings, and marinades when possible, instead of buying them. Store-bought sauces, dressings, and marinades are often high in sodium and sugar. Meal planning  Eat more home-cooked food and less restaurant, buffet, and fast food.  Eat fatty fish at least 2 times each week. Examples of fatty fish include salmon, trout, mackerel, tuna, and herring.  If you eat whole eggs, do not eat more than 3 egg yolks per week. What foods are recommended? The items listed may not be a complete list. Talk with your dietitian about what dietary choices are best for you. Grains Whole wheat or whole grain breads, crackers, cereals, and pasta. Unsweetened oatmeal. Bulgur. Barley. Quinoa. Brown rice. Whole wheat flour tortillas. Vegetables Fresh or frozen vegetables. Low-sodium canned vegetables. Fruits All fresh, canned (in natural juice), or frozen fruits. Meats and other protein foods Skinless chicken or [place]. Ground chicken or [place]. Lean cuts of pork, trimmed  of fat. Fish and seafood, especially salmon, trout, and herring. Egg whites. Dried beans, peas, or lentils. Unsalted nuts or seeds. Unsalted canned beans. Natural peanut or almond butter. Dairy Low-fat dairy products. Skim or low-fat (1%) milk. Reduced fat (2%) and low-sodium cheese. Low-fat ricotta cheese. Low-fat cottage cheese. Plain, low-fat yogurt. Fats and oils Tub margarine without  trans fats. Light or reduced-fat mayonnaise. Light or reduced-fat salad dressings. Avocado. Safflower, olive, sunflower, soybean, and canola oils. What foods are not recommended? The items listed may not be a complete list. Talk with your dietitian about what dietary choices are best for you. Grains White bread. White (regular) pasta. White rice. Cornbread. Bagels. Pastries. Crackers that contain trans fat. Vegetables Creamed or fried vegetables. Vegetables in a cheese sauce. Fruits Sweetened dried fruit. Canned fruit in syrup. Fruit juice. Meats and other protein foods Fatty cuts of meat. Ribs. Chicken wings. Berniece Salines. Sausage. Bologna. Salami. Chitterlings. Fatback. Hot dogs. Bratwurst. Packaged lunch meats. Dairy Whole or reduced-fat (2%) milk. Half-and-half. Cream cheese. Full-fat or sweetened yogurt. Full-fat cheese. Nondairy creamers. Whipped toppings. Processed cheese or cheese spreads. Cheese curds. Beverages Alcohol. Sweetened drinks, such as soda, lemonade, fruit drinks, or punches. Fats and oils Butter. Stick margarine. Lard. Shortening. Ghee. Bacon fat. Tropical oils, such as coconut, palm kernel, or palm oils. Sweets and desserts Corn syrup. Sugars. Honey. Molasses. Candy. Jam and jelly. Syrup. Sweetened cereals. Cookies. Pies. Cakes. Donuts. Muffins. Ice cream. Condiments Store-bought sauces, dressings, and marinades that are high in sugar, such as ketchup and barbecue sauce. Summary  High levels of triglycerides can increase the risk of heart disease and stroke. Choosing the right foods can help lower your triglycerides.  Eat plenty of fresh fruits, vegetables, and whole grains. Choose low-fat dairy and lean meats. Eat fatty fish at least twice a week.  Avoid processed and prepackaged foods with added sugar, sodium, saturated fat, and trans fat.  If you need suggestions or have questions about what types of food are good for you, talk with your health care provider or a  dietitian. This information is not intended to replace advice given to you by your health care provider. Make sure you discuss any questions you have with your health care provider. Document Revised: 05/16/2019 Document Reviewed: 05/16/2019 Elsevier Patient Education  2021 [place].   Diabetes Mellitus and Exercise Exercising regularly is important for overall health, especially for people who have diabetes mellitus. Exercising is not only about losing weight. It has many other health benefits, such as increasing muscle strength and bone density and reducing body fat and stress. This leads to improved fitness, flexibility, and endurance, all of which result in better overall health. What are the benefits of exercise if I have diabetes? Exercise has many benefits for people with diabetes. They include:  Helping to lower and control blood sugar (glucose).  Helping the body to respond better to the hormone insulin by improving insulin sensitivity.  Reducing how much insulin the body needs.  Lowering the risk for heart disease by: ? Lowering "bad" cholesterol and triglyceride levels. ? Increasing "good" cholesterol levels. ? Lowering blood pressure. ? Lowering blood glucose levels. What is my activity plan? Your health care provider or certified diabetes educator can help you make a plan for the type and frequency of exercise that works for you. This is called your activity plan. Be sure to:  Get at least 150 minutes of medium-intensity or high-intensity exercise each week. Exercises may include brisk walking, biking, or water aerobics.  Do stretching and strengthening exercises, such as yoga or weight lifting, at least 2 times a week.  Spread out your activity over at least 3 days of the week.  Get some form of physical activity each day. ? Do not go more than 2 days in a row without some kind of physical activity. ? Avoid being inactive for more than 90 minutes at a time. Take  frequent breaks to walk or stretch.  Choose exercises or activities that you enjoy. Set realistic goals.  Start slowly and gradually increase your exercise intensity over time.   How do I manage my diabetes during exercise? Monitor your blood glucose  Check your blood glucose before and after exercising. If your blood glucose is: ? 240 mg/dL (13.3 mmol/L) or higher before you exercise, check your urine for ketones. These are chemicals created by the liver. If you have ketones in your urine, do not exercise until your blood glucose returns to normal. ? 100 mg/dL (5.6 mmol/L) or lower, eat a snack containing 15-20 grams of carbohydrate. Check your blood glucose 15 minutes after the snack to make sure that your glucose level is above 100 mg/dL (5.6 mmol/L) before you start your exercise.  Know the symptoms of low blood glucose (hypoglycemia) and how to treat it. Your risk for hypoglycemia increases during and after exercise. Follow these tips and your health care provider's instructions  Keep a carbohydrate snack that is fast-acting for use before, during, and after exercise to help prevent or treat hypoglycemia.  Avoid injecting insulin into areas of the body that are going to be exercised. For example, avoid injecting insulin into: ? Your arms, when you are about to play tennis. ? Your legs, when you are about to go jogging.  Keep records of your exercise habits. Doing this can help you and your health care provider adjust your diabetes management plan as needed. Write down: ? Food that you eat before and after you exercise. ? Blood glucose levels before and after you exercise. ? The type and amount of exercise you have done.  Work with your health care provider when you start a new exercise or activity. He or she may need to: ? Make sure that the activity is safe for you. ? Adjust your insulin, other medicines, and food that you eat.  Drink plenty of water while you exercise. This  prevents loss of water (dehydration) and problems caused by a lot of heat in the body (heat stroke).   Where to find more information  American Diabetes Association: www.diabetes.org Summary  Exercising regularly is important for overall health, especially for people who have diabetes mellitus.  Exercising has many health benefits. It increases muscle strength and bone density and reduces body fat and stress. It also lowers and controls blood glucose.  Your health care provider or certified diabetes educator can help you make an activity plan for the type and frequency of exercise that works for you.  Work with your health care provider to make sure any new activity is safe for you. Also work with your health care provider to adjust your insulin, other medicines, and the food you eat. This information is not intended to replace advice given to you by your health care provider. Make sure you discuss any questions you have with your health care provider. Document Revised: 10/09/2018 Document Reviewed: 10/09/2018 Elsevier Patient Education  [place].

## 2020-03-13 LAB — DRUG MONITORING, PANEL 8 WITH CONFIRMATION, URINE
6 Acetylmorphine: NEGATIVE ng/mL (ref <10)
Alcohol Metabolites: NEGATIVE ng/mL
Amphetamines: NEGATIVE ng/mL (ref <500)
Benzodiazepines: NEGATIVE ng/mL (ref <100)
Buprenorphine, Urine: NEGATIVE ng/mL (ref <5)
Cocaine Metabolite: NEGATIVE ng/mL (ref <150)
Creatinine: 36.5 mg/dL
MDMA: NEGATIVE ng/mL (ref <500)
Marijuana Metabolite: NEGATIVE ng/mL (ref <20)
Opiates: NEGATIVE ng/mL (ref <100)
Oxidant: NEGATIVE ug/mL
Oxycodone: NEGATIVE ng/mL (ref <100)
pH: 5.2 (ref 4.5–9.0)

## 2020-03-13 LAB — DM TEMPLATE

## 2020-03-13 NOTE — Telephone Encounter (Signed)
LM for pt to return call to discuss.  

## 2020-03-14 NOTE — Telephone Encounter (Signed)
LVM for pt to CB regarding results.  

## 2020-03-14 NOTE — Telephone Encounter (Signed)
MyChart message read.

## 2020-03-28 ENCOUNTER — Ambulatory Visit (INDEPENDENT_AMBULATORY_CARE_PROVIDER_SITE_OTHER): Payer: BC Managed Care – PPO | Admitting: Family Medicine

## 2020-03-28 ENCOUNTER — Encounter: Payer: Self-pay | Admitting: Family Medicine

## 2020-03-28 ENCOUNTER — Other Ambulatory Visit: Payer: Self-pay

## 2020-03-28 VITALS — BP 124/81 | HR 100 | Temp 98.3°F | Ht 65.25 in | Wt 223.0 lb

## 2020-03-28 DIAGNOSIS — Z0000 Encounter for general adult medical examination without abnormal findings: Secondary | ICD-10-CM | POA: Diagnosis not present

## 2020-03-28 DIAGNOSIS — Z1231 Encounter for screening mammogram for malignant neoplasm of breast: Secondary | ICD-10-CM

## 2020-03-28 NOTE — Progress Notes (Signed)
This visit occurred during the SARS-CoV-2 public health emergency.  Safety protocols were in place, including screening questions prior to the visit, additional usage of staff PPE, and extensive cleaning of exam room while observing appropriate contact time as indicated for disinfecting solutions.    Patient ID: Melissa Gray, female  DOB: [birth year], 46 y.o.   MRN: [id number] Patient Care Team    Relationship Specialty Notifications Start End  Ma Hillock, DO PCP - General Family Medicine  06/03/15   Lavena Bullion, DO Consulting Physician Gastroenterology  11/16/17   Nunzio Cobbs, MD Consulting Physician Obstetrics and Gynecology  03/28/20     Chief Complaint  Patient presents with  . Annual Exam    Pt is not fating;     Subjective:  Melissa Gray is a 45 y.o.  Female  present for CPE. All past medical history, surgical history, allergies, family history, immunizations, medications and social history were updated in the electronic medical record today. All recent labs, ED visits and hospitalizations within the last year were reviewed.  Health maintenance:  Colonoscopy: No FHX, routine screening at age 48 Mammogram: [place] present aunt was positive for BRCA. Completed 02/2018>ordered Cervical cancer screening:01/2018, no abnl pap. Dr. Quincy Simmonds Immunizations: 2014 tdap UTD, flu UTD. PNA UTD, covid series x3  Infectious disease screening: HIV and Hep C completed 2007 DEXA: routine screen at 24.  Assistive device: none Oxygen RAQ:TMAU Patient has a Dental home. Hospitalizations/ED visits: reviewed   Depression screen [facility] 2/9 03/28/2020 03/12/2020 12/12/2019 09/27/2019 05/30/2019  Decreased Interest 1 0 1 1 2   Down, Depressed, Hopeless 1 1 2 1 2   PHQ - 2 Score 2 1 3 2 4   Altered sleeping 2 2 3 2  0  Tired, decreased energy 2 1 1 1 2   Change in appetite 1 0 0 1 1  Feeling bad or failure about yourself  1 1 2 2 2   Trouble concentrating 1 1 1 2 2   Moving slowly or  fidgety/restless 1 1 0 1 0  Suicidal thoughts 0 0 0 0 0  PHQ-9 Score 10 7 10 11 11   Difficult doing work/chores - - - Very difficult Somewhat difficult  Some recent data might be hidden   GAD 7 : Generalized Anxiety Score 03/28/2020 03/12/2020 12/12/2019 09/27/2019  Nervous, Anxious, on Edge 1 1 1 1   Control/stop worrying 2 2 2 1   Worry too much - different things 2 2 3 1   Trouble relaxing 1 1 1 1   Restless 2 1 3 1   Easily annoyed or irritable 2 1 1 1   Afraid - awful might happen 1 1 1  0  Total GAD 7 Score 11 9 12 6   Anxiety Difficulty - Somewhat difficult - Very difficult     Immunization History  Administered Date(s) Administered  . Influenza Inj Mdck Quad Pf 11/18/2017  . Influenza,inj,Quad PF,6+ Mos 11/29/2016, 02/02/2019, 09/27/2019  . Influenza-Unspecified 12/01/2015, 11/29/2016  . PFIZER(Purple Top)SARS-COV-2 Vaccination 04/10/2019, 05/01/2019, 11/01/2019  . Pneumococcal Conjugate-13 07/02/2015  . Pneumococcal Polysaccharide-23 06/17/2016     Past Medical History:  Diagnosis Date  . Anxiety   . COVID-19 07/2018  . Depression   . Diabetes mellitus without complication ([place])   . Elevated cholesterol   . Gallstones   . GERD (gastroesophageal reflux disease)   . Hematochezia 10/2017   Plan for colonoscopy  . Hyperplastic colon polyp 11/2017  . IBS (irritable bowel syndrome)    diarrhea predominant  . Infertility, female   .  Obesity   . Thyroid disease 2004   treated for years, stopped 2014 per new MD   Allergies  Allergen Reactions  . Tape Other (See Comments)    Adhesive tape causes blisters  . Levothyroxine Rash    Generic brand only caused rash   Past Surgical History:  Procedure Laterality Date  . CHOLECYSTECTOMY  2006  . CYST EXCISION    . GLOMUS TUMOR EXCISION  2005, 2012   finger and cheek   Family History  Problem Relation Age of Onset  . Diabetes Mother   . COPD Father   . Heart disease Father   . Early death Father   . Diabetes Brother   .  Diabetes Maternal Grandmother   . Hearing loss Maternal Grandmother   . Dementia Maternal Grandmother   . Early death Maternal Grandfather   . Cancer Maternal Grandfather   . Breast cancer Paternal Grandmother   . Heart disease Paternal Grandfather   . Breast cancer Paternal Aunt   . Heart disease Paternal Uncle   . Heart attack Paternal Uncle   . Breast cancer Paternal Aunt   . Alcohol abuse Paternal Aunt   . Alzheimer's disease Paternal Aunt   . Colon cancer Neg Hx   . Esophageal cancer Neg Hx   . Rectal cancer Neg Hx   . Stomach cancer Neg Hx    Social History   Social History Narrative   Married. Husband's name is Audelia Acton. No children.   2 pets.   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   Drinks caffeine   Wears her seatbelt   Smoke detector in home   Safe in her relationship.    Allergies as of 03/28/2020      Reactions   Tape Other (See Comments)   Adhesive tape causes blisters   Levothyroxine Rash   Generic brand only caused rash      Medication List       Accurate as of March 28, 2020  3:46 PM. If you have any questions, ask your nurse or doctor.        STOP taking these medications   Fenofibrate 120 MG Tabs Stopped by: Howard Pouch, DO     TAKE these medications   amphetamine-dextroamphetamine 10 MG tablet Commonly known as: Adderall 5 mg PO BID   atorvastatin 40 MG tablet Commonly known as: LIPITOR Take 1 tablet (40 mg total) by mouth daily.   colesevelam 625 MG tablet Commonly known as: WELCHOL Take 2 tablets (1,250 mg total) by mouth 2 (two) times daily with a meal.   dapagliflozin propanediol 10 MG Tabs tablet Commonly known as: Farxiga Take 1 tablet (10 mg total) by mouth daily before breakfast.   fenofibrate micronized 134 MG capsule Commonly known as: LOFIBRA Take 134 mg by mouth daily.   FISH OIL PO Take 3,000 mg by mouth.   glimepiride 4 MG tablet Commonly known as: AMARYL Take 2 tablets (8 mg total) by mouth  daily with breakfast.   liothyronine 5 MCG tablet Commonly known as: CYTOMEL Take 2 tablets (10 mcg total) by mouth daily.   MULTIVITAMIN ADULT PO Take by mouth.   Rybelsus 14 MG Tabs Generic drug: Semaglutide Take 1 tablet by mouth daily.   sitaGLIPtin 100 MG tablet Commonly known as: JANUVIA Take 1 tablet (100 mg total) by mouth daily.   traZODone 50 MG tablet Commonly known as: DESYREL Take 0.5 tablets (25 mg total) by mouth at bedtime as needed for sleep. Start  1/2 tab QHS, increase by 1/2 tab every 3 nights if needed only. Max dose 2 tabs (100 mg)   venlafaxine XR 75 MG 24 hr capsule Commonly known as: Effexor XR Take 3 capsules (225 mg total) by mouth daily with breakfast.       All past medical history, surgical history, allergies, family history, immunizations andmedications were updated in the EMR today and reviewed under the history and medication portions of their EMR.      ROS: 14 pt review of systems performed and negative (unless mentioned in an HPI)  Objective: BP 124/81   Pulse 100   Temp 98.3 F (36.8 C) (Oral)   Ht 5' 5.25" (1.657 m)   Wt 223 lb (101.2 kg)   LMP 02/29/2020   SpO2 98%   BMI 36.83 kg/m  Gen: Afebrile. No acute distress. Nontoxic in appearance, well-developed, well-nourished, does not, obese female. HENT: AT. [place]. Bilateral TM visualized and normal in appearance, normal external auditory canal. MMM, no oral lesions, adequate dentition. Bilateral nares within normal limits. Throat without erythema, ulcerations or exudates.  No cough on exam, no hoarseness on exam. Eyes:Pupils Equal Round Reactive to light, Extraocular movements intact,  Conjunctiva without redness, discharge or icterus. Neck/lymp/endocrine: Supple, no lymphadenopathy, no thyromegaly CV: RRR no murmur, no edema, +2/4 P posterior tibialis pulses.  Chest: CTAB, no wheeze, rhonchi or crackles.  Normal respiratory effort.  Good air movement. Abd: Soft.  Obese. NTND. BS  present.  No masses palpated. No hepatosplenomegaly. No rebound tenderness or guarding. Skin: No rashes, purpura or petechiae. Warm and well-perfused. Skin intact. Neuro/Msk:  Normal gait. PERLA. EOMi. Alert. Oriented x3.  Cranial nerves II through XII intact. Muscle strength 5/5 upper/lower extremity. DTRs equal bilaterally. Psych: Normal affect, dress and demeanor. Normal speech. Normal thought content and judgment.   No exam data present  Assessment/plan: Melissa Gray is a 45 y.o. female present for CPE-labs up-to-date Breast cancer screening by mammogram - MM 3D SCREEN BREAST BILATERAL; Future Routine general medical examination at a health care facility Patient was encouraged to exercise greater than 150 minutes a week. Patient was encouraged to choose a diet filled with fresh fruits and vegetables, and lean meats. AVS provided to patient today for education/recommendation on gender specific health and safety maintenance. Colonoscopy: No FHX, routine screening at age 73 Mammogram: [place] present aunt was positive for BRCA. Completed 02/2018>ordered Cervical cancer screening:01/2018, no abnl pap. Dr. Quincy Simmonds Immunizations: 2014 tdap UTD, flu UTD. PNA UTD, covid series x3  Infectious disease screening: HIV and Hep C completed 2007 DEXA: routine screen at 77.   Return in about 1 year (around 03/28/2021) for CPE (30 min).   Orders Placed This Encounter  Procedures  . MM 3D SCREEN BREAST BILATERAL   No orders of the defined types were placed in this encounter.  Referral Orders  No referral(s) requested today     Electronically signed by: Howard Pouch, [place]

## 2020-03-28 NOTE — Patient Instructions (Signed)
Health Maintenance, Female Adopting a healthy lifestyle and getting preventive care are important in promoting health and wellness. Ask your health care provider about:  The right schedule for you to have regular tests and exams.  Things you can do on your own to prevent diseases and keep yourself healthy. What should I know about diet, weight, and exercise? Eat a healthy diet  Eat a diet that includes plenty of vegetables, fruits, low-fat dairy products, and lean protein.  Do not eat a lot of foods that are high in solid fats, added sugars, or sodium.   Maintain a healthy weight Body mass index (BMI) is used to identify weight problems. It estimates body fat based on height and weight. Your health care provider can help determine your BMI and help you achieve or maintain a healthy weight. Get regular exercise Get regular exercise. This is one of the most important things you can do for your health. Most adults should:  Exercise for at least 150 minutes each week. The exercise should increase your heart rate and make you sweat (moderate-intensity exercise).  Do strengthening exercises at least twice a week. This is in addition to the moderate-intensity exercise.  Spend less time sitting. Even light physical activity can be beneficial. Watch cholesterol and blood lipids Have your blood tested for lipids and cholesterol at 43 years of age, then have this test every 5 years. Have your cholesterol levels checked more often if:  Your lipid or cholesterol levels are high.  You are older than 43 years of age.  You are at high risk for heart disease. What should I know about cancer screening? Depending on your health history and family history, you may need to have cancer screening at various ages. This may include screening for:  Breast cancer.  Cervical cancer.  Colorectal cancer.  Skin cancer.  Lung cancer. What should I know about heart disease, diabetes, and high blood  pressure? Blood pressure and heart disease  High blood pressure causes heart disease and increases the risk of stroke. This is more likely to develop in people who have high blood pressure readings, are of African descent, or are overweight.  Have your blood pressure checked: ? Every 3-5 years if you are 18-39 years of age. ? Every year if you are 40 years old or older. Diabetes Have regular diabetes screenings. This checks your fasting blood sugar level. Have the screening done:  Once every three years after age 40 if you are at a normal weight and have a low risk for diabetes.  More often and at a younger age if you are overweight or have a high risk for diabetes. What should I know about preventing infection? Hepatitis B If you have a higher risk for hepatitis B, you should be screened for this virus. Talk with your health care provider to find out if you are at risk for hepatitis B infection. Hepatitis C Testing is recommended for:  Everyone born from 1945 through 1965.  Anyone with known risk factors for hepatitis C. Sexually transmitted infections (STIs)  Get screened for STIs, including gonorrhea and chlamydia, if: ? You are sexually active and are younger than 43 years of age. ? You are older than 43 years of age and your health care provider tells you that you are at risk for this type of infection. ? Your sexual activity has changed since you were last screened, and you are at increased risk for chlamydia or gonorrhea. Ask your health care provider   if you are at risk.  Ask your health care provider about whether you are at high risk for HIV. Your health care provider may recommend a prescription medicine to help prevent HIV infection. If you choose to take medicine to prevent HIV, you should first get tested for HIV. You should then be tested every 3 months for as long as you are taking the medicine. Pregnancy  If you are about to stop having your period (premenopausal) and  you may become pregnant, seek counseling before you get pregnant.  Take 400 to 800 micrograms (mcg) of folic acid every day if you become pregnant.  Ask for birth control (contraception) if you want to prevent pregnancy. Osteoporosis and menopause Osteoporosis is a disease in which the bones lose minerals and strength with aging. This can result in bone fractures. If you are 65 years old or older, or if you are at risk for osteoporosis and fractures, ask your health care provider if you should:  Be screened for bone loss.  Take a calcium or vitamin D supplement to lower your risk of fractures.  Be given hormone replacement therapy (HRT) to treat symptoms of menopause. Follow these instructions at home: Lifestyle  Do not use any products that contain nicotine or tobacco, such as cigarettes, e-cigarettes, and chewing tobacco. If you need help quitting, ask your health care provider.  Do not use street drugs.  Do not share needles.  Ask your health care provider for help if you need support or information about quitting drugs. Alcohol use  Do not drink alcohol if: ? Your health care provider tells you not to drink. ? You are pregnant, may be pregnant, or are planning to become pregnant.  If you drink alcohol: ? Limit how much you use to 0-1 drink a day. ? Limit intake if you are breastfeeding.  Be aware of how much alcohol is in your drink. In the [place] one drink equals one 12 oz bottle of beer (355 mL), one 5 oz glass of wine (148 mL), or one 1 oz glass of hard liquor (44 mL). General instructions  Schedule regular health, dental, and eye exams.  Stay current with your vaccines.  Tell your health care provider if: ? You often feel depressed. ? You have ever been abused or do not feel safe at home. Summary  Adopting a healthy lifestyle and getting preventive care are important in promoting health and wellness.  Follow your health care provider's instructions about healthy  diet, exercising, and getting tested or screened for diseases.  Follow your health care provider's instructions on monitoring your cholesterol and blood pressure. This information is not intended to replace advice given to you by your health care provider. Make sure you discuss any questions you have with your health care provider. Document Revised: 01/04/2018 Document Reviewed: 01/04/2018 Elsevier Patient Education  2021 Elsevier Inc.  

## 2020-04-11 LAB — HM DIABETES EYE EXAM

## 2020-04-16 ENCOUNTER — Other Ambulatory Visit: Payer: Self-pay

## 2020-04-16 ENCOUNTER — Encounter: Payer: Self-pay | Admitting: Internal Medicine

## 2020-04-16 ENCOUNTER — Ambulatory Visit: Payer: BC Managed Care – PPO | Admitting: Internal Medicine

## 2020-04-16 VITALS — BP 128/88 | HR 104 | Ht 65.25 in | Wt 226.2 lb

## 2020-04-16 DIAGNOSIS — E1165 Type 2 diabetes mellitus with hyperglycemia: Secondary | ICD-10-CM

## 2020-04-16 DIAGNOSIS — E039 Hypothyroidism, unspecified: Secondary | ICD-10-CM | POA: Diagnosis not present

## 2020-04-16 MED ORDER — FREESTYLE LIBRE 2 READER DEVI: 1.0000 | Freq: Every day | 0 refills | Status: DC

## 2020-04-16 MED ORDER — OZEMPIC (0.25 OR 0.5 MG/DOSE) 2 MG/1.5ML ~~LOC~~ SOPN: PEN_INJECTOR | SUBCUTANEOUS | 3 refills | Status: DC

## 2020-04-16 MED ORDER — FREESTYLE LIBRE 2 SENSOR MISC: 1.0000 | 3 refills | Status: DC

## 2020-04-16 NOTE — Patient Instructions (Addendum)
Please stop Liothyronine for now.  STOP SWEET TEA!!!  Please start Ozempic 0.25 mg weekly in a.m. (for example on Sunday morning) x 4 weeks, then increase to 0.5 mg weekly in a.m. if no nausea or hypoglycemia.  Stop: - Rybelsus - Januvia  Continue: - Faxiga 10 mg before b'fast - Glimepiride 8 mg before b'fast  Please return in 1.5 months with your sugar log.   Please let me know if the sugars are consistently <80 or >200.  PATIENT INSTRUCTIONS FOR TYPE 2 DIABETES:  **Please join MyChart!** - see attached instructions about how to join if you have not done so already.  DIET AND EXERCISE Diet and exercise is an important part of diabetic treatment.  We recommended aerobic exercise in the form of brisk walking (working between 40-60% of maximal aerobic capacity, similar to brisk walking) for 150 minutes per week (such as 30 minutes five days per week) along with 3 times per week performing 'resistance' training (using various gauge rubber tubes with handles) 5-10 exercises involving the major muscle groups (upper body, lower body and core) performing 10-15 repetitions (or near fatigue) each exercise. Start at half the above goal but build slowly to reach the above goals. If limited by weight, joint pain, or disability, we recommend daily walking in a swimming pool with water up to waist to reduce pressure from joints while allow for adequate exercise.    BLOOD GLUCOSES Monitoring your blood glucoses is important for continued management of your diabetes. Please check your blood glucoses 2-4 times a day: fasting, before meals and at bedtime (you can rotate these measurements - e.g. one day check before the 3 meals, the next day check before 2 of the meals and before bedtime, etc.).   HYPOGLYCEMIA (low blood sugar) Hypoglycemia is usually a reaction to not eating, exercising, or taking too much insulin/ other diabetes drugs.  Symptoms include tremors, sweating, hunger, confusion, headache,  etc. Treat IMMEDIATELY with 15 grams of Carbs: . 4 glucose tablets .  cup regular juice/soda . 2 tablespoons raisins . 4 teaspoons sugar . 1 tablespoon honey Recheck blood glucose in 15 mins and repeat above if still symptomatic/blood glucose <100.  RECOMMENDATIONS TO REDUCE YOUR RISK OF DIABETIC COMPLICATIONS: * Take your prescribed MEDICATION(S) * Follow a DIABETIC diet: Complex carbs, fiber rich foods, (monounsaturated and polyunsaturated) fats * AVOID saturated/trans fats, high fat foods, >2,300 mg salt per day. * EXERCISE at least 5 times a week for 30 minutes or preferably daily.  * DO NOT SMOKE OR DRINK more than 1 drink a day. * Check your FEET every day. Do not wear tightfitting shoes. Contact us if you develop an ulcer * See your EYE doctor once a year or more if needed * Get a FLU shot once a year * Get a PNEUMONIA vaccine once before and once after age 68 years  GOALS:  * Your Hemoglobin A1c of <7%  * fasting sugars need to be <130 * after meals sugars need to be <180 (2h after you start eating) * Your Systolic BP should be 850 or lower  * Your Diastolic BP should be 80 or lower  * Your HDL (Good Cholesterol) should be 40 or higher  * Your LDL (Bad Cholesterol) should be 100 or lower. * Your Triglycerides should be 150 or lower  * Your Urine microalbumin (kidney function) should be <30 * Your Body Mass Index should be 25 or lower    Please consider the following ways to  cut down carbs and fat and increase fiber and micronutrients in your diet: - substitute whole grain for white bread or pasta - substitute brown rice for white rice - substitute 90-calorie flat bread pieces for slices of bread when possible - substitute sweet potatoes or yams for white potatoes - substitute humus for margarine - substitute tofu for cheese when possible - substitute almond or rice milk for regular milk (would not drink soy milk daily due to concern for soy estrogen influence on  breast cancer risk) - substitute dark chocolate for other sweets when possible - substitute water - can add lemon or orange slices for taste - for diet sodas (artificial sweeteners will trick your body that you can eat sweets without getting calories and will lead you to overeating and weight gain in the long run) - do not skip breakfast or other meals (this will slow down the metabolism and will result in more weight gain over time)  - can try smoothies made from fruit and almond/rice milk in am instead of regular breakfast - can also try old-fashioned (not instant) oatmeal made with almond/rice milk in am - order the dressing on the side when eating salad at a restaurant (pour less than half of the dressing on the salad) - eat as little meat as possible - can try juicing, but should not forget that juicing will get rid of the fiber, so would alternate with eating raw veg./fruits or drinking smoothies - use as little oil as possible, even when using olive oil - can dress a salad with a mix of balsamic vinegar and lemon juice, for e.g. - use agave nectar, stevia sugar, or regular sugar rather than artificial sweateners - steam or broil/roast veggies  - snack on veggies/fruit/nuts (unsalted, preferably) when possible, rather than processed foods - reduce or eliminate aspartame in diet (it is in diet sodas, chewing gum, etc) Read the labels!  Try to read Dr. Janene Harvey book: "Program for Reversing Diabetes" for other ideas for healthy eating.

## 2020-04-16 NOTE — Progress Notes (Signed)
Patient ID: Melissa Gray, female   DOB: [birth year], 45 y.o.   MRN: [id number]   This visit occurred during the SARS-CoV-2 public health emergency.  Safety protocols were in place, including screening questions prior to the visit, additional usage of staff PPE, and extensive cleaning of exam room while observing appropriate contact time as indicated for disinfecting solutions.   HPI: Melissa Gray is a 45 y.o.-year-old female, referred by her PCP, Dr. Raoul Pitch, for management of DM2, dx in 2016, non-insulin-dependent, uncontrolled, without long-term complications but with hyperglycemia.  DM2: Reviewed HbA1c: Lab Results  Component Value Date   HGBA1C 9.1 (A) 03/12/2020   HGBA1C 9.1 03/12/2020   HGBA1C 9.1 (A) 03/12/2020   HGBA1C 9.1 (A) 03/12/2020   HGBA1C 8.7 (A) 12/12/2019   HGBA1C 8.7 12/12/2019   HGBA1C 8.7 (A) 12/12/2019   HGBA1C 8.7 (A) 12/12/2019   HGBA1C 8.7 (A) 09/11/2019   HGBA1C 8.7 09/11/2019   HGBA1C 8.7 (A) 09/11/2019   HGBA1C 8.7 (A) 09/11/2019   Pt is on a regimen of: - Glimepiride 8 mg in a.m. before breakfast - Rybelsus 14 mg in a.m. before breakfast - not consistently - Januvia 100 mg in the a.m. before breakfast  - Farxiga 10 mg in the am before b'fast - Colesevelam 625 mg 2x a day - L and D Tried Victoza >> pain at injection site - several years ago.  Pt is not checking sugars. - am: n/c - 2h after b'fast: n/c - before lunch: n/c - 2h after lunch: n/c - before dinner: n/c - 2h after dinner: n/c - bedtime: n/c - nighttime: n/c Lowest sugar was ; she has hypoglycemia awareness at 70.  Highest sugar was 300.  Glucometer: ?  Pt's meals are: (now [employer]) - Breakfast: 2 eggs with veggies or bagel w/ cream cheese or cereal, or skips - Lunch: sandwich - Dinner: takeout 1-2x a week, homecooked: meat + veggies or salad + starch - Snacks: 1-2: icecream - maybe once a week (decreased cravings with Adderrall) SHE DRINKS SWEET TEA EVERY DAY! She  previously saw nutrition.  - no CKD, last BUN/creatinine:  Lab Results  Component Value Date   BUN 12 03/12/2020   BUN 12 12/12/2019   CREATININE 0.75 03/12/2020   CREATININE 0.65 12/12/2019  She is not on ARB or ACE inhibitor.  -+ HL; last set of lipids: Lab Results  Component Value Date   CHOL 135 03/12/2020   HDL 25.20 (L) 03/12/2020   LDLDIRECT 69.0 03/12/2020   TRIG 366.0 (H) 03/12/2020   CHOLHDL 5 03/12/2020  On Lipitor 40 mg daily, micronized fenofibrate 134 mg daily, fish oil, colesevelam 625 mg twice a day.  - last eye exam was on 04/11/2020: No DR   - no numbness and tingling in her feet.  Platelets with exam 05/30/2019.  Pt has FH of DM in mother, father, maternal grandmother, brother.  She also has a history of hypothyroidism:  Patient describes that she was previously on Synthroid for several years.  She could not tolerate generic levothyroxine due to facial rash and tingling.  However, her levothyroxine requirements decreased and she was able to come off.  She was off the hormone in 11/2019 when TSH was slightly high.  Repeat TSH 3 months later was still slightly high.  At that time, she was started on liothyronine by PCP.  He does not feel differently afterwards.  She is currently on liothyronine 10 mcg in a.m. - started 2 weeks ago by PCP: -  in am - fasting - at least 30 min from b'fast - no calcium - no iron - + multivitamins in the evening - no PPIs now - not on Biotin  Reviewed her TSH levels: Lab Results  Component Value Date   TSH 4.57 (H) 03/12/2020   TSH 4.95 (H) 12/12/2019   TSH 2.25 02/02/2019   TSH 3.64 06/30/2018   TSH 2.79 08/31/2017   TSH 3.82 06/03/2015   No results found for: THGAB   Pt denies: - feeling nodules in neck - hoarseness - dysphagia - choking - SOB with lying down  She also has a history of GERD, IBS, obesity, gallstones - status post cholecystectomy, depression.  ROS: Constitutional: no weight gain, + weight  loss, + fatigue, + hot flashes, no subjective hypothermia, no nocturia, + excessive urination Eyes: no blurry vision, no xerophthalmia ENT: no sore throat, + see HPI, no tinnitus, no hypoacusis Cardiovascular: no CP, no SOB, no palpitations, no leg swelling Respiratory: no cough, no SOB, no wheezing Gastrointestinal: + All: Nausea, vomiting, diarrhea, constipation, acid reflux Musculoskeletal: no muscle, no joint aches Skin: no rash, no hair loss, + itching Neurological: no tremors, no numbness or tingling/no dizziness/+ HAs Psychiatric: no depression, no anxiety + Low libido, + dysmenorrhea  Past Medical History:  Diagnosis Date  . Anxiety   . COVID-19 07/2018  . Depression   . Diabetes mellitus without complication ([place])   . Elevated cholesterol   . Gallstones   . GERD (gastroesophageal reflux disease)   . Hematochezia 10/2017   Plan for colonoscopy  . Hyperplastic colon polyp 11/2017  . IBS (irritable bowel syndrome)    diarrhea predominant  . Infertility, female   . Obesity   . Thyroid disease 2004   treated for years, stopped 2014 per new MD   Past Surgical History:  Procedure Laterality Date  . CHOLECYSTECTOMY  2006  . CYST EXCISION    . GLOMUS TUMOR EXCISION  2005, 2012   finger and cheek   Social History   Socioeconomic History  . Marital status: Married    Spouse name: Not on file  . Number of children: 0  . Years of education: Not on file  . Highest education level: Not on file  Occupational History  . Occupation: Presenter, broadcasting  Tobacco Use  . Smoking status: Never Smoker  . Smokeless tobacco: Never Used  Vaping Use  . Vaping Use: Never used  Substance and Sexual Activity  . Alcohol use: Yes    Alcohol/week: 0.0 standard drinks    Comment: ocassionally  . Drug use: No  . Sexual activity: Yes    Partners: Male    Birth control/protection: None  Other Topics Concern  . Not on file  Social History Narrative   Married. Husband's name is  Audelia Acton. No children.   2 pets.   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   Drinks caffeine   Wears her seatbelt   Smoke detector in home   Safe in her relationship.   Social Determinants of Health   Financial Resource Strain: Not on file  Food Insecurity: Not on file  Transportation Needs: Not on file  Physical Activity: Not on file  Stress: Not on file  Social Connections: Not on file  Intimate Partner Violence: Not on file   Current Outpatient Medications on File Prior to Visit  Medication Sig Dispense Refill  . amphetamine-dextroamphetamine (ADDERALL) 10 MG tablet 5 mg PO BID 90 tablet 0  . atorvastatin (LIPITOR)  40 MG tablet Take 1 tablet (40 mg total) by mouth daily. 90 tablet 3  . colesevelam (WELCHOL) 625 MG tablet Take 2 tablets (1,250 mg total) by mouth 2 (two) times daily with a meal. 120 tablet 5  . dapagliflozin propanediol (FARXIGA) 10 MG TABS tablet Take 1 tablet (10 mg total) by mouth daily before breakfast. 90 tablet 1  . fenofibrate micronized (LOFIBRA) 134 MG capsule Take 134 mg by mouth daily.    Marland Kitchen glimepiride (AMARYL) 4 MG tablet Take 2 tablets (8 mg total) by mouth daily with breakfast. 180 tablet 1  . liothyronine (CYTOMEL) 5 MCG tablet Take 2 tablets (10 mcg total) by mouth daily. 180 tablet 1  . Multiple Vitamin (MULTIVITAMIN ADULT PO) Take by mouth.    . Omega-3 Fatty Acids (FISH OIL PO) Take 3,000 mg by mouth.    . Semaglutide (RYBELSUS) 14 MG TABS Take 1 tablet by mouth daily. 90 tablet 1  . sitaGLIPtin (JANUVIA) 100 MG tablet Take 1 tablet (100 mg total) by mouth daily. 90 tablet 1  . traZODone (DESYREL) 50 MG tablet Take 0.5 tablets (25 mg total) by mouth at bedtime as needed for sleep. Start 1/2 tab QHS, increase by 1/2 tab every 3 nights if needed only. Max dose 2 tabs (100 mg) 45 tablet 1  . venlafaxine XR (EFFEXOR XR) 75 MG 24 hr capsule Take 3 capsules (225 mg total) by mouth daily with breakfast. 270 capsule 1   No current  facility-administered medications on file prior to visit.   Allergies  Allergen Reactions  . Tape Other (See Comments)    Adhesive tape causes blisters  . Levothyroxine Rash    Generic brand only caused rash   Family History  Problem Relation Age of Onset  . Diabetes Mother   . COPD Father   . Heart disease Father   . Early death Father   . Diabetes Brother   . Diabetes Maternal Grandmother   . Hearing loss Maternal Grandmother   . Dementia Maternal Grandmother   . Early death Maternal Grandfather   . Cancer Maternal Grandfather   . Breast cancer Paternal Grandmother   . Heart disease Paternal Grandfather   . Breast cancer Paternal Aunt   . Heart disease Paternal Uncle   . Heart attack Paternal Uncle   . Breast cancer Paternal Aunt   . Alcohol abuse Paternal Aunt   . Alzheimer's disease Paternal Aunt   . Colon cancer Neg Hx   . Esophageal cancer Neg Hx   . Rectal cancer Neg Hx   . Stomach cancer Neg Hx     PE: BP 128/88 (BP Location: Right Arm, Patient Position: Sitting, Cuff Size: Normal)   Pulse (!) 104   Ht 5' 5.25" (1.657 m)   Wt 226 lb 3.2 oz (102.6 kg)   SpO2 96%   BMI 37.35 kg/m  Wt Readings from Last 3 Encounters:  04/16/20 226 lb 3.2 oz (102.6 kg)  03/28/20 223 lb (101.2 kg)  03/12/20 227 lb (103 kg)   Constitutional: overweight, in NAD Eyes: PERRLA, EOMI, no exophthalmos ENT: moist mucous membranes, no thyromegaly, no cervical lymphadenopathy Cardiovascular: RRR, No MRG Respiratory: CTA B Gastrointestinal: abdomen soft, NT, ND, BS+ Musculoskeletal: no deformities, strength intact in all 4 Skin: moist, warm, no rashes Neurological: no tremor with outstretched hands, DTR normal in all 4  ASSESSMENT: 1. DM2, non-insulin-dependent, uncontrolled, without long-term complications, but with hyperglycemia  No family history of medullary thyroid cancer or personal history of  pancreatitis.  2.  Hypothyroidism  PLAN:  1. Patient with long-standing,  uncontrolled diabetes, on oral antidiabetic regimen, which became insufficient.  Latest HbA1c was 9.1% on 03/12/2020.  At this visit, she is not checking blood sugars.  We discussed about the importance of starting to check to understand her diabetes.  I am hoping that her insurance will cover the CGM.  I explained how this works.  She agrees to try a freestyle libre CGM.  -She tells me that she is drinking sweet tea every day.  I explained that we cannot get her diabetes under control unless she stops this. -At today's visit, I suggested to stop Januvia and Rybelsus and start her on Ozempic.  Discussed about benefits and possible side effects.  We will start at a low dose and increase as tolerated.  She feels that the once a week injection would be of much benefit for her than once a day injection, as with Victoza in the past.  We will continue the [place] and the glimepiride.  However, I explained that if her sugars improved, we will eliminate glimepiride next. - I suggested to:  Patient Instructions  Please stop Liothyronine for now.  STOP SWEET TEA!!!  Please start Ozempic 0.25 mg weekly in a.m. (for example on Sunday morning) x 4 weeks, then increase to 0.5 mg weekly in a.m. if no nausea or hypoglycemia.  Stop: - Rybelsus - Januvia  Continue: - Faxiga 10 mg before b'fast - Glimepiride 8 mg before b'fast  Please return in 1.5 months with your sugar log.   Please let me know if the sugars are consistently <80 or >200.  - Strongly advised her to start checking sugars at different times of the day - check 1-2x a day, rotating checks - discussed about CBG targets for treatment: 80-130 mg/dL before meals and <180 mg/dL after meals; target HbA1c <7%. - given sugar log and advised how to fill it and to bring it at next appt  - given foot care handout and explained the principles  - given instructions for hypoglycemia management "15-15 rule"  - advised for yearly eye exams  - Return to clinic  in 3 mo with sugar log   2.  Hypothyroidism -Previously on treatment with Synthroid d.a.w.  For several years.  However, she could come off afterwards. -2 weeks ago she was started on liothyronine 10 mcg daily after a TSH returned slightly high x2: Latest TSH from 03/12/2020 was slightly high, at 4.57, previously 4.95 -We discussed that this is the equivalent of 40 mcg daily of levothyroxine -She did not feel different after starting Cytomel. -At today's visit, we discussed that recommended treatment for hypothyroidism is actually with levothyroxine, rather than liothyronine.  Too much liothyronine is nonphysiologic and can have negative side effects: Arrhythmia, hypercoagulability, osteoporosis long-term. -For now, we decided to  Philemon Kingdom, MD PhD [facility]

## 2020-04-21 ENCOUNTER — Encounter: Payer: Self-pay | Admitting: Internal Medicine

## 2020-06-04 ENCOUNTER — Encounter: Payer: Self-pay | Admitting: Internal Medicine

## 2020-06-04 ENCOUNTER — Ambulatory Visit: Payer: BC Managed Care – PPO | Admitting: Internal Medicine

## 2020-06-04 ENCOUNTER — Other Ambulatory Visit: Payer: Self-pay

## 2020-06-04 VITALS — BP 150/88 | HR 89 | Ht 62.25 in | Wt 223.2 lb

## 2020-06-04 DIAGNOSIS — E1165 Type 2 diabetes mellitus with hyperglycemia: Secondary | ICD-10-CM

## 2020-06-04 DIAGNOSIS — E039 Hypothyroidism, unspecified: Secondary | ICD-10-CM | POA: Diagnosis not present

## 2020-06-04 LAB — POCT GLYCOSYLATED HEMOGLOBIN (HGB A1C): Hemoglobin A1C: 6.6 % — AB (ref 4.0–5.6)

## 2020-06-04 LAB — T4, FREE: Free T4: 0.94 ng/dL (ref 0.60–1.60)

## 2020-06-04 LAB — T3, FREE: T3, Free: 3.2 pg/mL (ref 2.3–4.2)

## 2020-06-04 LAB — TSH: TSH: 3.04 u[IU]/mL (ref 0.35–4.50)

## 2020-06-04 NOTE — Progress Notes (Signed)
Patient ID: Melissa Gray, female   DOB: Jan 05, 1976, 45 y.o.   MRN: [id number]   This visit occurred during the SARS-CoV-2 public health emergency.  Safety protocols were in place, including screening questions prior to the visit, additional usage of staff PPE, and extensive cleaning of exam room while observing appropriate contact time as indicated for disinfecting solutions.   HPI: Melissa Gray is a 45 y.o.-year-old female, initially referred by her PCP, Dr. Raoul Pitch, returning for f/u for DM2, dx in 2016, non-insulin-dependent, uncontrolled, without long-term complications but with hyperglycemia. Last OV 1.5 mo ago.  Interim hx: She was able to start Ozempic since last visit.  She tolerates it well.   She also decreased intake of sweet tea, milkshakes, and also reduced starches. Sugars improved.  DM2: Reviewed HbA1c: Lab Results  Component Value Date   HGBA1C 9.1 (A) 03/12/2020   HGBA1C 9.1 03/12/2020   HGBA1C 9.1 (A) 03/12/2020   HGBA1C 9.1 (A) 03/12/2020   HGBA1C 8.7 (A) 12/12/2019   HGBA1C 8.7 12/12/2019   HGBA1C 8.7 (A) 12/12/2019   HGBA1C 8.7 (A) 12/12/2019   HGBA1C 8.7 (A) 09/11/2019   HGBA1C 8.7 09/11/2019   HGBA1C 8.7 (A) 09/11/2019   HGBA1C 8.7 (A) 09/11/2019   Pt is on a regimen of: - Glimepiride 8 mg in a.m. before breakfast  >> now Ozempic 0.5 mg weekly -started 03/2020 - Farxiga 10 mg in the am before b'fast - Colesevelam 625 mg 2x a day - L and D Tried Victoza >> pain at injection site - several years ago.  Pt is checking sugars 4x a day with her new CGM [facility]):   Lowest sugar was 70s; she has hypoglycemia awareness at 70.  Highest sugar was 300 >> 260 (milkshakes).  Pt's meals are: (now [employer]) - Breakfast: 2 eggs with veggies or bagel w/ cream cheese or cereal, or skips - Lunch: sandwich - Dinner: takeout 1-2x a week, homecooked: meat + veggies or salad + starch - Snacks: 1-2: icecream - maybe once a week (decreased cravings with  Adderrall) She previously saw nutrition.  - no CKD, last BUN/creatinine:  Lab Results  Component Value Date   BUN 12 03/12/2020   BUN 12 12/12/2019   CREATININE 0.75 03/12/2020   CREATININE 0.65 12/12/2019  She is not on ARB or ACE inhibitor.  -+ HL; last set of lipids: Lab Results  Component Value Date   CHOL 135 03/12/2020   HDL 25.20 (L) 03/12/2020   LDLDIRECT 69.0 03/12/2020   TRIG 366.0 (H) 03/12/2020   CHOLHDL 5 03/12/2020  On Lipitor 40 mg daily, micronized fenofibrate 134 mg daily, fish oil, colesevelam 625 mg twice a day.  - last eye exam was on 04/11/2020: No DR   - no numbness and tingling in her feet.  Platelets with exam 05/30/2019.  Pt has FH of DM in mother, father, maternal grandmother, brother.  She also has a history of hypothyroidism:  Patient describes that she was previously on Synthroid for several years.  She could not tolerate generic levothyroxine due to facial rash and tingling.  However, her levothyroxine requirements decreased and she was able to come off.  She was off the hormone in 11/2019 when TSH was slightly high.  Repeat TSH 3 months later was still slightly high.  At that time, she was started on liothyronine by PCP.  He does not feel differently afterwards.  At last visit she was on liothyronine 10 mcg in the morning.  We stopped this.  Reviewed her TSH levels: Lab Results  Component Value Date   TSH 4.57 (H) 03/12/2020   TSH 4.95 (H) 12/12/2019   TSH 2.25 02/02/2019   TSH 3.64 06/30/2018   TSH 2.79 08/31/2017   TSH 3.82 06/03/2015   No results found for: THGAB   Pt denies: - feeling nodules in neck - hoarseness - dysphagia - choking - SOB with lying down  She also has a history of GERD, IBS, obesity, gallstones - status post cholecystectomy, depression.  ROS: Constitutional: no weight gain, no weight loss, + fatigue, + hot flashes Eyes: no blurry vision, no xerophthalmia ENT: no sore throat, + see HPI, no tinnitus, no  hypoacusis Cardiovascular: no CP, no SOB, no palpitations, no leg swelling Respiratory: no cough, no SOB, no wheezing Gastrointestinal: No nausea, vomiting, diarrhea, constipation Musculoskeletal: no muscle, no joint aches Skin: no rash, no hair loss Neurological: no tremors, no numbness or tingling/no dizziness  Past Medical History:  Diagnosis Date  . Anxiety   . COVID-19 07/2018  . Depression   . Diabetes mellitus without complication ([place])   . Elevated cholesterol   . Gallstones   . GERD (gastroesophageal reflux disease)   . Hematochezia 10/2017   Plan for colonoscopy  . Hyperplastic colon polyp 11/2017  . IBS (irritable bowel syndrome)    diarrhea predominant  . Infertility, female   . Obesity   . Thyroid disease 2004   treated for years, stopped 2014 per new MD   Past Surgical History:  Procedure Laterality Date  . CHOLECYSTECTOMY  2006  . CYST EXCISION    . GLOMUS TUMOR EXCISION  2005, 2012   finger and cheek   Social History   Socioeconomic History  . Marital status: Married    Spouse name: Not on file  . Number of children: 0  . Years of education: Not on file  . Highest education level: Not on file  Occupational History  . Occupation: Fish farm manager  Tobacco Use  . Smoking status: Never Smoker  . Smokeless tobacco: Never Used  . Tobacco comment: My dad smoked like a chimney when I was growing up  Vaping Use  . Vaping Use: Never used  Substance and Sexual Activity  . Alcohol use: Yes    Alcohol/week: 0.0 standard drinks    Comment: Maybe once a month or less; only socially  . Drug use: Not Currently    Types: Marijuana  . Sexual activity: Yes    Partners: Male    Birth control/protection: None  Other Topics Concern  . Not on file  Social History Narrative   Married. Husband's name is Audelia Acton. No children.   2 pets.   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   Drinks caffeine   Wears her seatbelt   Smoke detector in home    Safe in her relationship.   Social Determinants of Health   Financial Resource Strain: Not on file  Food Insecurity: Not on file  Transportation Needs: Not on file  Physical Activity: Not on file  Stress: Not on file  Social Connections: Not on file  Intimate Partner Violence: Not on file   Current Outpatient Medications on File Prior to Visit  Medication Sig Dispense Refill  . amphetamine-dextroamphetamine (ADDERALL) 10 MG tablet 5 mg PO BID 90 tablet 0  . atorvastatin (LIPITOR) 40 MG tablet Take 1 tablet (40 mg total) by mouth daily. 90 tablet 3  . colesevelam (WELCHOL) 625 MG tablet Take 2 tablets (1,250 mg total)  by mouth 2 (two) times daily with a meal. 120 tablet 5  . Continuous Blood Gluc Receiver (FREESTYLE LIBRE 2 READER) DEVI 1 each by Does not apply route daily. 1 each 0  . Continuous Blood Gluc Sensor (FREESTYLE LIBRE 2 SENSOR) MISC 1 each by Does not apply route every 14 (fourteen) days. 6 each 3  . dapagliflozin propanediol (FARXIGA) 10 MG TABS tablet Take 1 tablet (10 mg total) by mouth daily before breakfast. 90 tablet 1  . fenofibrate micronized (LOFIBRA) 134 MG capsule Take 134 mg by mouth daily.    Marland Kitchen glimepiride (AMARYL) 4 MG tablet Take 2 tablets (8 mg total) by mouth daily with breakfast. 180 tablet 1  . Multiple Vitamin (MULTIVITAMIN ADULT PO) Take by mouth.    . Omega-3 Fatty Acids (FISH OIL PO) Take 3,000 mg by mouth.    . Semaglutide,0.25 or 0.5MG /DOS, (OZEMPIC, 0.25 OR 0.5 MG/DOSE,) 2 MG/1.5ML SOPN Inject 0.5 mg weekly under skin 4.5 mL 3  . traZODone (DESYREL) 50 MG tablet Take 0.5 tablets (25 mg total) by mouth at bedtime as needed for sleep. Start 1/2 tab QHS, increase by 1/2 tab every 3 nights if needed only. Max dose 2 tabs (100 mg) 45 tablet 1  . venlafaxine XR (EFFEXOR XR) 75 MG 24 hr capsule Take 3 capsules (225 mg total) by mouth daily with breakfast. 270 capsule 1   No current facility-administered medications on file prior to visit.   Allergies   Allergen Reactions  . Tape Other (See Comments)    Adhesive tape causes blisters  . Levothyroxine Rash    Generic brand only caused rash   Family History  Problem Relation Age of Onset  . Diabetes Mother   . Depression Mother   . Obesity Mother   . COPD Father   . Heart disease Father   . Early death Father   . Diabetes Father   . Obesity Father   . Diabetes Brother   . Diabetes Maternal Grandmother   . Hearing loss Maternal Grandmother   . Dementia Maternal Grandmother   . Early death Maternal Grandfather   . Cancer Maternal Grandfather   . Breast cancer Paternal Grandmother   . Cancer Paternal Grandmother   . Early death Paternal Grandmother   . Heart disease Paternal Grandfather   . Breast cancer Paternal Aunt   . Heart disease Paternal Uncle   . Heart attack Paternal Uncle   . Breast cancer Paternal Aunt   . Alcohol abuse Paternal Aunt   . Alzheimer's disease Paternal Aunt   . Alcohol abuse Paternal Aunt   . Cancer Maternal Aunt   . Obesity Maternal Aunt   . Colon cancer Neg Hx   . Esophageal cancer Neg Hx   . Rectal cancer Neg Hx   . Stomach cancer Neg Hx     PE: BP (!) 150/88 (BP Location: Right Arm, Patient Position: Sitting, Cuff Size: Normal)   Pulse 89   Ht 5' 2.25" (1.581 m)   Wt 223 lb 3.2 oz (101.2 kg)   SpO2 97%   BMI 40.50 kg/m  Wt Readings from Last 3 Encounters:  06/04/20 223 lb 3.2 oz (101.2 kg)  04/16/20 226 lb 3.2 oz (102.6 kg)  03/28/20 223 lb (101.2 kg)   Constitutional: overweight, in NAD Eyes: PERRLA, EOMI, no exophthalmos ENT: moist mucous membranes, no thyromegaly, no cervical lymphadenopathy Cardiovascular: RRR, No MRG, + L LE swelling - chronic Respiratory: CTA B Gastrointestinal: abdomen soft, NT, ND, BS+ Musculoskeletal:  no deformities, strength intact in all 4 Skin: moist, warm, no rashes Neurological: + mild tremor with outstretched hands, DTR normal in all 4  ASSESSMENT: 1. DM2, non-insulin-dependent, uncontrolled,  without long-term complications, but with hyperglycemia  No family history of medullary thyroid cancer or personal history of pancreatitis.  2.  Hypothyroidism  3. HL  PLAN:  1. Patient with longstanding, uncontrolled, type 2 diabetes, on oral antidiabetic regimen with Wilder Glade and glimepiride and also weekly GLP-1 receptor agonist added at last visit.  At that time we started Ozempic and stopped Januvia and Rybelsus.  I advised her to start at a low dose and increase as tolerated.  Of note she used Victoza in the past and was excited about having to inject Ozempic only once a week, rather than once a day, as she had to do for Victoza. -At last visit, she was not checking sugars and I recommended a freestyle libre CGM >> she obtained this.  We also discussed about the importance of stopping sweet tea.  HbA1c was 9.1% in 02/2020. -Since last visit, she started to improve her diet by reducing sweet tea (now drinks this mostly as a treat), milkshakes, and also reducing starches.  Sugars started to improve. CGM interpretation: -At today's visit, we reviewed her CGM downloads: It appears that 72% of values are in target range (goal >70%), while 20% are higher than 180 (goal <25%), and 0% are lower than 70 (goal <4%).  The calculated average blood sugar is 163.  The projected HbA1c for the next 3 months ([facility]) is 7.2%. -Reviewing the CGM trends, it appears that sugars are controlled overnight but they increase after the first meal of the day and increase even more after dinner.  However, they appear significantly improved compared to last visit and we only started Ozempic 1.5 months ago.  Therefore, for now, I encouraged her to continue with dietary changes and try to stop sweet tea completely, but would not increase the Ozempic dose right away.  We did discuss about stopping WelChol when she ran out, especially since she had high triglycerides at last check. - I suggested to:  Patient Instructions  Stop  Colesevelam when you run out.  Please continue: - Faxiga 10 mg before b'fast - Glimepiride 8 mg before b'fast - Ozempic 0.5 mg weekly in a.m.  Please return in 3-4 months with your sugar log.   - we checked her HbA1c: 6.6% (MUCH better) - advised to check sugars at different times of the day - 4x a day, rotating check times - advised for yearly eye exams >> she is UTD - return to clinic in 3-4 months   2.  Hypothyroidism -Patient previously on Synthroid d.a.w. for several years, however, able to come off afterwards due to normalization of her TFTs.  2 weeks prior to our last visit she was started on liothyronine 10 mcg daily after her TSH returned slightly high at x2 in 02/2020: 4.95 and 4.57, respectively.  -At last visit we discussed that this liothyronine dose is the equivalent of 40 mcg of levothyroxine.  She mentions she did not feel different after starting the liothyronine. -We discussed at last visit about the standard treatment for hypothyroidism with levothyroxine if needed.  We discussed about possible side effects of liothyronine especially if used at high doses. -We decided to stop liothyronine and have her back for labs.  We will recheck this today. -If the TSH remains elevated, we may need to start treatment with levothyroxine.  3. HL - Reviewed latest lipid panel  Lab Results  Component Value Date   CHOL 135 03/12/2020   HDL 25.20 (L) 03/12/2020   LDLDIRECT 69.0 03/12/2020   TRIG 366.0 (H) 03/12/2020   CHOLHDL 5 03/12/2020  - Continues Lipitor 40, fenofibrate 134, fish oil, colesevelam 625 2x daily (however, will stop this when she runs out), without side effects  Component     Latest Ref Rng & Units 06/04/2020          TSH     0.35 - 4.50 uIU/mL 3.04  T4,Free(Direct)     0.60 - 1.60 ng/dL 0.94  Triiodothyronine,Free,Serum     2.3 - 4.2 pg/mL 3.2  Thyroid tests are normal off thyroid hormones.  Philemon Kingdom, MD PhD [facility]

## 2020-06-04 NOTE — Patient Instructions (Addendum)
Stop Colesevelam when you run out.  Please continue: - Faxiga 10 mg before b'fast - Glimepiride 8 mg before b'fast - Ozempic 0.5 mg weekly in a.m.  Please return in 3-4 months with your sugar log.

## 2020-08-25 ENCOUNTER — Ambulatory Visit: Payer: BC Managed Care – PPO | Admitting: Family Medicine

## 2020-08-25 DIAGNOSIS — E119 Type 2 diabetes mellitus without complications: Secondary | ICD-10-CM

## 2020-09-18 ENCOUNTER — Encounter: Payer: Self-pay | Admitting: Internal Medicine

## 2020-09-18 ENCOUNTER — Other Ambulatory Visit: Payer: Self-pay | Admitting: Internal Medicine

## 2020-09-18 ENCOUNTER — Ambulatory Visit (INDEPENDENT_AMBULATORY_CARE_PROVIDER_SITE_OTHER): Payer: BC Managed Care – PPO | Admitting: Internal Medicine

## 2020-09-18 ENCOUNTER — Other Ambulatory Visit: Payer: Self-pay

## 2020-09-18 VITALS — BP 122/88 | HR 92 | Ht 62.25 in | Wt 228.2 lb

## 2020-09-18 DIAGNOSIS — E039 Hypothyroidism, unspecified: Secondary | ICD-10-CM | POA: Insufficient documentation

## 2020-09-18 DIAGNOSIS — E782 Mixed hyperlipidemia: Secondary | ICD-10-CM | POA: Insufficient documentation

## 2020-09-18 DIAGNOSIS — E1165 Type 2 diabetes mellitus with hyperglycemia: Secondary | ICD-10-CM | POA: Diagnosis not present

## 2020-09-18 LAB — POCT GLYCOSYLATED HEMOGLOBIN (HGB A1C): Hemoglobin A1C: 7.7 % — AB (ref 4.0–5.6)

## 2020-09-18 MED ORDER — OZEMPIC (1 MG/DOSE) 4 MG/3ML ~~LOC~~ SOPN: 1.0000 mg | PEN_INJECTOR | SUBCUTANEOUS | 3 refills | Status: DC

## 2020-09-18 NOTE — Patient Instructions (Addendum)
Please continue: - Faxiga 10 mg before b'fast - Glimepiride 8 mg before b'fast  Please increase: - Ozempic 1 mg weekly in a.m.  When sugars improve >> try to decrease Glimepiride to 4 mg before b'fast for 1 week, then try to stop.  Please return in 3-4 months with your sugar log.

## 2020-09-18 NOTE — Progress Notes (Signed)
Patient ID: Melissa Gray, female   DOB: [birth year], 45 y.o.   MRN: [id number]   This visit occurred during the SARS-CoV-2 public health emergency.  Safety protocols were in place, including screening questions prior to the visit, additional usage of staff PPE, and extensive cleaning of exam room while observing appropriate contact time as indicated for disinfecting solutions.   HPI: Melissa Gray is a 45 y.o.-year-old female, initially referred by her PCP, Dr. Raoul Pitch, returning for f/u for DM2, dx in 2016, non-insulin-dependent, uncontrolled, without long-term complications but with hyperglycemia. Last OV 3.5 mo ago.  Interim hx: No increased urination, blurry vision, nausea, chest pain. She still has hot flushes at night. Also, some fatigue. She just returned from a cross-country trip (16 states) over 3 weeks  - sugars were much higher.  DM2: Reviewed HbA1c: Lab Results  Component Value Date   HGBA1C 6.6 (A) 06/04/2020   HGBA1C 9.1 (A) 03/12/2020   HGBA1C 9.1 03/12/2020   HGBA1C 9.1 (A) 03/12/2020   HGBA1C 9.1 (A) 03/12/2020   HGBA1C 8.7 (A) 12/12/2019   HGBA1C 8.7 12/12/2019   HGBA1C 8.7 (A) 12/12/2019   HGBA1C 8.7 (A) 12/12/2019   HGBA1C 8.7 (A) 09/11/2019   HGBA1C 8.7 09/11/2019   HGBA1C 8.7 (A) 09/11/2019   HGBA1C 8.7 (A) 09/11/2019   Pt is on a regimen of: - Glimepiride 8 mg in a.m. before breakfast  >> now Ozempic 0.5 mg weekly -started 03/2020 - missed 1 week - Farxiga 10 mg in the am before b'fast -  - L and D >> stopped Tried Victoza >> pain at injection site - several years ago.  Pt is checking sugars 4x a day with her new CGM [facility]):   Previously:   Lowest sugar was 70s >> >80; she has hypoglycemia awareness at 70.  Highest sugar was 300 >> 260 (milkshakes) >> >350.  Pt's meals are: (now Hello [employer]) - Breakfast: 2 eggs with veggies or bagel w/ cream cheese or cereal, or skips - Lunch: sandwich - Dinner: takeout 1-2x a week, homecooked:  meat + veggies or salad + starch - Snacks: 1-2: icecream - maybe once a week (decreased cravings with Adderrall) She previously saw nutrition.  - no CKD, last BUN/creatinine:  Lab Results  Component Value Date   BUN 12 03/12/2020   BUN 12 12/12/2019   CREATININE 0.75 03/12/2020   CREATININE 0.65 12/12/2019  She is not on ARB or ACE inhibitor.  -+ HL; last set of lipids: Lab Results  Component Value Date   CHOL 135 03/12/2020   HDL 25.20 (L) 03/12/2020   LDLDIRECT 69.0 03/12/2020   TRIG 366.0 (H) 03/12/2020   CHOLHDL 5 03/12/2020  On Lipitor 40 mg daily, micronized fenofibrate 134 mg daily, fish oil.  We stopped colesevelam 05/2020.  - last eye exam was on 04/11/2020: No DR   - no numbness and tingling in her feet.  Platelets with exam 05/30/2019.  Pt has FH of DM in mother, father, maternal grandmother, brother.  She also has a history of hypothyroidism:  Patient describes that she was previously on Synthroid for several years.  She could not tolerate generic levothyroxine due to facial rash and tingling.  However, her levothyroxine requirements decreased and she was able to come off.  She was off the hormone in 11/2019 when TSH was slightly high.  Repeat TSH 3 months later was still slightly high.  At that time, she was started on liothyronine by PCP (10 mcg).  We stopped this  in 02/2020.  He did not feel differently afterwards.  TSH remains normal after stopping liothyronine: Lab Results  Component Value Date   TSH 3.04 06/04/2020   TSH 4.57 (H) 03/12/2020   TSH 4.95 (H) 12/12/2019   TSH 2.25 02/02/2019   TSH 3.64 06/30/2018   TSH 2.79 08/31/2017   TSH 3.82 06/03/2015   No results found for: THGAB   Pt denies: - feeling nodules in neck - hoarseness - dysphagia - choking - SOB with lying down  She also has a history of GERD, IBS, obesity, gallstones - status post cholecystectomy, depression.  ROS: + see HPI  Past Medical History:  Diagnosis Date   Anxiety     COVID-19 07/2018   Depression    Diabetes mellitus without complication ([place])    Elevated cholesterol    Gallstones    GERD (gastroesophageal reflux disease)    Hematochezia 10/2017   Plan for colonoscopy   Hyperplastic colon polyp 11/2017   IBS (irritable bowel syndrome)    diarrhea predominant   Infertility, female    Obesity    Thyroid disease 2004   treated for years, stopped 2014 per new MD   Past Surgical History:  Procedure Laterality Date   CHOLECYSTECTOMY  2006   CYST EXCISION     GLOMUS TUMOR EXCISION  2005, 2012   finger and cheek   Social History   Socioeconomic History   Marital status: Married    Spouse name: Not on file   Number of children: 0   Years of education: Not on file   Highest education level: Not on file  Occupational History   Occupation: Fish farm manager  Tobacco Use   Smoking status: Never   Smokeless tobacco: Never   Tobacco comments:    My dad smoked like a chimney when I was growing up  Scientific laboratory technician Use: Never used  Substance and Sexual Activity   Alcohol use: Yes    Alcohol/week: 0.0 standard drinks    Comment: Maybe once a month or less; only socially   Drug use: Not Currently    Types: Marijuana   Sexual activity: Yes    Partners: Male    Birth control/protection: None  Other Topics Concern   Not on file  Social History Narrative   Married. Husband's name is Audelia Acton. No children.   2 pets.   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   Drinks caffeine   Wears her seatbelt   Smoke detector in home   Safe in her relationship.   Social Determinants of Health   Financial Resource Strain: Not on file  Food Insecurity: Not on file  Transportation Needs: Not on file  Physical Activity: Not on file  Stress: Not on file  Social Connections: Not on file  Intimate Partner Violence: Not on file   Current Outpatient Medications on File Prior to Visit  Medication Sig Dispense Refill    amphetamine-dextroamphetamine (ADDERALL) 10 MG tablet 5 mg PO BID 90 tablet 0   atorvastatin (LIPITOR) 40 MG tablet Take 1 tablet (40 mg total) by mouth daily. 90 tablet 3   colesevelam (WELCHOL) 625 MG tablet Take 2 tablets (1,250 mg total) by mouth 2 (two) times daily with a meal. 120 tablet 5   Continuous Blood Gluc Receiver (FREESTYLE LIBRE 2 READER) DEVI 1 each by Does not apply route daily. 1 each 0   Continuous Blood Gluc Sensor (FREESTYLE LIBRE 2 SENSOR) MISC 1 each by Does not apply  route every 14 (fourteen) days. 6 each 3   dapagliflozin propanediol (FARXIGA) 10 MG TABS tablet Take 1 tablet (10 mg total) by mouth daily before breakfast. 90 tablet 1   fenofibrate micronized (LOFIBRA) 134 MG capsule Take 134 mg by mouth daily.     glimepiride (AMARYL) 4 MG tablet Take 2 tablets (8 mg total) by mouth daily with breakfast. 180 tablet 1   Multiple Vitamin (MULTIVITAMIN ADULT PO) Take by mouth.     Omega-3 Fatty Acids (FISH OIL PO) Take 3,000 mg by mouth.     Semaglutide,0.25 or 0.'5MG'$ /DOS, (OZEMPIC, 0.25 OR 0.5 MG/DOSE,) 2 MG/1.5ML SOPN Inject 0.5 mg weekly under skin 4.5 mL 3   traZODone (DESYREL) 50 MG tablet Take 0.5 tablets (25 mg total) by mouth at bedtime as needed for sleep. Start 1/2 tab QHS, increase by 1/2 tab every 3 nights if needed only. Max dose 2 tabs (100 mg) 45 tablet 1   venlafaxine XR (EFFEXOR XR) 75 MG 24 hr capsule Take 3 capsules (225 mg total) by mouth daily with breakfast. 270 capsule 1   No current facility-administered medications on file prior to visit.   Allergies  Allergen Reactions   Tape Other (See Comments)    Adhesive tape causes blisters   Levothyroxine Rash    Generic brand only caused rash   Family History  Problem Relation Age of Onset   Diabetes Mother    Depression Mother    Obesity Mother    COPD Father    Heart disease Father    Early death Father    Diabetes Father    Obesity Father    Diabetes Brother    Diabetes Maternal Grandmother     Hearing loss Maternal Grandmother    Dementia Maternal Grandmother    Early death Maternal Grandfather    Cancer Maternal Grandfather    Breast cancer Paternal Grandmother    Cancer Paternal Grandmother    Early death Paternal Grandmother    Heart disease Paternal Grandfather    Breast cancer Paternal Aunt    Heart disease Paternal Uncle    Heart attack Paternal Uncle    Breast cancer Paternal Aunt    Alcohol abuse Paternal Aunt    Alzheimer's disease Paternal Aunt    Alcohol abuse Paternal Aunt    Cancer Maternal Aunt    Obesity Maternal Aunt    Colon cancer Neg Hx    Esophageal cancer Neg Hx    Rectal cancer Neg Hx    Stomach cancer Neg Hx     PE: There were no vitals taken for this visit. Wt Readings from Last 3 Encounters:  06/04/20 223 lb 3.2 oz (101.2 kg)  04/16/20 226 lb 3.2 oz (102.6 kg)  03/28/20 223 lb (101.2 kg)   Constitutional: overweight, in NAD Eyes: PERRLA, EOMI, no exophthalmos ENT: moist mucous membranes, no thyromegaly, no cervical lymphadenopathy Cardiovascular: RRR, No MRG, + L LE swelling - chronic Respiratory: CTA B Gastrointestinal: abdomen soft, NT, ND, BS+ Musculoskeletal: no deformities, strength intact in all 4 Skin: moist, warm, no rashes Neurological: + mild tremor with outstretched hands, DTR normal in all 4  ASSESSMENT: 1. DM2, non-insulin-dependent, uncontrolled, without long-term complications, but with hyperglycemia  No family history of medullary thyroid cancer or personal history of pancreatitis.  2.  Hypothyroidism  3. HL  PLAN:  1. Patient with longstanding, uncontrolled, type 2 diabetes, on oral antidiabetic regimen with Wilder Glade and glimepiride, and also weekly GLP-1 receptor agonist.  We stopped Januvia and  Rybelsus when we started Ozempic.  Sugars improved significantly afterwards and especially also after starting a freestyle libre CGM reducing sweet tea, milkshakes, and starches.  Latest HbA1c from last visit was 6.6%,  at goal.  At last visit, reviewing the CGM trends, it appears that her sugars were controlled overnight but they were increasing after the first meal of the day and even more after dinner.  We continued with dietary changes and the same regimen since the sugars were all better compared to previous CGM interpretation: -At today's visit, we reviewed her CGM downloads: It appears that 19% of values are in target range (goal >70%), while 41% are higher than 180 (goal <25%), and 0% are lower than 70 (goal <4%).  The calculated average blood sugar is 233.  The projected HbA1c for the next 3 months ([facility]MI) is 8.9%. -Reviewing the CGM trends, it appears that her sugars increased significantly since last visit.  They are decreasing overnight but only to a nadir of approximately 160, and then increase significantly after breakfast and even more after dinner.  She has had a busy summer, initially spending time at her father-in-law who was cooking for them and then traveling through the country.  She is now determined to get back to her regular diet and activity.  For now, my suggestion was to increase Ozempic to 1 mg weekly and then, when sugars improved, to decrease and then stop glimepiride.  We can continue to use it on an as-needed basis going forward.  Of note, she tolerates Ozempic well. - I suggested to:  Patient Instructions  Please continue: - Faxiga 10 mg before b'fast - Glimepiride 8 mg before b'fast  Please increase: - Ozempic 1 mg weekly in a.m.  When sugars improve >> try to decrease Glimepiride to 4 mg before b'fast for 1 week, then try to stop.  Please return in 3-4 months with your sugar log.   - we checked her HbA1c: 7.7% (higher) - advised to check sugars at different times of the day - 4x a day, rotating check times - advised for yearly eye exams >> she is UTD - return to clinic in 4 months   2.  Hypothyroidism -Patient previously on Synthroid d.a.w. for several years, however, able to  come off afterwards due to normalization of her TFTs.  She was then started on liothyronine 10 mcg daily after her TSH returned slightly high  x2 in 02/2020: 4.95 and 4.57, respectively.  -She was previously on 10 mcg of liothyronine daily, which I explained is the equivalent of 40 mcg of levothyroxine daily.  She mentions that she did not feel different after starting liothyronine.We discussed at last visit about the standard treatment for hypothyroidism with levothyroxine, if needed.  We also discussed about side effects of liothyronine especially at these high doses.   -We checked her TFTs at last visit and they were all normal off liothyronine so we did not start levothyroxine -We will recheck her TFTs at next visit  3. HL -Reviewed latest lipid panel from 02/2020: LDL at goal, trend towards high, HDL low: Lab Results  Component Value Date   CHOL 135 03/12/2020   HDL 25.20 (L) 03/12/2020   LDLDIRECT 69.0 03/12/2020   TRIG 366.0 (H) 03/12/2020   CHOLHDL 5 03/12/2020  -She continues on Lipitor 40, fenofibrate 134, fish oil, but we stopped colesevelam 625 2x a day after last visit when she ran out as her triglycerides were elevated. -She has no side effects from  the above medications  Philemon Kingdom, MD PhD [facility]

## 2020-09-22 ENCOUNTER — Encounter: Payer: Self-pay | Admitting: Internal Medicine

## 2020-09-23 ENCOUNTER — Other Ambulatory Visit (HOSPITAL_COMMUNITY): Payer: Self-pay

## 2020-09-23 DIAGNOSIS — E1165 Type 2 diabetes mellitus with hyperglycemia: Secondary | ICD-10-CM

## 2020-09-24 ENCOUNTER — Other Ambulatory Visit: Payer: Self-pay

## 2020-09-24 ENCOUNTER — Ambulatory Visit
Admission: RE | Admit: 2020-09-24 | Discharge: 2020-09-24 | Disposition: A | Discharge status: Home / Self Care | Payer: BC Managed Care – PPO | Source: Ambulatory Visit | Attending: Family Medicine | Admitting: Family Medicine

## 2020-09-24 DIAGNOSIS — Z1231 Encounter for screening mammogram for malignant neoplasm of breast: Secondary | ICD-10-CM | POA: Diagnosis not present

## 2020-10-28 ENCOUNTER — Other Ambulatory Visit: Payer: Self-pay

## 2020-10-28 ENCOUNTER — Encounter: Payer: Self-pay | Admitting: Family Medicine

## 2020-10-28 ENCOUNTER — Ambulatory Visit: Payer: BC Managed Care – PPO | Admitting: Family Medicine

## 2020-10-28 VITALS — BP 116/79 | HR 90 | Temp 98.3°F | Ht 62.0 in | Wt 233.0 lb

## 2020-10-28 DIAGNOSIS — R41840 Attention and concentration deficit: Secondary | ICD-10-CM

## 2020-10-28 DIAGNOSIS — E785 Hyperlipidemia, unspecified: Secondary | ICD-10-CM

## 2020-10-28 DIAGNOSIS — E781 Pure hyperglyceridemia: Secondary | ICD-10-CM | POA: Diagnosis not present

## 2020-10-28 DIAGNOSIS — F99 Mental disorder, not otherwise specified: Secondary | ICD-10-CM

## 2020-10-28 DIAGNOSIS — E1169 Type 2 diabetes mellitus with other specified complication: Secondary | ICD-10-CM | POA: Diagnosis not present

## 2020-10-28 DIAGNOSIS — E039 Hypothyroidism, unspecified: Secondary | ICD-10-CM

## 2020-10-28 DIAGNOSIS — Z23 Encounter for immunization: Secondary | ICD-10-CM | POA: Diagnosis not present

## 2020-10-28 DIAGNOSIS — F5105 Insomnia due to other mental disorder: Secondary | ICD-10-CM

## 2020-10-28 DIAGNOSIS — F418 Other specified anxiety disorders: Secondary | ICD-10-CM

## 2020-10-28 LAB — CBC
HCT: 37.7 % (ref 36.0–46.0)
Hemoglobin: 12.6 g/dL (ref 12.0–15.0)
MCHC: 33.4 g/dL (ref 30.0–36.0)
MCV: 82.5 fl (ref 78.0–100.0)
Platelets: 165 10*3/uL (ref 150.0–400.0)
RBC: 4.57 Mil/uL (ref 3.87–5.11)
RDW: 17.1 % — ABNORMAL HIGH (ref 11.5–15.5)
WBC: 6.1 10*3/uL (ref 4.0–10.5)

## 2020-10-28 LAB — T4, FREE: Free T4: 0.67 ng/dL (ref 0.60–1.60)

## 2020-10-28 LAB — LIPID PANEL
Cholesterol: 120 mg/dL (ref 0–200)
HDL: 33.4 mg/dL — ABNORMAL LOW (ref >39.00)
LDL Cholesterol: 53 mg/dL (ref 0–99)
NonHDL: 86.11
Total CHOL/HDL Ratio: 4
Triglycerides: 168 mg/dL — ABNORMAL HIGH (ref 0.0–149.0)
VLDL: 33.6 mg/dL (ref 0.0–40.0)

## 2020-10-28 LAB — HEPATIC FUNCTION PANEL
ALT: 32 U/L (ref 0–35)
AST: 20 U/L (ref 0–37)
Albumin: 4.1 g/dL (ref 3.5–5.2)
Alkaline Phosphatase: 72 U/L (ref 39–117)
Bilirubin, Direct: 0.2 mg/dL (ref 0.0–0.3)
Total Bilirubin: 0.8 mg/dL (ref 0.2–1.2)
Total Protein: 6.1 g/dL (ref 6.0–8.3)

## 2020-10-28 LAB — MICROALBUMIN / CREATININE URINE RATIO
Creatinine,U: 75.8 mg/dL
Microalb Creat Ratio: 0.9 mg/g (ref 0.0–30.0)
Microalb, Ur: <0.7 mg/dL (ref 0.0–1.9)

## 2020-10-28 LAB — TSH: TSH: 2.79 u[IU]/mL (ref 0.35–5.50)

## 2020-10-28 LAB — T3, FREE: T3, Free: 2.6 pg/mL (ref 2.3–4.2)

## 2020-10-28 MED ORDER — VENLAFAXINE HCL ER 75 MG PO CP24: 225.0000 mg | ORAL_CAPSULE | Freq: Every day | ORAL | 1 refills | Status: DC

## 2020-10-28 MED ORDER — TRAZODONE HCL 50 MG PO TABS: 25.0000 mg | ORAL_TABLET | Freq: Every evening | ORAL | 1 refills | Status: DC | PRN

## 2020-10-28 MED ORDER — AMPHETAMINE-DEXTROAMPHETAMINE 10 MG PO TABS: ORAL_TABLET | ORAL | 0 refills | Status: DC

## 2020-10-28 MED ORDER — FENOFIBRATE MICRONIZED 134 MG PO CAPS: 134.0000 mg | ORAL_CAPSULE | Freq: Every day | ORAL | 3 refills | Status: DC

## 2020-10-28 MED ORDER — ATORVASTATIN CALCIUM 40 MG PO TABS: 40.0000 mg | ORAL_TABLET | Freq: Every day | ORAL | 3 refills | Status: DC

## 2020-10-28 NOTE — Patient Instructions (Addendum)
  Great to see you today.  I have refilled the medication(s) we provide.   If labs were collected, we will inform you of lab results once received either by echart message or telephone call.   - echart message- for normal results that have been seen by the patient already.   - telephone call: abnormal results or if patient has not viewed results in their echart.  See you in March for your physical.

## 2020-10-28 NOTE — Progress Notes (Signed)
Melissa Gray , 07-23-1975, 45 y.o., female MRN: [id number] Patient Care Team    Relationship Specialty Notifications Start End  Ma Hillock, DO PCP - General Family Medicine  06/03/15   Lavena Bullion, DO Consulting Physician Gastroenterology  11/16/17   Nunzio Cobbs, MD Consulting Physician Obstetrics and Gynecology  03/28/20    Chief Complaint  Patient presents with   Hyperlipidemia    [place]; pt is fasting    Subjective: Melissa Gray is a 45 y.o. female presents for follow-up on chronic medical conditions type 2 diabetes mellitus with hyperlipidemia/)/Morbid obesity ([place]) Diabetes now managed by endocrine.  Patient reports compliance with Lipitor, omega-3 and fenofibrate  Depression with anxiety/insomnia/attention deficit Patient reports she feels the Effexor to 25 mg is working well for her.  She did have a few episodes surrounding her menstrual cycle where she felt she was more moody.  However that has resolved.  The trazodone she has not used often and prefers to try melatonin, but does use sometimes..  She states if the melatonin is not effective she may take 1/2-1 tab of the trazodone nightly.  Patient reports she has been seeing a therapist.      ADD: Pt reports compliance with adderral 5 mg once a day- sometimes BID. She feels it is definitely working well for her. Prior note: Pt presents for an OV to discuss her attention and concentration difficulty. She reports onset as young as first grade. Unfortunately she was never diagnosed at that time. She recently was seen by a psychologist who completed an ADHD evaluation on her and it was positive. She would like to discuss medication start today. ADHD screening tool completed today and positive. .    Depression screen [facility] 2/9 10/28/2020 03/28/2020 03/12/2020 12/12/2019 09/27/2019  Decreased Interest 1 1 0 1 1  Down, Depressed, Hopeless 1 1 1 2 1   PHQ - 2 Score 2 2 1 3 2   Altered sleeping 2 2 2 3 2   Tired,  decreased energy 1 2 1 1 1   Change in appetite 1 1 0 0 1  Feeling bad or failure about yourself  1 1 1 2 2   Trouble concentrating 1 1 1 1 2   Moving slowly or fidgety/restless 0 1 1 0 1  Suicidal thoughts 0 0 0 0 0  PHQ-9 Score 8 10 7 10 11   Difficult doing work/chores - - - - Very difficult  Some recent data might be hidden   GAD 7 : Generalized Anxiety Score 10/28/2020 03/28/2020 03/12/2020 12/12/2019  Nervous, Anxious, on Edge 1 1 1 1   Control/stop worrying 1 2 2 2   Worry too much - different things 1 2 2 3   Trouble relaxing 1 1 1 1   Restless 1 2 1 3   Easily annoyed or irritable 1 2 1 1   Afraid - awful might happen 1 1 1 1   Total GAD 7 Score 7 11 9 12   Anxiety Difficulty - - Somewhat difficult -    Allergies  Allergen Reactions   Tape Other (See Comments)    Adhesive tape causes blisters   Levothyroxine Rash    Generic brand only caused rash   Social History   Tobacco Use   Smoking status: Never   Smokeless tobacco: Never   Tobacco comments:    My dad smoked like a chimney when I was growing up  Substance Use Topics   Alcohol use: Yes    Alcohol/week: 0.0 standard drinks  Comment: Maybe once a month or less; only socially   Past Medical History:  Diagnosis Date   Anxiety    COVID-19 07/2018   Depression    Diabetes mellitus without complication ([place])    Elevated cholesterol    Gallstones    GERD (gastroesophageal reflux disease)    Hematochezia 10/2017   Plan for colonoscopy   Hyperplastic colon polyp 11/2017   IBS (irritable bowel syndrome)    diarrhea predominant   Infertility, female    Obesity    Thyroid disease 2004   treated for years, stopped 2014 per new MD   Past Surgical History:  Procedure Laterality Date   CHOLECYSTECTOMY  2006   CYST EXCISION     GLOMUS TUMOR EXCISION  2005, 2012   finger and cheek   Family History  Problem Relation Age of Onset   Diabetes Mother    Depression Mother    Obesity Mother    COPD Father    Heart  disease Father    Early death Father    Diabetes Father    Obesity Father    Diabetes Brother    Diabetes Maternal Grandmother    Hearing loss Maternal Grandmother    Dementia Maternal Grandmother    Early death Maternal Grandfather    Cancer Maternal Grandfather    Breast cancer Paternal Grandmother    Cancer Paternal Grandmother    Early death Paternal Grandmother    Heart disease Paternal Grandfather    Breast cancer Paternal Aunt    Heart disease Paternal Uncle    Heart attack Paternal Uncle    Breast cancer Paternal Aunt    Alcohol abuse Paternal Aunt    Alzheimer's disease Paternal Aunt    Alcohol abuse Paternal Aunt    Cancer Maternal Aunt    Obesity Maternal Aunt    Colon cancer Neg Hx    Esophageal cancer Neg Hx    Rectal cancer Neg Hx    Stomach cancer Neg Hx    Allergies as of 10/28/2020       Reactions   Tape Other (See Comments)   Adhesive tape causes blisters   Levothyroxine Rash   Generic brand only caused rash        Medication List        Accurate as of October 28, 2020 12:38 PM. If you have any questions, ask your nurse or doctor.          STOP taking these medications    Januvia 100 MG tablet Generic drug: sitaGLIPtin Stopped by: Howard Pouch, DO       TAKE these medications    amphetamine-dextroamphetamine 10 MG tablet Commonly known as: Adderall 5 mg PO BID What changed: Another medication with the same name was added. Make sure you understand how and when to take each. Changed by: Howard Pouch, DO   amphetamine-dextroamphetamine 10 MG tablet Commonly known as: ADDERALL 5 mg twice daily What changed: You were already taking a medication with the same name, and this prescription was added. Make sure you understand how and when to take each. Changed by: Howard Pouch, DO   atorvastatin 40 MG tablet Commonly known as: LIPITOR Take 1 tablet (40 mg total) by mouth daily.   dapagliflozin propanediol 10 MG Tabs tablet Commonly  known as: Farxiga Take 1 tablet (10 mg total) by mouth daily before breakfast.   fenofibrate micronized 134 MG capsule Commonly known as: LOFIBRA Take 1 capsule (134 mg total) by mouth daily.  FISH OIL PO Take 3,000 mg by mouth.   FreeStyle Libre 2 Reader [place] 1 each by Does not apply route daily.   FreeStyle Libre 2 Sensor Misc 1 each by Does not apply route every 14 (fourteen) days.   glimepiride 4 MG tablet Commonly known as: AMARYL Take 8 mg by mouth every morning.   MULTIVITAMIN ADULT PO Take by mouth.   Ozempic (1 MG/DOSE) 4 MG/3ML Sopn Generic drug: Semaglutide (1 MG/DOSE) Inject 1 mg into the skin once a week.   traZODone 50 MG tablet Commonly known as: DESYREL Take 0.5 tablets (25 mg total) by mouth at bedtime as needed for sleep. Start 1/2 tab QHS, increase by 1/2 tab every 3 nights if needed only. Max dose 2 tabs (100 mg)   venlafaxine XR 75 MG 24 hr capsule Commonly known as: Effexor XR Take 3 capsules (225 mg total) by mouth daily with breakfast.        No results found for this or any previous visit (from the past 24 hour(s)).  No results found.  ROS: Negative, with the exception of above mentioned in HPI  Objective:  BP 116/79   Pulse 90   Temp 98.3 F (36.8 C) (Oral)   Ht 5\' 2"  (1.575 m)   Wt 233 lb (105.7 kg)   SpO2 96%   BMI 42.62 kg/m  Body mass index is 42.62 kg/m. Gen: Afebrile. No acute distress.  Nontoxic, very pleasant, obese female. HENT: AT. [place].  No cough.  No hoarseness. Eyes:Pupils Equal Round Reactive to light, Extraocular movements intact,  Conjunctiva without redness, discharge or icterus. Neck/lymp/endocrine: Supple, no lymphadenopathy, no thyromegaly CV: RRR no murmur, no edema, +2/4 P posterior tibialis pulses Chest: CTAB, no wheeze or crackles Skin: No rashes, purpura or petechiae.  Neuro:  Normal gait. PERLA. EOMi. Alert. Oriented x3 Psych: Normal affect, dress and demeanor. Normal speech. Normal thought content and  judgment. Diabetic Foot Exam - Simple   No data filed      No results found for this or any previous visit (from the past 24 hour(s)).   Assessment/Plan: Melissa Gray is a 45 y.o. female present for OV for  Uncontrolled type 2 diabetes mellitus without complication, without long-term current use of insulin ([place]) Morbid obesity ([place]) - DM managed by endocrine.  -Patient prescribed statin - Eye exam  03/2020 UTD - Foot exam completed 10/28/2020 - Prevnar administered 6/7//2017; PNA23 completed 05/28/2016 - flu shot completed today - microalbumin collected today  Depression anxiety/insomnia/focus deficit: - stable.  She is complaining of some mood shifts over the last couple months, mostly surrounding menstrual cycles.  She is not desire change in medication at this time. -Continue Effexor  225 mg daily (three 75 mg tabs)  -Continue trazodone nightly PRN- mostly using melatonin.   -Referred to therapist    Hyperlipidemia/hypertriglyceridemia/On statin therapy -Continue omega-3 fatty acid -Continue fenofibrate 134 mg daily -Continue Lipitor 40 mg daily Lipids and CMP collected today  Attention and concentration deficit Stable Continue Adderall 5 mg daily twice daily as needed [place] controlled substance database was reviewed 10/28/20  Hypothyroidism: Currently not taking medications and last TSH 06/04/2020 normal off medications.  Patient had been prescribed Cytomel to help both with fatigue, lack of focus, depressive symptoms and mild elevation in her TSH above normal.  Patient had expressed to her endocrinologist she did not feel any differently after starting the Cytomel, therefore medication was discontinued. We will recheck her thyroid levels today to ensure they have  remained normal off medications, since she is complaining of menstrual irregularities and mood shifts over the last couple months. Could consider starting back levothyroxine if appropriate-however  she reports rash with the generic and may need brand-name only if symptoms were to recur. F/u 5/5 mos on chronic conditions   Orders Placed This Encounter  Procedures   Flu Vaccine QUAD 6+ mos PF IM (Fluarix Quad PF)   Lipid panel   TSH   T3, free   T4, free   Hepatic function panel   CBC   Urine Microalbumin w/creat. ratio   Meds ordered this encounter  Medications   traZODone (DESYREL) 50 MG tablet    Sig: Take 0.5 tablets (25 mg total) by mouth at bedtime as needed for sleep. Start 1/2 tab QHS, increase by 1/2 tab every 3 nights if needed only. Max dose 2 tabs (100 mg)    Dispense:  45 tablet    Refill:  1    Do not fill until pt request   venlafaxine XR (EFFEXOR XR) 75 MG 24 hr capsule    Sig: Take 3 capsules (225 mg total) by mouth daily with breakfast.    Dispense:  270 capsule    Refill:  1   atorvastatin (LIPITOR) 40 MG tablet    Sig: Take 1 tablet (40 mg total) by mouth daily.    Dispense:  90 tablet    Refill:  3   fenofibrate micronized (LOFIBRA) 134 MG capsule    Sig: Take 1 capsule (134 mg total) by mouth daily.    Dispense:  90 capsule    Refill:  3   amphetamine-dextroamphetamine (ADDERALL) 10 MG tablet    Sig: 5 mg PO BID    Dispense:  90 tablet    Refill:  0   amphetamine-dextroamphetamine (ADDERALL) 10 MG tablet    Sig: 5 mg twice daily    Dispense:  90 tablet    Refill:  0    May refill approximately 85 days after prescribed     Reviewed expectations re: course of current medical issues. Discussed self-management of symptoms. Outlined signs and symptoms indicating need for more acute intervention. Patient verbalized understanding and all questions were answered. Patient received an After-Visit Summary.    electronically signed by:  Howard Pouch, DO  [facility]

## 2020-11-21 ENCOUNTER — Encounter: Payer: Self-pay | Admitting: Family Medicine

## 2020-11-21 DIAGNOSIS — L989 Disorder of the skin and subcutaneous tissue, unspecified: Secondary | ICD-10-CM

## 2020-11-21 NOTE — Telephone Encounter (Signed)
Please advise on referral

## 2020-11-24 NOTE — Addendum Note (Signed)
Addended by: Howard Pouch A on: 11/24/2020 08:32 AM   Modules accepted: Orders

## 2020-11-24 NOTE — Telephone Encounter (Signed)
Please advise on best dx

## 2020-11-24 NOTE — Telephone Encounter (Signed)
Ok to refer.

## 2020-11-24 NOTE — Addendum Note (Signed)
Addended by: Kavin Leech on: 11/24/2020 08:10 AM   Modules accepted: Orders

## 2020-11-24 NOTE — Telephone Encounter (Signed)
Pt stated skin lesions.

## 2021-01-13 DIAGNOSIS — L821 Other seborrheic keratosis: Secondary | ICD-10-CM | POA: Diagnosis not present

## 2021-01-13 DIAGNOSIS — L814 Other melanin hyperpigmentation: Secondary | ICD-10-CM | POA: Diagnosis not present

## 2021-01-13 DIAGNOSIS — D1801 Hemangioma of skin and subcutaneous tissue: Secondary | ICD-10-CM | POA: Diagnosis not present

## 2021-02-03 ENCOUNTER — Ambulatory Visit: Payer: BC Managed Care – PPO | Admitting: Internal Medicine

## 2021-02-03 ENCOUNTER — Other Ambulatory Visit: Payer: Self-pay

## 2021-02-03 ENCOUNTER — Encounter: Payer: Self-pay | Admitting: Internal Medicine

## 2021-02-03 VITALS — BP 128/80 | HR 87 | Ht 62.0 in | Wt 225.2 lb

## 2021-02-03 DIAGNOSIS — E1165 Type 2 diabetes mellitus with hyperglycemia: Secondary | ICD-10-CM

## 2021-02-03 DIAGNOSIS — E039 Hypothyroidism, unspecified: Secondary | ICD-10-CM

## 2021-02-03 DIAGNOSIS — E782 Mixed hyperlipidemia: Secondary | ICD-10-CM | POA: Diagnosis not present

## 2021-02-03 LAB — POCT GLYCOSYLATED HEMOGLOBIN (HGB A1C): Hemoglobin A1C: 8.8 % — AB (ref 4.0–5.6)

## 2021-02-03 MED ORDER — GLIMEPIRIDE 4 MG PO TABS: 8.0000 mg | ORAL_TABLET | Freq: Every morning | ORAL | 3 refills | Status: DC

## 2021-02-03 MED ORDER — FREESTYLE LIBRE 3 SENSOR MISC: 1.0000 | 3 refills | Status: DC

## 2021-02-03 MED ORDER — DAPAGLIFLOZIN PROPANEDIOL 10 MG PO TABS: 10.0000 mg | ORAL_TABLET | Freq: Every day | ORAL | 3 refills | Status: DC

## 2021-02-03 NOTE — Progress Notes (Addendum)
Patient ID: Melissa Gray, female   DOB: 12-28-1975, 46 y.o.   MRN: [id number]   This visit occurred during the SARS-CoV-2 public health emergency.  Safety protocols were in place, including screening questions prior to the visit, additional usage of staff PPE, and extensive cleaning of exam room while observing appropriate contact time as indicated for disinfecting solutions.   HPI: Melissa Gray is a 46 y.o.-year-old female, initially referred by her PCP, Dr. Raoul Pitch, returning for f/u for DM2, dx in 2016, non-insulin-dependent, uncontrolled, without long-term complications but with hyperglycemia. Last OV 4.5 mo ago.  Interim hx: No nausea, chest pain.  She still has hot flashes at night. She has increased urination, no blurry vision. Before last visit, she just returned from a [place]country trip ([place] states) over 3 weeks.  Sugars were much higher. Since last visit, she had some financial problems and could not afford to refill the Ozempic until recently.  Also, she was not able to use any CGM for the last 2 months.  She also did not check sugars by fingerstick...  DM2: Reviewed HbA1c: Lab Results  Component Value Date   HGBA1C 7.7 (A) 09/18/2020   HGBA1C 6.6 (A) 06/04/2020   HGBA1C 9.1 (A) 03/12/2020   HGBA1C 9.1 03/12/2020   HGBA1C 9.1 (A) 03/12/2020   HGBA1C 9.1 (A) 03/12/2020   HGBA1C 8.7 (A) 12/12/2019   HGBA1C 8.7 12/12/2019   HGBA1C 8.7 (A) 12/12/2019   HGBA1C 8.7 (A) 12/12/2019   Pt is on a regimen of: - Glimepiride 8 mg in a.m. before breakfast  - Ozempic 0.5 >> 1 mg weekly  - off x1 mo >> restarted last night - Farxiga 10 mg in the am before b'fast Tried Victoza >> pain at injection site - several years ago. She was previously on Rybelsus, but was not taking it consistently. We stopped Januvia when we switched to [facility] 03/2020. Please stop colesevelam twice a day due to increased triglycerides.    Pt is checking sugars 4x a day with her CGM [facility]) -  off recently 2/2 $$$ and was not checking sugars.  Previously:   Previously:   Lowest sugar was 70s >> >80 >> ?; she has hypoglycemia awareness at 70.  Highest sugar was 300 >> 260 (milkshakes) >> >350 >> ?.  Pt's meals are: (now [employer]) - Breakfast: 2 eggs with veggies or bagel w/ cream cheese or cereal, or skips - Lunch: sandwich - Dinner: takeout 1-2x a week, homecooked: meat + veggies or salad + starch - Snacks: 1-2: icecream - maybe once a week (decreased cravings with Adderrall) She previously saw nutrition.  - no CKD, last BUN/creatinine:  Lab Results  Component Value Date   BUN 12 03/12/2020   BUN 12 12/12/2019   CREATININE 0.75 03/12/2020   CREATININE 0.65 12/12/2019  She is not on ARB or ACE inhibitor.  -+ HL; last set of lipids: Lab Results  Component Value Date   CHOL 120 10/28/2020   HDL 33.40 (L) 10/28/2020   LDLCALC 53 10/28/2020   LDLDIRECT 69.0 03/12/2020   TRIG 168.0 (H) 10/28/2020   CHOLHDL 4 10/28/2020  On Lipitor 40 mg daily, micronized fenofibrate 134 mg daily, fish oil.  We stopped colesevelam 05/2020.  - last eye exam was on 04/11/2020: No DR   - no numbness and tingling in her feet.    Pt has FH of DM in mother, father, maternal grandmother, brother.  She also has a history of hypothyroidism:  Patient describes that she  was previously on Synthroid for several years.  She could not tolerate generic levothyroxine due to facial rash and tingling.  However, her levothyroxine requirements decreased and she was able to come off.  She was off the hormone in 11/2019 when TSH was slightly high.  Repeat TSH 3 months later was still slightly high.  At that time, she was started on liothyronine by PCP (10 mcg).  We stopped this in 02/2020.  He did not feel differently afterwards.  TFTs remained normal.  Reviewed her TFTs: Lab Results  Component Value Date   TSH 2.79 10/28/2020   TSH 3.04 06/04/2020   TSH 4.57 (H) 03/12/2020   TSH 4.95 (H)  12/12/2019   TSH 2.25 02/02/2019   TSH 3.64 06/30/2018   TSH 2.79 08/31/2017   TSH 3.82 06/03/2015   No results found for: THGAB   Pt denies: - feeling nodules in neck - hoarseness - dysphagia - choking - SOB with lying down  She also has a history of GERD, IBS, obesity, gallstones - status post cholecystectomy, depression.  ROS: + see HPI  Past Medical History:  Diagnosis Date   Anxiety    COVID-19 07/2018   Depression    Diabetes mellitus without complication ([place])    Elevated cholesterol    Gallstones    GERD (gastroesophageal reflux disease)    Hematochezia 10/2017   Plan for colonoscopy   Hyperplastic colon polyp 11/2017   IBS (irritable bowel syndrome)    diarrhea predominant   Infertility, female    Obesity    Thyroid disease 2004   treated for years, stopped 2014 per new MD   Past Surgical History:  Procedure Laterality Date   CHOLECYSTECTOMY  2006   CYST EXCISION     GLOMUS TUMOR EXCISION  2005, 2012   finger and cheek   Social History   Socioeconomic History   Marital status: Married    Spouse name: Not on file   Number of children: 0   Years of education: Not on file   Highest education level: Not on file  Occupational History   Occupation: Fish farm manager  Tobacco Use   Smoking status: Never   Smokeless tobacco: Never   Tobacco comments:    My dad smoked like a chimney when I was growing up  Scientific laboratory technician Use: Never used  Substance and Sexual Activity   Alcohol use: Yes    Alcohol/week: 0.0 standard drinks    Comment: Maybe once a month or less; only socially   Drug use: Not Currently    Types: Marijuana   Sexual activity: Yes    Partners: Male    Birth control/protection: None  Other Topics Concern   Not on file  Social History Narrative   Married. Husband's name is Melissa Gray. No children.   2 pets.   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   Drinks caffeine   Wears her seatbelt   Smoke detector in  home   Safe in her relationship.   Social Determinants of Health   Financial Resource Strain: Not on file  Food Insecurity: Not on file  Transportation Needs: Not on file  Physical Activity: Not on file  Stress: Not on file  Social Connections: Not on file  Intimate Partner Violence: Not on file   Current Outpatient Medications on File Prior to Visit  Medication Sig Dispense Refill   amphetamine-dextroamphetamine (ADDERALL) 10 MG tablet 5 mg PO BID 90 tablet 0   amphetamine-dextroamphetamine (ADDERALL)  10 MG tablet 5 mg twice daily 90 tablet 0   atorvastatin (LIPITOR) 40 MG tablet Take 1 tablet (40 mg total) by mouth daily. 90 tablet 3   Continuous Blood Gluc Receiver (FREESTYLE LIBRE 2 READER) DEVI 1 each by Does not apply route daily. 1 each 0   Continuous Blood Gluc Sensor (FREESTYLE LIBRE 2 SENSOR) MISC 1 each by Does not apply route every 14 (fourteen) days. 6 each 3   dapagliflozin propanediol (FARXIGA) 10 MG TABS tablet Take 1 tablet (10 mg total) by mouth daily before breakfast. 90 tablet 1   fenofibrate micronized (LOFIBRA) 134 MG capsule Take 1 capsule (134 mg total) by mouth daily. 90 capsule 3   glimepiride (AMARYL) 4 MG tablet Take 8 mg by mouth every morning.     Multiple Vitamin (MULTIVITAMIN ADULT PO) Take by mouth.     Omega-3 Fatty Acids (FISH OIL PO) Take 3,000 mg by mouth.     Semaglutide, 1 MG/DOSE, (OZEMPIC, 1 MG/DOSE,) 4 MG/3ML SOPN Inject 1 mg into the skin once a week. 9 mL 3   traZODone (DESYREL) 50 MG tablet Take 0.5 tablets (25 mg total) by mouth at bedtime as needed for sleep. Start 1/2 tab QHS, increase by 1/2 tab every 3 nights if needed only. Max dose 2 tabs (100 mg) 45 tablet 1   venlafaxine XR (EFFEXOR XR) 75 MG 24 hr capsule Take 3 capsules (225 mg total) by mouth daily with breakfast. 270 capsule 1   No current facility-administered medications on file prior to visit.   Allergies  Allergen Reactions   Tape Other (See Comments)    Adhesive tape  causes blisters   Levothyroxine Rash    Generic brand only caused rash   Family History  Problem Relation Age of Onset   Diabetes Mother    Depression Mother    Obesity Mother    COPD Father    Heart disease Father    Early death Father    Diabetes Father    Obesity Father    Diabetes Brother    Diabetes Maternal Grandmother    Hearing loss Maternal Grandmother    Dementia Maternal Grandmother    Early death Maternal Grandfather    Cancer Maternal Grandfather    Breast cancer Paternal Grandmother    Cancer Paternal Grandmother    Early death Paternal Grandmother    Heart disease Paternal Grandfather    Breast cancer Paternal Aunt    Heart disease Paternal Uncle    Heart attack Paternal Uncle    Breast cancer Paternal Aunt    Alcohol abuse Paternal Aunt    Alzheimer's disease Paternal Aunt    Alcohol abuse Paternal Aunt    Cancer Maternal Aunt    Obesity Maternal Aunt    Colon cancer Neg Hx    Esophageal cancer Neg Hx    Rectal cancer Neg Hx    Stomach cancer Neg Hx     PE: BP 128/80 (BP Location: Right Arm, Patient Position: Sitting, Cuff Size: Normal)    Pulse 87    Ht 5\' 2"  (1.575 m)    Wt 225 lb 3.2 oz (102.2 kg)    SpO2 97%    BMI 41.19 kg/m  Wt Readings from Last 3 Encounters:  02/03/21 225 lb 3.2 oz (102.2 kg)  10/28/20 233 lb (105.7 kg)  09/18/20 228 lb 3.2 oz (103.5 kg)   Constitutional: overweight, in NAD Eyes: PERRLA, EOMI, no exophthalmos ENT: moist mucous membranes, no thyromegaly, no cervical  lymphadenopathy Cardiovascular: RRR, No MRG, + L LE swelling - chronic Respiratory: CTA B Musculoskeletal: no deformities, strength intact in all 4 Skin: moist, warm, no rashes Neurological: + mild tremor with outstretched hands, DTR normal in all 4  ASSESSMENT: 1. DM2, non-insulin-dependent, uncontrolled, without long-term complications, but with hyperglycemia  No family history of medullary thyroid cancer or personal history of pancreatitis.  2.   Hypothyroidism  3. HL  PLAN:  1. Patient with longstanding, uncontrolled, type 2 diabetes, on oral antidiabetic regimen with Wilder Glade and also weekly GLP-1 receptor agonist, which we increased at last visit.  At that time, she was also on glimepiride and I advised her to try to taper this to off.  At that time, HbA1c was 7.7%, higher, however, in the previous 2 weeks, per review of her CGM downloads, the predicted HbA1c was 8.9%.  Sugars were much higher than before, decreasing overnight but only to a nadir of approximately 160, and then increasing significantly after breakfast and even more after dinner.  She had a busy summer before last visit, initially spending time at her father-in-law was cooking for them and then traveling through the country.  Since she was tolerating Ozempic well, I advised her to increase to 1 mg weekly.  At that time, she was planning to go back to her previous diet. -At this visit, she is her CGM as she could not afford it.  Also, she missed the last month of Ozempic, restarted it last night.  She mentions that she could not afford the CGM and the Ozempic but now, after the first of the year, she is planning to restart the CGM and she already restarted Ozempic.  HbA1c is higher (see below), but for now I advised her to continue the current regimen and may need to intensify it if sugars are not improved at next visit. - I suggested to:  Patient Instructions  Please continue: - Faxiga 10 mg before b'fast - Glimepiride 8 mg before b'fast - Ozempic 1 mg weekly in a.m.  Please return in 3-4 months with your sugar log.   - we checked her HbA1c: 8.8% (much higher) - advised to check sugars at different times of the day - 4x a day, rotating check times - advised for yearly eye exams >> she is UTD - return to clinic in 3-4 months   2.  Hypothyroidism -She was previously on Synthroid d.a.w. for several years, however, she was able to come off as her TFTs normalized.  She was  then started on liothyronine 10 mcg daily after her TSH returned slightly high x2 in 02/2020: 4.95 and 4.57, respectively. -She did not feel different after starting liothyronine.  After discussion about the standard treatment for hypothyroidism with levothyroxine, and not liothyronine, and also the side effect of liothyronine especially at higher doses, she agreed to taper the liothyronine down, to off.  Her TFTs normalized after coming off the hormone so we did not have to start levothyroxine - last TSH was normal:  Lab Results  Component Value Date   TSH 2.79 10/28/2020  -We will continue to follow her expectantly  3. HL -Reviewed latest lipid panel from 02/2020: LDL at goal, triglycerides slightly high, HDL low: Lab Results  Component Value Date   CHOL 120 10/28/2020   HDL 33.40 (L) 10/28/2020   LDLCALC 53 10/28/2020   LDLDIRECT 69.0 03/12/2020   TRIG 168.0 (H) 10/28/2020   CHOLHDL 4 10/28/2020  -She continues on Lipitor 40 mg daily, fenofibrate  134 mg daily, fish oil but we stopped colesevelam after she ran out, as her triglycerides were elevated -She has no side effects from the above medications  Philemon Kingdom, MD PhD [facility] Endocrinology

## 2021-02-03 NOTE — Patient Instructions (Addendum)
Please continue: - Faxiga 10 mg before b'fast - Glimepiride 8 mg before b'fast  - Ozempic 1 mg weekly in a.m.  Please return in 3-4 months .

## 2021-03-02 ENCOUNTER — Other Ambulatory Visit: Payer: Self-pay | Admitting: *Deleted

## 2021-03-02 MED ORDER — VENLAFAXINE HCL ER 75 MG PO CP24: 225.0000 mg | ORAL_CAPSULE | Freq: Every day | ORAL | 0 refills | Status: DC

## 2021-04-02 ENCOUNTER — Ambulatory Visit: Payer: BC Managed Care – PPO | Admitting: Family Medicine

## 2021-04-02 ENCOUNTER — Other Ambulatory Visit: Payer: Self-pay

## 2021-04-02 ENCOUNTER — Encounter: Payer: Self-pay | Admitting: Family Medicine

## 2021-04-02 VITALS — BP 118/70 | HR 77 | Temp 98.0°F | Ht 62.0 in | Wt 223.0 lb

## 2021-04-02 DIAGNOSIS — E785 Hyperlipidemia, unspecified: Secondary | ICD-10-CM

## 2021-04-02 DIAGNOSIS — Z8481 Family history of carrier of genetic disease: Secondary | ICD-10-CM

## 2021-04-02 DIAGNOSIS — F5105 Insomnia due to other mental disorder: Secondary | ICD-10-CM | POA: Diagnosis not present

## 2021-04-02 DIAGNOSIS — R41840 Attention and concentration deficit: Secondary | ICD-10-CM

## 2021-04-02 DIAGNOSIS — Z23 Encounter for immunization: Secondary | ICD-10-CM

## 2021-04-02 DIAGNOSIS — Z1159 Encounter for screening for other viral diseases: Secondary | ICD-10-CM | POA: Diagnosis not present

## 2021-04-02 DIAGNOSIS — Z0000 Encounter for general adult medical examination without abnormal findings: Secondary | ICD-10-CM | POA: Diagnosis not present

## 2021-04-02 DIAGNOSIS — F418 Other specified anxiety disorders: Secondary | ICD-10-CM

## 2021-04-02 DIAGNOSIS — E1169 Type 2 diabetes mellitus with other specified complication: Secondary | ICD-10-CM

## 2021-04-02 DIAGNOSIS — Z1231 Encounter for screening mammogram for malignant neoplasm of breast: Secondary | ICD-10-CM | POA: Diagnosis not present

## 2021-04-02 DIAGNOSIS — E039 Hypothyroidism, unspecified: Secondary | ICD-10-CM

## 2021-04-02 DIAGNOSIS — F99 Mental disorder, not otherwise specified: Secondary | ICD-10-CM

## 2021-04-02 LAB — COMPREHENSIVE METABOLIC PANEL
ALT: 22 U/L (ref 0–35)
AST: 18 U/L (ref 0–37)
Albumin: 3.9 g/dL (ref 3.5–5.2)
Alkaline Phosphatase: 85 U/L (ref 39–117)
BUN: 6 mg/dL (ref 6–23)
CO2: 29 mEq/L (ref 19–32)
Calcium: 8.8 mg/dL (ref 8.4–10.5)
Chloride: 102 mEq/L (ref 96–112)
Creatinine, Ser: 0.58 mg/dL (ref 0.40–1.20)
GFR: 110.87 mL/min (ref >60.00)
Glucose, Bld: 176 mg/dL — ABNORMAL HIGH (ref 70–99)
Potassium: 3.3 mEq/L — ABNORMAL LOW (ref 3.5–5.1)
Sodium: 140 mEq/L (ref 135–145)
Total Bilirubin: 1.1 mg/dL (ref 0.2–1.2)
Total Protein: 5.7 g/dL — ABNORMAL LOW (ref 6.0–8.3)

## 2021-04-02 LAB — LIPID PANEL
Cholesterol: 89 mg/dL (ref 0–200)
HDL: 26.3 mg/dL — ABNORMAL LOW (ref >39.00)
NonHDL: 62.46
Total CHOL/HDL Ratio: 3
Triglycerides: 251 mg/dL — ABNORMAL HIGH (ref 0.0–149.0)
VLDL: 50.2 mg/dL — ABNORMAL HIGH (ref 0.0–40.0)

## 2021-04-02 LAB — CBC
HCT: 36.4 % (ref 36.0–46.0)
Hemoglobin: 12 g/dL (ref 12.0–15.0)
MCHC: 33.1 g/dL (ref 30.0–36.0)
MCV: 81.1 fl (ref 78.0–100.0)
Platelets: 229 10*3/uL (ref 150.0–400.0)
RBC: 4.48 Mil/uL (ref 3.87–5.11)
RDW: 16.5 % — ABNORMAL HIGH (ref 11.5–15.5)
WBC: 8.7 10*3/uL (ref 4.0–10.5)

## 2021-04-02 LAB — LDL CHOLESTEROL, DIRECT: Direct LDL: 34 mg/dL

## 2021-04-02 LAB — TSH: TSH: 3.63 u[IU]/mL (ref 0.35–5.50)

## 2021-04-02 LAB — MICROALBUMIN / CREATININE URINE RATIO
Creatinine,U: 56.6 mg/dL
Microalb Creat Ratio: 1.2 mg/g (ref 0.0–30.0)
Microalb, Ur: <0.7 mg/dL (ref 0.0–1.9)

## 2021-04-02 MED ORDER — ATORVASTATIN CALCIUM 40 MG PO TABS: 40.0000 mg | ORAL_TABLET | Freq: Every day | ORAL | 3 refills | Status: DC

## 2021-04-02 MED ORDER — AMPHETAMINE-DEXTROAMPHETAMINE 10 MG PO TABS: ORAL_TABLET | ORAL | 0 refills | Status: DC

## 2021-04-02 MED ORDER — VENLAFAXINE HCL ER 75 MG PO CP24: 225.0000 mg | ORAL_CAPSULE | Freq: Every day | ORAL | 1 refills | Status: DC

## 2021-04-02 MED ORDER — FENOFIBRATE MICRONIZED 134 MG PO CAPS: 134.0000 mg | ORAL_CAPSULE | Freq: Every day | ORAL | 3 refills | Status: DC

## 2021-04-02 NOTE — Patient Instructions (Signed)
°Great to see you today.  °I have refilled the medication(s) we provide.  ° °If labs were collected, we will inform you of lab results once received either by echart message or telephone call.  ° - echart message- for normal results that have been seen by the patient already.  ° - telephone call: abnormal results or if patient has not viewed results in their echart. ° °Health Maintenance, Female °Adopting a healthy lifestyle and getting preventive care are important in promoting health and wellness. Ask your health care provider about: °The right schedule for you to have regular tests and exams. °Things you can do on your own to prevent diseases and keep yourself healthy. °What should I know about diet, weight, and exercise? °Eat a healthy diet ° °Eat a diet that includes plenty of vegetables, fruits, low-fat dairy products, and lean protein. °Do not eat a lot of foods that are high in solid fats, added sugars, or sodium. °Maintain a healthy weight °Body mass index (BMI) is used to identify weight problems. It estimates body fat based on height and weight. Your health care provider can help determine your BMI and help you achieve or maintain a healthy weight. °Get regular exercise °Get regular exercise. This is one of the most important things you can do for your health. Most adults should: °Exercise for at least 150 minutes each week. The exercise should increase your heart rate and make you sweat (moderate-intensity exercise). °Do strengthening exercises at least twice a week. This is in addition to the moderate-intensity exercise. °Spend less time sitting. Even light physical activity can be beneficial. °Watch cholesterol and blood lipids °Have your blood tested for lipids and cholesterol at 44 years of age, then have this test every 5 years. °Have your cholesterol levels checked more often if: °Your lipid or cholesterol levels are high. °You are older than 44 years of age. °You are at high risk for heart  disease. °What should I know about cancer screening? °Depending on your health history and family history, you may need to have cancer screening at various ages. This may include screening for: °Breast cancer. °Cervical cancer. °Colorectal cancer. °Skin cancer. °Lung cancer. °What should I know about heart disease, diabetes, and high blood pressure? °Blood pressure and heart disease °High blood pressure causes heart disease and increases the risk of stroke. This is more likely to develop in people who have high blood pressure readings or are overweight. °Have your blood pressure checked: °Every 3-5 years if you are 18-39 years of age. °Every year if you are 40 years old or older. °Diabetes °Have regular diabetes screenings. This checks your fasting blood sugar level. Have the screening done: °Once every three years after age 40 if you are at a normal weight and have a low risk for diabetes. °More often and at a younger age if you are overweight or have a high risk for diabetes. °What should I know about preventing infection? °Hepatitis B °If you have a higher risk for hepatitis B, you should be screened for this virus. Talk with your health care provider to find out if you are at risk for hepatitis B infection. °Hepatitis C °Testing is recommended for: °Everyone born from 1945 through 1965. °Anyone with known risk factors for hepatitis C. °Sexually transmitted infections (STIs) °Get screened for STIs, including gonorrhea and chlamydia, if: °You are sexually active and are younger than 44 years of age. °You are older than 44 years of age and your health care provider   tells you that you are at risk for this type of infection. °Your sexual activity has changed since you were last screened, and you are at increased risk for chlamydia or gonorrhea. Ask your health care provider if you are at risk. °Ask your health care provider about whether you are at high risk for HIV. Your health care provider may recommend a  prescription medicine to help prevent HIV infection. If you choose to take medicine to prevent HIV, you should first get tested for HIV. You should then be tested every 3 months for as long as you are taking the medicine. °Pregnancy °If you are about to stop having your period (premenopausal) and you may become pregnant, seek counseling before you get pregnant. °Take 400 to 800 micrograms (mcg) of folic acid every day if you become pregnant. °Ask for birth control (contraception) if you want to prevent pregnancy. °Osteoporosis and menopause °Osteoporosis is a disease in which the bones lose minerals and strength with aging. This can result in bone fractures. If you are 65 years old or older, or if you are at risk for osteoporosis and fractures, ask your health care provider if you should: °Be screened for bone loss. °Take a calcium or vitamin D supplement to lower your risk of fractures. °Be given hormone replacement therapy (HRT) to treat symptoms of menopause. °Follow these instructions at home: °Alcohol use °Do not drink alcohol if: °Your health care provider tells you not to drink. °You are pregnant, may be pregnant, or are planning to become pregnant. °If you drink alcohol: °Limit how much you have to: °0-1 drink a day. °Know how much alcohol is in your drink. In the [place] one drink equals one 12 oz bottle of beer (355 mL), one 5 oz glass of wine (148 mL), or one 1½ oz glass of hard liquor (44 mL). °Lifestyle °Do not use any products that contain nicotine or tobacco. These products include cigarettes, chewing tobacco, and vaping devices, such as e-cigarettes. If you need help quitting, ask your health care provider. °Do not use street drugs. °Do not share needles. °Ask your health care provider for help if you need support or information about quitting drugs. °General instructions °Schedule regular health, dental, and eye exams. °Stay current with your vaccines. °Tell your health care provider if: °You often  feel depressed. °You have ever been abused or do not feel safe at home. °Summary °Adopting a healthy lifestyle and getting preventive care are important in promoting health and wellness. °Follow your health care provider's instructions about healthy diet, exercising, and getting tested or screened for diseases. °Follow your health care provider's instructions on monitoring your cholesterol and blood pressure. °This information is not intended to replace advice given to you by your health care provider. Make sure you discuss any questions you have with your health care provider. °Document Revised: 06/02/2020 Document Reviewed: 06/02/2020 °Elsevier Patient Education © 2022 [employer] ° °

## 2021-04-02 NOTE — Progress Notes (Addendum)
This visit occurred during the SARS-CoV-2 public health emergency.  Safety protocols were in place, including screening questions prior to the visit, additional usage of staff PPE, and extensive cleaning of exam room while observing appropriate contact time as indicated for disinfecting solutions.    Patient ID: Melissa Gray, female  DOB: 10/01/1975, 46 y.o.   MRN: [id number] Patient Care Team    Relationship Specialty Notifications Start End  Ma Hillock, DO PCP - General Family Medicine  06/03/15   Lavena Bullion, DO Consulting Physician Gastroenterology  11/16/17   Nunzio Cobbs, MD Consulting Physician Obstetrics and Gynecology  03/28/20     Chief Complaint  Patient presents with   Annual Exam    Pt is fasting    Subjective: Melissa Gray is a 46 y.o.  Female  present for [place] All past medical history, surgical history, allergies, family history, immunizations, medications and social history were updated in the electronic medical record today. All recent labs, ED visits and hospitalizations within the last year were reviewed.  Health maintenance:  Colonoscopy: No FHX, routine screening at age 67 Mammogram: [place] present aunt was positive for BRCA-->08/2020- [facility] placed this years order for her Cervical cancer screening:2015, no abnl pap. Completed today. Dr. Quincy Simmonds Immunizations:  tdap- updated today, flu UTD. PNA UTD, covid UTD Infectious disease screening: HIV and Hep C completed 2007 DEXA: routine screen at 69. Patient has a Dental home. Hospitalizations/ED visits: reviewed  type 2 diabetes mellitus with hyperlipidemia/)/Morbid obesity ([place]) Diabetes now managed by endocrine.  Patient reports compliance  with Lipitor, omega-3 and fenofibrate   Depression with anxiety/insomnia/attention deficit Patient reports she feels the Effexor to 225 mg is working well for her.  She did have a few episodes surrounding her menstrual cycle where she felt she  was more moody.  However that has resolved.  The trazodone she has not used often and prefers to try melatonin, but does use sometimes..  She states if the melatonin is not effective she may take 1/2-1 tab of the trazodone nightly.  Patient reports she has been seeing a therapist.       ADD: Pt reports compliance with adderral 5 mg once a day- sometimes BID. She feels it is definitely working well for her. Prior note: Pt presents for an OV to discuss her attention and concentration difficulty. She reports onset as young as first grade. Unfortunately she was never diagnosed at that time. She recently was seen by a psychologist who completed an ADHD evaluation on her and it was positive. She would like to discuss medication start today. ADHD screening tool completed today and positive  Depression screen [facility] 2/9 04/02/2021 10/28/2020 03/28/2020 03/12/2020 12/12/2019  Decreased Interest _0 0 1  Down, Depressed, Hopeless 0 _1 PHQ - 2 Score _2 Altered sleeping _3 Tired, decreased energy _4 Change in appetite _5 0 0  Feeling bad or failure about yourself  _6 Trouble concentrating _7 Moving slowly or fidgety/restless 0 0 1 1 0  Suicidal thoughts 0 0 0 0 0  PHQ-9 Score _8 Difficult doing work/chores - - - - -  Some recent data might be hidden   GAD 7 : Generalized Anxiety Score 04/02/2021 10/28/2020 03/28/2020 03/12/2020  Nervous, Anxious, on Edge 2 1 1  1  Control/stop worrying _0 Worry too much - different things _1 Trouble relaxing _2 Restless _3 Easily annoyed or irritable _4 Afraid - awful might happen 0 _5 Total GAD 7 Score _6 Anxiety Difficulty - - - Somewhat difficult   Immunization History  Administered Date(s) Administered   Influenza Inj Mdck Quad Pf 11/18/2017   Influenza,inj,Quad PF,6+ Mos 11/29/2016, 02/02/2019, 09/27/2019, 10/28/2020   Influenza-Unspecified 12/01/2015, 11/29/2016    PFIZER(Purple Top)SARS-COV-2 Vaccination 04/10/2019, 05/01/2019, 11/01/2019, 10/04/2020   Pneumococcal Conjugate-13 07/02/2015   Pneumococcal Polysaccharide-23 06/17/2016   Tdap 04/02/2021   Past Medical History:  Diagnosis Date   Anxiety    COVID-19 07/2018   Depression    Diabetes mellitus without complication ([place])    Elevated cholesterol    Gallstones    GERD (gastroesophageal reflux disease)    Hematochezia 10/2017   Plan for colonoscopy   Hyperplastic colon polyp 11/2017   IBS (irritable bowel syndrome)    diarrhea predominant   Infertility, female    Obesity    Thyroid disease 2004   treated for years, stopped 2014 per new MD   Allergies  Allergen Reactions   Tape Other (See Comments)    Adhesive tape causes blisters   Levothyroxine Rash    Generic brand only caused rash   Past Surgical History:  Procedure Laterality Date   CHOLECYSTECTOMY  2006   CYST EXCISION     GLOMUS TUMOR EXCISION  2005, 2012   finger and cheek   Family History  Problem Relation Age of Onset   Diabetes Mother    Depression Mother    Obesity Mother    COPD Father    Heart disease Father    Early death Father    Diabetes Father    Obesity Father    Diabetes Brother    Diabetes Maternal Grandmother    Hearing loss Maternal Grandmother    Dementia Maternal Grandmother    Early death Maternal Grandfather    Cancer Maternal Grandfather    Breast cancer Paternal Grandmother    Cancer Paternal Grandmother    Early death Paternal Grandmother    Heart disease Paternal Grandfather    Breast cancer Paternal Aunt    Heart disease Paternal Uncle    Heart attack Paternal Uncle    Breast cancer Paternal Aunt    Alcohol abuse Paternal Aunt    Alzheimer's disease Paternal Aunt    Alcohol abuse Paternal Aunt    Cancer Maternal Aunt    Obesity Maternal Aunt    Colon cancer Neg Hx    Esophageal cancer Neg Hx    Rectal cancer Neg Hx    Stomach cancer Neg Hx    Social History   Social  History Narrative   Married. Husband's name is Audelia Acton. No children.   2 pets.   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   Drinks caffeine   Wears her seatbelt   Smoke detector in home   Safe in her relationship.    Allergies as of 04/02/2021       Reactions   Tape Other (See Comments)   Adhesive tape causes blisters   Levothyroxine Rash   Generic brand only caused rash        Medication List        Accurate as of April 02, 2021  8:52 AM.  If you have any questions, ask your nurse or doctor.          STOP taking these medications    traZODone 50 MG tablet Commonly known as: DESYREL Stopped by: Howard Pouch, DO       TAKE these medications    amphetamine-dextroamphetamine 10 MG tablet Commonly known as: Adderall 5 mg PO BID   amphetamine-dextroamphetamine 10 MG tablet Commonly known as: ADDERALL 5 mg twice daily   atorvastatin 40 MG tablet Commonly known as: LIPITOR Take 1 tablet (40 mg total) by mouth daily.   dapagliflozin propanediol 10 MG Tabs tablet Commonly known as: Farxiga Take 1 tablet (10 mg total) by mouth daily before breakfast.   fenofibrate micronized 134 MG capsule Commonly known as: LOFIBRA Take 1 capsule (134 mg total) by mouth daily.   FISH OIL PO Take 3,000 mg by mouth.   FreeStyle Libre 3 Sensor Misc 1 each by Does not apply route every 14 (fourteen) days.   glimepiride 4 MG tablet Commonly known as: AMARYL Take 2 tablets (8 mg total) by mouth every morning.   MULTIVITAMIN ADULT PO Take by mouth.   Ozempic (1 MG/DOSE) 4 MG/3ML Sopn Generic drug: Semaglutide (1 MG/DOSE) Inject 1 mg into the skin once a week.   venlafaxine XR 75 MG 24 hr capsule Commonly known as: Effexor XR Take 3 capsules (225 mg total) by mouth daily with breakfast.        All past medical history, surgical history, allergies, family history, immunizations andmedications were updated in the EMR today and reviewed under the history  and medication portions of their EMR.     Recent Results (from the past 2160 hour(s))  POCT glycosylated hemoglobin (Hb A1C)     Status: Abnormal   Collection Time: 02/03/21  8:15 AM  Result Value Ref Range   Hemoglobin A1C 8.8 (A) 4.0 - 5.6 %   HbA1c POC (<> result, manual entry)     HbA1c, POC (prediabetic range)     HbA1c, POC (controlled diabetic range)      MM 3D SCREEN BREAST BILATERAL Result Date: 09/28/2020 (Code:SM-B-01Y) BI-RADS CATEGORY  1: Negative. Electronically Signed   By: Abelardo Diesel M.D.   On: 09/28/2020 16:42   ROS 14 pt review of systems performed and negative (unless mentioned in an HPI)  Objective: BP 118/70    Pulse 77    Temp 98 F (36.7 C) (Oral)    Ht _0  (1.575 m)    Wt 223 lb (101.2 kg)    SpO2 97%    BMI 40.79 kg/m  Physical Exam Vitals and nursing note reviewed.  Constitutional:      General: She is not in acute distress.    Appearance: Normal appearance. She is obese. She is not ill-appearing or toxic-appearing.  HENT:     Head: Normocephalic and atraumatic.     Right Ear: Tympanic membrane, ear canal and external ear normal. There is no impacted cerumen.     Left Ear: Tympanic membrane, ear canal and external ear normal. There is no impacted cerumen.     Nose: No congestion or rhinorrhea.     Mouth/Throat:     Mouth: Mucous membranes are moist.     Pharynx: Oropharynx is clear. No oropharyngeal exudate or posterior oropharyngeal erythema.  Eyes:     General: No scleral icterus.       Right eye: No discharge.        Left eye: No discharge.  Extraocular Movements: Extraocular movements intact.     Conjunctiva/sclera: Conjunctivae normal.     Pupils: Pupils are equal, round, and reactive to light.  Cardiovascular:     Rate and Rhythm: Normal rate and regular rhythm.     Pulses: Normal pulses.     Heart sounds: Normal heart sounds. No murmur heard.   No friction rub. No gallop.  Pulmonary:     Effort: Pulmonary effort is normal. No  respiratory distress.     Breath sounds: Normal breath sounds. No stridor. No wheezing, rhonchi or rales.  Chest:     Chest wall: No tenderness.  Abdominal:     General: Abdomen is flat. Bowel sounds are normal. There is no distension.     Palpations: Abdomen is soft. There is no mass.     Tenderness: There is no abdominal tenderness. There is no right CVA tenderness, left CVA tenderness, guarding or rebound.     Hernia: No hernia is present.  Musculoskeletal:        General: No swelling, tenderness or deformity. Normal range of motion.     Cervical back: Normal range of motion and neck supple. No rigidity or tenderness.     Right lower leg: No edema.     Left lower leg: No edema.  Lymphadenopathy:     Cervical: No cervical adenopathy.  Skin:    General: Skin is warm and dry.     Coloration: Skin is not jaundiced or pale.     Findings: No bruising, erythema, lesion or rash.  Neurological:     General: No focal deficit present.     Mental Status: She is alert and oriented to person, place, and time. Mental status is at baseline.     Cranial Nerves: No cranial nerve deficit.     Sensory: No sensory deficit.     Motor: No weakness.     Coordination: Coordination normal.     Gait: Gait normal.     Deep Tendon Reflexes: Reflexes normal.  Psychiatric:        Mood and Affect: Mood normal.        Behavior: Behavior normal.        Thought Content: Thought content normal.        Judgment: Judgment normal.     No results found.  Assessment/plan: Melissa Gray is a 46 y.o. female present for CPE/CMC Uncontrolled type 2 diabetes mellitus without complication, without long-term current use of insulin ([place]) Morbid obesity ([place]) - DM managed by endocrine.  -Patient prescribed statin - Eye exam  03/2021 UTD - Foot exam completed 10/28/2020 - Prevnar completed - flu shot completed - microalbumin collected today - a1c 8.3 (01/2021)   Depression anxiety/insomnia/focus deficit: -  stable. Continue  Effexor  225 mg daily (three 75 mg tabs)  - not using trazodone much.  -has been Referred to therapist     Hyperlipidemia/hypertriglyceridemia/On statin therapy -Continue omega-3 fatty acid -continue  fenofibrate 134 mg daily -continue  Lipitor 40 mg daily CBC, CMP, TSH, Lipids collected today   Attention and concentration deficit Stable.  Continue Adderall 5 mg daily twice daily as needed [place] controlled substance database was reviewed 04/02/21    Hypothyroidism: Currently not taking medications and has had normal TSH off medications.  TSH collected today   FH: BRCA gene positive/Breast cancer screening by mammogram - MM 3D SCREEN BREAST BILATERAL; Future  Need for Tdap vaccination - Tdap vaccine greater than or equal to 7yo IM Routine general medical  examination at a health care facility Colonoscopy: No FHX, routine screening at age 7 Mammogram: [place] present aunt was positive for BRCA-->08/2020- [facility] placed this years order for her Cervical cancer screening:2015, no abnl pap. Completed today. Dr. Quincy Simmonds Immunizations:  tdap- updated today, flu UTD. PNA UTD, covid UTD Infectious disease screening: HIV and Hep C completed 2007 DEXA: routine screen at 11. Patient was encouraged to exercise greater than 150 minutes a week. Patient was encouraged to choose a diet filled with fresh fruits and vegetables, and lean meats. AVS provided to patient today for education/recommendation on gender specific health and safety maintenance. Return in about 24 weeks (around 09/17/2021) for CMC (30 min).  Orders Placed This Encounter  Procedures   MM 3D SCREEN BREAST BILATERAL   Tdap vaccine greater than or equal to 7yo IM   CBC   Comprehensive metabolic panel   Lipid panel   TSH   Urine Microalbumin w/creat. ratio    Orders Placed This Encounter  Procedures   MM 3D SCREEN BREAST BILATERAL   Tdap vaccine greater than or equal to 7yo IM   CBC    Comprehensive metabolic panel   Lipid panel   TSH   Urine Microalbumin w/creat. ratio   Meds ordered this encounter  Medications   atorvastatin (LIPITOR) 40 MG tablet    Sig: Take 1 tablet (40 mg total) by mouth daily.    Dispense:  90 tablet    Refill:  3   venlafaxine XR (EFFEXOR XR) 75 MG 24 hr capsule    Sig: Take 3 capsules (225 mg total) by mouth daily with breakfast.    Dispense:  270 capsule    Refill:  1   fenofibrate micronized (LOFIBRA) 134 MG capsule    Sig: Take 1 capsule (134 mg total) by mouth daily.    Dispense:  90 capsule    Refill:  3   amphetamine-dextroamphetamine (ADDERALL) 10 MG tablet    Sig: 5 mg PO BID    Dispense:  90 tablet    Refill:  0   amphetamine-dextroamphetamine (ADDERALL) 10 MG tablet    Sig: 5 mg twice daily    Dispense:  90 tablet    Refill:  0    May refill approximately 85 days after prescribed   Referral Orders  No referral(s) requested today     Electronically signed by: Howard Pouch, [place]

## 2021-04-02 NOTE — Addendum Note (Signed)
Addended by: Howard Pouch A on: 04/02/2021 08:52 AM ? ? Modules accepted: Orders ? ?

## 2021-04-06 ENCOUNTER — Telehealth: Payer: Self-pay | Admitting: Family Medicine

## 2021-04-06 MED ORDER — POTASSIUM CHLORIDE CRYS ER 20 MEQ PO TBCR: 20.0000 meq | EXTENDED_RELEASE_TABLET | Freq: Two times a day (BID) | ORAL | 0 refills | Status: DC

## 2021-04-06 NOTE — Telephone Encounter (Signed)
Please call patient ?Liver, kidney and thyroid function are normal ?Blood cell counts and electrolytes are normal ?Overall her cholesterol panel was good.  Her triglycerides are still mildly elevated, however increasing fiber in her diet will help. ?Her urine protein levels were normal. ? ?Potassium is mildly decreased.  I have called in a potassium supplement for her to take over the next couple of days.  After supplement she should try to increase the potassium in her diet to maintain levels - Bananas, oranges, cantaloupe, grapefruit ,prunes, raisins, and dates,Cooked spinach., Cooked broccoli.,Potatoes.,Sweet potatoes,Mushrooms, Peas,Cucumbers are all good sources. ? ? ?

## 2021-04-06 NOTE — Telephone Encounter (Signed)
LVM for pt to CB regarding results.  

## 2021-04-07 NOTE — Telephone Encounter (Signed)
LVM for pt to CB regarding results.  

## 2021-04-08 NOTE — Telephone Encounter (Signed)
LVM for pt to CB regarding results.  

## 2021-05-06 ENCOUNTER — Ambulatory Visit: Payer: BC Managed Care – PPO | Admitting: Internal Medicine

## 2021-05-13 ENCOUNTER — Ambulatory Visit: Payer: BC Managed Care – PPO | Admitting: Family Medicine

## 2021-05-13 ENCOUNTER — Ambulatory Visit: Payer: BC Managed Care – PPO | Admitting: Internal Medicine

## 2021-05-13 ENCOUNTER — Encounter: Payer: Self-pay | Admitting: Family Medicine

## 2021-05-13 VITALS — BP 131/77 | HR 122 | Temp 99.3°F | Ht 62.0 in | Wt 220.0 lb

## 2021-05-13 DIAGNOSIS — B9689 Other specified bacterial agents as the cause of diseases classified elsewhere: Secondary | ICD-10-CM

## 2021-05-13 DIAGNOSIS — J988 Other specified respiratory disorders: Secondary | ICD-10-CM

## 2021-05-13 DIAGNOSIS — R051 Acute cough: Secondary | ICD-10-CM

## 2021-05-13 MED ORDER — FLUTICASONE PROPIONATE 50 MCG/ACT NA SUSP: 2.0000 | Freq: Every day | NASAL | 6 refills | Status: DC

## 2021-05-13 MED ORDER — IPRATROPIUM BROMIDE 0.06 % NA SOLN: 2.0000 | Freq: Four times a day (QID) | NASAL | 12 refills | Status: DC

## 2021-05-13 MED ORDER — HYDROCODONE BIT-HOMATROP MBR 5-1.5 MG/5ML PO SOLN: 5.0000 mL | Freq: Every day | ORAL | 0 refills | Status: DC

## 2021-05-13 MED ORDER — AMOXICILLIN-POT CLAVULANATE 875-125 MG PO TABS: 1.0000 | ORAL_TABLET | Freq: Two times a day (BID) | ORAL | 0 refills | Status: DC

## 2021-05-13 MED ORDER — BENZONATATE 200 MG PO CAPS: 200.0000 mg | ORAL_CAPSULE | Freq: Two times a day (BID) | ORAL | 0 refills | Status: DC | PRN

## 2021-05-13 NOTE — Progress Notes (Signed)
? ? ? ?This visit occurred during the SARS-CoV-2 public health emergency.  Safety protocols were in place, including screening questions prior to the visit, additional usage of staff PPE, and extensive cleaning of exam room while observing appropriate contact time as indicated for disinfecting solutions.  ? ? ?Bonnell Public , 08/26/1975, 46 y.o., female ?MRN: [id number] ?Patient Care Team  ?  Relationship Specialty Notifications Start End  ?Ma Hillock, DO PCP - General Family Medicine  06/03/15   ?Lavena Bullion, DO Consulting Physician Gastroenterology  11/16/17   ?Nunzio Cobbs, MD Consulting Physician Obstetrics and Gynecology  03/28/20   ? ? ?Chief Complaint  ?Patient presents with  ? Cough  ?  Pt c/o cough, post nasal drip, nasal congestion, nasal drainage x 2 mos worsen in the last week;   ? ?  ?Subjective: Pt presents for an OV with complaints of cough, postnasal drip, nasal congestion and drainage, sinus pressure and headache of worsening over the last week, present greater than 2 weeks.  She reports low-grade fever.  Cough and drainage is keeping her up through the night.  She denies nausea or vomit.  She denies shortness of breath. ?She took a COVID test on 2 occasions throughout the illness and both were negative. ? ?  04/02/2021  ?  8:10 AM 10/28/2020  ?  9:06 AM 03/28/2020  ?  1:10 PM 03/12/2020  ?  8:43 AM 12/12/2019  ?  8:08 AM  ?Depression screen PHQ 2/9  ?Decreased Interest '1 1 1 '$ 0 1  ?Down, Depressed, Hopeless 0 '1 1 1 2  '$ ?PHQ - 2 Score '1 2 2 1 3  '$ ?Altered sleeping '2 2 2 2 3  '$ ?Tired, decreased energy '1 1 2 1 1  '$ ?Change in appetite '1 1 1 '$ 0 0  ?Feeling bad or failure about yourself  '1 1 1 1 2  '$ ?Trouble concentrating '1 1 1 1 1  '$ ?Moving slowly or fidgety/restless 0 0 1 1 0  ?Suicidal thoughts 0 0 0 0 0  ?PHQ-9 Score '7 8 10 7 10  '$ ? ? ?Allergies  ?Allergen Reactions  ? Tape Other (See Comments)  ?  Adhesive tape causes blisters  ? Levothyroxine Rash  ?  Generic brand only caused rash   ? ?Social History  ? ?Social History Narrative  ? Married. Husband's name is Audelia Acton. No children.  ? 2 pets.  ? 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  ? Drinks caffeine  ? Wears her seatbelt  ? Smoke detector in home  ? Safe in her relationship.  ? ?Past Medical History:  ?Diagnosis Date  ? Anxiety   ? COVID-19 07/2018  ? Depression   ? Diabetes mellitus without complication ([place])   ? Elevated cholesterol   ? Gallstones   ? GERD (gastroesophageal reflux disease)   ? Hematochezia 10/2017  ? Plan for colonoscopy  ? Hyperplastic colon polyp 11/2017  ? IBS (irritable bowel syndrome)   ? diarrhea predominant  ? Infertility, female   ? Obesity   ? Thyroid disease 2004  ? treated for years, stopped 2014 per new MD  ? ?Past Surgical History:  ?Procedure Laterality Date  ? CHOLECYSTECTOMY  2006  ? CYST EXCISION    ? GLOMUS TUMOR EXCISION  2005, 2012  ? finger and cheek  ? ?Family History  ?Problem Relation Age of Onset  ? Diabetes Mother   ? Depression Mother   ? Obesity Mother   ? COPD Father   ?  Heart disease Father   ? Early death Father   ? Diabetes Father   ? Obesity Father   ? Diabetes Brother   ? Diabetes Maternal Grandmother   ? Hearing loss Maternal Grandmother   ? Dementia Maternal Grandmother   ? Early death Maternal Grandfather   ? Cancer Maternal Grandfather   ? Breast cancer Paternal Grandmother   ? Cancer Paternal Grandmother   ? Early death Paternal Grandmother   ? Heart disease Paternal Grandfather   ? Breast cancer Paternal Aunt   ? Heart disease Paternal Uncle   ? Heart attack Paternal Uncle   ? Breast cancer Paternal Aunt   ? Alcohol abuse Paternal Aunt   ? Alzheimer's disease Paternal Aunt   ? Alcohol abuse Paternal Aunt   ? Cancer Maternal Aunt   ? Obesity Maternal Aunt   ? Colon cancer Neg Hx   ? Esophageal cancer Neg Hx   ? Rectal cancer Neg Hx   ? Stomach cancer Neg Hx   ? ?Allergies as of 05/13/2021   ? ?   Reactions  ? Tape Other (See Comments)  ? Adhesive tape causes blisters  ?  Levothyroxine Rash  ? Generic brand only caused rash  ? ?  ? ?  ?Medication List  ?  ? ?  ? Accurate as of May 13, 2021  4:11 PM. If you have any questions, ask your nurse or doctor.  ?  ?  ? ?  ? ?amoxicillin-clavulanate 875-125 MG tablet ?Commonly known as: AUGMENTIN ?Take 1 tablet by mouth 2 (two) times daily. ?Started by: Howard Pouch, DO ?  ?amphetamine-dextroamphetamine 10 MG tablet ?Commonly known as: Adderall ?5 mg PO BID ?  ?amphetamine-dextroamphetamine 10 MG tablet ?Commonly known as: ADDERALL ?5 mg twice daily ?  ?atorvastatin 40 MG tablet ?Commonly known as: LIPITOR ?Take 1 tablet (40 mg total) by mouth daily. ?  ?benzonatate 200 MG capsule ?Commonly known as: TESSALON ?Take 1 capsule (200 mg total) by mouth 2 (two) times daily as needed for cough. ?Started by: Howard Pouch, DO ?  ?dapagliflozin propanediol 10 MG Tabs tablet ?Commonly known as: [place] ?Take 1 tablet (10 mg total) by mouth daily before breakfast. ?  ?fenofibrate micronized 134 MG capsule ?Commonly known as: LOFIBRA ?Take 1 capsule (134 mg total) by mouth daily. ?  ?FISH OIL PO ?Take 3,000 mg by mouth. ?  ?fluticasone 50 MCG/ACT nasal spray ?Commonly known as: FLONASE ?Place 2 sprays into both nostrils daily. ?Started by: Howard Pouch, DO ?  ?FreeStyle Libre 3 Sensor Misc ?1 each by Does not apply route every 14 (fourteen) days. ?  ?glimepiride 4 MG tablet ?Commonly known as: AMARYL ?Take 2 tablets (8 mg total) by mouth every morning. ?  ?HYDROcodone bit-homatropine 5-1.5 MG/5ML syrup ?Commonly known as: HYCODAN ?Take 5 mLs by mouth at bedtime. ?Started by: Howard Pouch, DO ?  ?ipratropium 0.06 % nasal spray ?Commonly known as: ATROVENT ?Place 2 sprays into both nostrils 4 (four) times daily. ?Started by: Howard Pouch, DO ?  ?MULTIVITAMIN ADULT PO ?Take by mouth. ?  ?Ozempic (1 MG/DOSE) 4 MG/3ML Sopn ?Generic drug: Semaglutide (1 MG/DOSE) ?Inject 1 mg into the skin once a week. ?  ?potassium chloride SA 20 MEQ tablet ?Commonly known  as: KLOR-CON M ?Take 1 tablet (20 mEq total) by mouth 2 (two) times daily for 6 doses. ?  ?venlafaxine XR 75 MG 24 hr capsule ?Commonly known as: Effexor XR ?Take 3 capsules (225 mg total) by mouth daily with  breakfast. ?  ? ?  ? ? ?All past medical history, surgical history, allergies, family history, immunizations andmedications were updated in the EMR today and reviewed under the history and medication portions of their EMR.    ? ?ROS ?Negative, with the exception of above mentioned in HPI ? ?Objective:  ?BP 131/77   Pulse (!) 122   Temp 99.3 ?F (37.4 ?C) (Oral)   Ht '5\' 2"'$  (1.575 m)   Wt 220 lb (99.8 kg)   SpO2 96%   BMI 40.24 kg/m?  ?Body mass index is 40.24 kg/m?Marland Kitchen ?Physical Exam ?Vitals and nursing note reviewed.  ?Constitutional:   ?   General: She is not in acute distress. ?   Appearance: Normal appearance. She is obese. She is not ill-appearing or toxic-appearing.  ?HENT:  ?   Head: Normocephalic and atraumatic.  ?   Right Ear: Tympanic membrane and ear canal normal.  ?   Left Ear: Tympanic membrane and ear canal normal.  ?   Nose: Mucosal edema, congestion and rhinorrhea present. Rhinorrhea is purulent.  ?   Right Sinus: Maxillary sinus tenderness and frontal sinus tenderness present.  ?   Left Sinus: Maxillary sinus tenderness and frontal sinus tenderness present.  ?   Mouth/Throat:  ?   Mouth: Mucous membranes are moist.  ?   Pharynx: Posterior oropharyngeal erythema present. No oropharyngeal exudate.  ?Eyes:  ?   Extraocular Movements: Extraocular movements intact.  ?   Conjunctiva/sclera: Conjunctivae normal.  ?   Pupils: Pupils are equal, round, and reactive to light.  ?Cardiovascular:  ?   Rate and Rhythm: Regular rhythm. Tachycardia present.  ?   Heart sounds: No murmur heard. ?Pulmonary:  ?   Effort: Pulmonary effort is normal. No respiratory distress.  ?   Breath sounds: No wheezing, rhonchi or rales.  ?   Comments: Harsh cough on exam. ?Musculoskeletal:  ?   Cervical back: Neck supple. No  tenderness.  ?Lymphadenopathy:  ?   Cervical: No cervical adenopathy.  ?Skin: ?   Findings: No rash.  ?Neurological:  ?   Mental Status: She is alert and oriented to person, place, and time. Mental status is at baseli

## 2021-05-13 NOTE — Progress Notes (Deleted)
Patient ID: Melissa Gray, female   DOB: Feb 07, 1975, 46 y.o.   MRN: [id number]   This visit occurred during the SARS-CoV-2 public health emergency.  Safety protocols were in place, including screening questions prior to the visit, additional usage of staff PPE, and extensive cleaning of exam room while observing appropriate contact time as indicated for disinfecting solutions.   HPI: Melissa Gray is a 46 y.o.-year-old female, initially referred by her PCP, Dr. Raoul Pitch, returning for f/u for DM2, dx in 2016, non-insulin-dependent, uncontrolled, without long-term complications but with hyperglycemia. Last OV 3 mo ago.  Interim hx: No nausea, chest pain.  She has hot flashes at night.  She also has increased urination.  No blurry vision.  DM2: Reviewed HbA1c: Lab Results  Component Value Date   HGBA1C 8.8 (A) 02/03/2021   HGBA1C 7.7 (A) 09/18/2020   HGBA1C 6.6 (A) 06/04/2020   HGBA1C 9.1 (A) 03/12/2020   HGBA1C 9.1 03/12/2020   HGBA1C 9.1 (A) 03/12/2020   HGBA1C 9.1 (A) 03/12/2020   HGBA1C 8.7 (A) 12/12/2019   HGBA1C 8.7 12/12/2019   HGBA1C 8.7 (A) 12/12/2019   HGBA1C 8.7 (A) 12/12/2019   Pt is on a regimen of: - Glimepiride 8 mg in a.m. before breakfast  - Ozempic 0.5 >> 1 mg weekly  - Farxiga 10 mg in the am before b'fast Tried Victoza >> pain at injection site - several years ago. She was previously on Rybelsus, but was not taking it consistently. We stopped Januvia when we switched to [facility] 03/2020. Please stop colesevelam twice a day due to increased triglycerides.    Pt is checking sugars 4x a day with her CGM - Freestyle Libre 2:  Previously:   Previously:   Lowest sugar was 70s >> >80 >> ?; she has hypoglycemia awareness at 70.  Highest sugar was 300 >> 260 (milkshakes) >> >350 >> ?.  Pt's meals are: (now [employer]) - Breakfast: 2 eggs with veggies or bagel w/ cream cheese or cereal, or skips - Lunch: sandwich - Dinner: takeout 1-2x a week, homecooked:  meat + veggies or salad + starch - Snacks: 1-2: icecream - maybe once a week (decreased cravings with Adderrall) She previously saw nutrition.  - no CKD, last BUN/creatinine:  Lab Results  Component Value Date   BUN 6 04/02/2021   BUN 12 03/12/2020   CREATININE 0.58 04/02/2021   CREATININE 0.75 03/12/2020  She is not on ARB or ACE inhibitor.  -+ HL; last set of lipids: Lab Results  Component Value Date   CHOL 89 04/02/2021   HDL 26.30 (L) 04/02/2021   LDLCALC 53 10/28/2020   LDLDIRECT 34.0 04/02/2021   TRIG 251.0 (H) 04/02/2021   CHOLHDL 3 04/02/2021  On Lipitor 40 mg daily, micronized fenofibrate 134 mg daily, fish oil.  We stopped colesevelam 05/2020.  - last eye exam was on 04/11/2020: No DR   - no numbness and tingling in her feet.  Latest foot exam: 10/2020.  Pt has FH of DM in mother, father, maternal grandmother, brother.  She also has a history of hypothyroidism:  Patient describes that she was previously on Synthroid for several years.  She could not tolerate generic levothyroxine due to facial rash and tingling.  However, her levothyroxine requirements decreased and she was able to come off.  She was off the hormone in 11/2019 when TSH was slightly high.  Repeat TSH 3 months later was still slightly high.  At that time, she was started on liothyronine by PCP (  10 mcg).  We stopped this in 02/2020.  He did not feel differently afterwards.  TFTs remained normal.  Reviewed her TFTs: Lab Results  Component Value Date   TSH 3.63 04/02/2021   TSH 2.79 10/28/2020   TSH 3.04 06/04/2020   TSH 4.57 (H) 03/12/2020   TSH 4.95 (H) 12/12/2019   TSH 2.25 02/02/2019   TSH 3.64 06/30/2018   TSH 2.79 08/31/2017   TSH 3.82 06/03/2015   No results found for: THGAB   Pt denies: - feeling nodules in neck - hoarseness - dysphagia - choking - SOB with lying down  She also has a history of GERD, IBS, obesity, gallstones - status post cholecystectomy, depression.  ROS: + see  HPI  Past Medical History:  Diagnosis Date   Anxiety    COVID-19 07/2018   Depression    Diabetes mellitus without complication ([place])    Elevated cholesterol    Gallstones    GERD (gastroesophageal reflux disease)    Hematochezia 10/2017   Plan for colonoscopy   Hyperplastic colon polyp 11/2017   IBS (irritable bowel syndrome)    diarrhea predominant   Infertility, female    Obesity    Thyroid disease 2004   treated for years, stopped 2014 per new MD   Past Surgical History:  Procedure Laterality Date   CHOLECYSTECTOMY  2006   CYST EXCISION     GLOMUS TUMOR EXCISION  2005, 2012   finger and cheek   Social History   Socioeconomic History   Marital status: Married    Spouse name: Not on file   Number of children: 0   Years of education: Not on file   Highest education level: Not on file  Occupational History   Occupation: Fish farm manager  Tobacco Use   Smoking status: Never   Smokeless tobacco: Never   Tobacco comments:    My dad smoked like a chimney when I was growing up  Scientific laboratory technician Use: Never used  Substance and Sexual Activity   Alcohol use: Yes    Alcohol/week: 0.0 standard drinks    Comment: Maybe once a month or less; only socially   Drug use: Not Currently    Types: Marijuana   Sexual activity: Yes    Partners: Male    Birth control/protection: None  Other Topics Concern   Not on file  Social History Narrative   Married. Husband's name is Audelia Acton. No children.   2 pets.   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   Drinks caffeine   Wears her seatbelt   Smoke detector in home   Safe in her relationship.   Social Determinants of Health   Financial Resource Strain: Not on file  Food Insecurity: Not on file  Transportation Needs: Not on file  Physical Activity: Not on file  Stress: Not on file  Social Connections: Not on file  Intimate Partner Violence: Not on file   Current Outpatient Medications on File Prior  to Visit  Medication Sig Dispense Refill   amphetamine-dextroamphetamine (ADDERALL) 10 MG tablet 5 mg PO BID 90 tablet 0   amphetamine-dextroamphetamine (ADDERALL) 10 MG tablet 5 mg twice daily 90 tablet 0   atorvastatin (LIPITOR) 40 MG tablet Take 1 tablet (40 mg total) by mouth daily. 90 tablet 3   Continuous Blood Gluc Sensor (FREESTYLE LIBRE 3 SENSOR) MISC 1 each by Does not apply route every 14 (fourteen) days. 6 each 3   dapagliflozin propanediol (FARXIGA) 10 MG TABS  tablet Take 1 tablet (10 mg total) by mouth daily before breakfast. 90 tablet 3   fenofibrate micronized (LOFIBRA) 134 MG capsule Take 1 capsule (134 mg total) by mouth daily. 90 capsule 3   glimepiride (AMARYL) 4 MG tablet Take 2 tablets (8 mg total) by mouth every morning. 180 tablet 3   Multiple Vitamin (MULTIVITAMIN ADULT PO) Take by mouth.     Omega-3 Fatty Acids (FISH OIL PO) Take 3,000 mg by mouth.     potassium chloride SA (KLOR-CON M) 20 MEQ tablet Take 1 tablet (20 mEq total) by mouth 2 (two) times daily for 6 doses. 6 tablet 0   Semaglutide, 1 MG/DOSE, (OZEMPIC, 1 MG/DOSE,) 4 MG/3ML SOPN Inject 1 mg into the skin once a week. 9 mL 3   venlafaxine XR (EFFEXOR XR) 75 MG 24 hr capsule Take 3 capsules (225 mg total) by mouth daily with breakfast. 270 capsule 1   No current facility-administered medications on file prior to visit.   Allergies  Allergen Reactions   Tape Other (See Comments)    Adhesive tape causes blisters   Levothyroxine Rash    Generic brand only caused rash   Family History  Problem Relation Age of Onset   Diabetes Mother    Depression Mother    Obesity Mother    COPD Father    Heart disease Father    Early death Father    Diabetes Father    Obesity Father    Diabetes Brother    Diabetes Maternal Grandmother    Hearing loss Maternal Grandmother    Dementia Maternal Grandmother    Early death Maternal Grandfather    Cancer Maternal Grandfather    Breast cancer Paternal Grandmother     Cancer Paternal Grandmother    Early death Paternal Grandmother    Heart disease Paternal Grandfather    Breast cancer Paternal Aunt    Heart disease Paternal Uncle    Heart attack Paternal Uncle    Breast cancer Paternal Aunt    Alcohol abuse Paternal Aunt    Alzheimer's disease Paternal Aunt    Alcohol abuse Paternal Aunt    Cancer Maternal Aunt    Obesity Maternal Aunt    Colon cancer Neg Hx    Esophageal cancer Neg Hx    Rectal cancer Neg Hx    Stomach cancer Neg Hx     PE: There were no vitals taken for this visit. Wt Readings from Last 3 Encounters:  04/02/21 223 lb (101.2 kg)  02/03/21 225 lb 3.2 oz (102.2 kg)  10/28/20 233 lb (105.7 kg)   Constitutional: overweight, in NAD Eyes: PERRLA, EOMI, no exophthalmos ENT: moist mucous membranes, no thyromegaly, no cervical lymphadenopathy Cardiovascular: RRR, No MRG, + L LE swelling - chronic Respiratory: CTA B Musculoskeletal: no deformities, strength intact in all 4 Skin: moist, warm, no rashes Neurological: + mild tremor with outstretched hands, DTR normal in all 4  ASSESSMENT: 1. DM2, non-insulin-dependent, uncontrolled, without long-term complications, but with hyperglycemia  No family history of medullary thyroid cancer or personal history of pancreatitis.  2.  Hypothyroidism  3. HL  PLAN:  1. Patient with longstanding, uncontrolled, type 2 diabetes, on oral antidiabetic regimen with sulfonylurea and SGLT2 inhibitor and also weekly GLP-1 receptor agonist, with significant increase in HbA1c at last visit, to 8.8%.  Sugars were decreasing overnight but only to approximately 160 and then increasing significantly after breakfast and even more after dinner.  Unfortunately, she was not able to continue to  use a CGM due to price.  Also, at last visit, she missed a month of Ozempic.  She was telling me at last visit that she was planning to restart the CGM and Ozempic after the first of the year.  Therefore, we did not make  changes in her regimen.  - I suggested to:  Patient Instructions  Please continue: - Faxiga 10 mg before b'fast - Glimepiride 8 mg before b'fast - Ozempic 1 mg weekly in a.m.  Please return in 3-4 months with your sugar log.   - we checked her HbA1c: 7%  - advised to check sugars at different times of the day - 1x a day, rotating check times - advised for yearly eye exams >> she is UTD - return to clinic in 3-4 months   2.  Hypothyroidism -She was previously on Synthroid d.a.w. for several years, however, she was able to come off as her TFTs normalized.  She was then started on liothyronine 10 mcg daily after her TSH returned slightly high x2 in 02/2020: 4.95 and 4.57, respectively. -She did not feel different after starting liothyronine.  After discussion about the standard treatment for hypothyroidism with levothyroxine, and not liothyronine, and also the side effect of liothyronine especially at higher doses, she agreed to taper the liothyronine down, to off.  Her TFTs normalized after coming off thyroid hormones: -Latest TSH was normal: Lab Results  Component Value Date   TSH 3.63 04/02/2021  -For now we will follow her expectantly  3. HL -Reviewed latest lipid panel from 03/2021: Triglycerides elevated, LDL at goal, HDL low: Lab Results  Component Value Date   CHOL 89 04/02/2021   HDL 26.30 (L) 04/02/2021   LDLCALC 53 10/28/2020   LDLDIRECT 34.0 04/02/2021   TRIG 251.0 (H) 04/02/2021   CHOLHDL 3 04/02/2021  -She continues on Lipitor 40 mg daily, fenofibrate 134 mg daily and fish oil, without side effects.  We stopped colesevelam after she ran out, as her triglycerides were elevated.  Philemon Kingdom, MD PhD [facility]ocr[facility]

## 2021-05-13 NOTE — Patient Instructions (Signed)

## 2021-07-06 ENCOUNTER — Encounter: Payer: Self-pay | Admitting: Family Medicine

## 2021-07-07 NOTE — Telephone Encounter (Signed)
Please see if she can perform a virtual visit this afternoon to discuss

## 2021-07-09 ENCOUNTER — Encounter: Payer: Self-pay | Admitting: Family Medicine

## 2021-07-09 ENCOUNTER — Ambulatory Visit: Payer: BC Managed Care – PPO | Admitting: Family Medicine

## 2021-07-09 VITALS — BP 122/74 | HR 83 | Temp 97.7°F | Ht 66.0 in | Wt 219.0 lb

## 2021-07-09 DIAGNOSIS — R41840 Attention and concentration deficit: Secondary | ICD-10-CM

## 2021-07-09 MED ORDER — METHYLPHENIDATE HCL ER (OSM) 18 MG PO TBCR: 18.0000 mg | EXTENDED_RELEASE_TABLET | Freq: Every day | ORAL | 0 refills | Status: DC

## 2021-07-09 NOTE — Progress Notes (Signed)
Patient ID: Melissa Gray, female  DOB: [birth year], 46 y.o.   MRN: [id number] Patient Care Team    Relationship Specialty Notifications Start End  Ma Hillock, DO PCP - General Family Medicine  06/03/15   Lavena Bullion, DO Consulting Physician Gastroenterology  11/16/17   Nunzio Cobbs, MD Consulting Physician Obstetrics and Gynecology  03/28/20     Chief Complaint  Patient presents with   ADHD    Pt was given amphetamine salts 10 mg in replacement of script prescribed; medication isn't working    Subjective: Melissa Gray is a 46 y.o.  Female  present for [facility] All past medical history, surgical history, allergies, family history, immunizations, medications and social history were updated in the electronic medical record today. All recent labs, ED visits and hospitalizations within the last year were reviewed.  ADD: Pt reports he was provided with amphetamine salts 10 mg for replacement of her Adderall 10 mg secondary to factory backorder.  Patient reports she did not feel it worked very well for her.  She even took a second dose and still did not feel it worked as well.  Typically she is prescribed Adderall 10 mg, half a tab twice daily.  She is present today to discuss other options. Prior note: Pt presents for an OV to discuss her attention and concentration difficulty. She reports onset as young as first grade. Unfortunately she was never diagnosed at that time. She recently was seen by a psychologist who completed an ADHD evaluation on her and it was positive. She would like to discuss medication start today. ADHD screening tool completed today and positive     04/02/2021    8:10 AM 10/28/2020    9:06 AM 03/28/2020    1:10 PM 03/12/2020    8:43 AM 12/12/2019    8:08 AM  Depression screen PHQ 2/9  Decreased Interest '1 1 1 '$ 0 1  Down, Depressed, Hopeless 0 '1 1 1 2  '$ PHQ - 2 Score '1 2 2 1 3  '$ Altered sleeping '2 2 2 2 3  '$ Tired, decreased energy '1 1 2 1 1   '$ Change in appetite '1 1 1 '$ 0 0  Feeling bad or failure about yourself  '1 1 1 1 2  '$ Trouble concentrating '1 1 1 1 1  '$ Moving slowly or fidgety/restless 0 0 1 1 0  Suicidal thoughts 0 0 0 0 0  PHQ-9 Score '7 8 10 7 10      '$ 04/02/2021    8:11 AM 10/28/2020    9:17 AM 03/28/2020    1:10 PM 03/12/2020    8:43 AM  GAD 7 : Generalized Anxiety Score  Nervous, Anxious, on Edge '2 1 1 1  '$ Control/stop worrying '1 1 2 2  '$ Worry too much - different things '1 1 2 2  '$ Trouble relaxing '1 1 1 1  '$ Restless '1 1 2 1  '$ Easily annoyed or irritable '2 1 2 1  '$ Afraid - awful might happen 0 '1 1 1  '$ Total GAD 7 Score '8 7 11 9  '$ Anxiety Difficulty    Somewhat difficult   Immunization History  Administered Date(s) Administered   Influenza Inj Mdck Quad Pf 11/18/2017   Influenza,inj,Quad PF,6+ Mos 11/29/2016, 02/02/2019, 09/27/2019, 10/28/2020   Influenza-Unspecified 12/01/2015, 11/29/2016   PFIZER(Purple Top)SARS-COV-2 Vaccination 04/10/2019, 05/01/2019, 11/01/2019, 10/04/2020   Pneumococcal Conjugate-13 07/02/2015   Pneumococcal Polysaccharide-23 06/17/2016   Tdap 04/02/2021   Past Medical History:  Diagnosis Date   Anxiety  COVID-19 07/2018   Depression    Diabetes mellitus without complication ([place])    Elevated cholesterol    Gallstones    GERD (gastroesophageal reflux disease)    Hematochezia 10/2017   Plan for colonoscopy   Hyperplastic colon polyp 11/2017   IBS (irritable bowel syndrome)    diarrhea predominant   Infertility, female    Obesity    Thyroid disease 2004   treated for years, stopped 2014 per new MD   Allergies  Allergen Reactions   Tape Other (See Comments)    Adhesive tape causes blisters   Levothyroxine Rash    Generic brand only caused rash   Past Surgical History:  Procedure Laterality Date   CHOLECYSTECTOMY  2006   CYST EXCISION     GLOMUS TUMOR EXCISION  2005, 2012   finger and cheek   Family History  Problem Relation Age of Onset   Diabetes Mother    Depression  Mother    Obesity Mother    COPD Father    Heart disease Father    Early death Father    Diabetes Father    Obesity Father    Diabetes Brother    Diabetes Maternal Grandmother    Hearing loss Maternal Grandmother    Dementia Maternal Grandmother    Early death Maternal Grandfather    Cancer Maternal Grandfather    Breast cancer Paternal Grandmother    Cancer Paternal Grandmother    Early death Paternal Grandmother    Heart disease Paternal Grandfather    Breast cancer Paternal Aunt    Heart disease Paternal Uncle    Heart attack Paternal Uncle    Breast cancer Paternal Aunt    Alcohol abuse Paternal Aunt    Alzheimer's disease Paternal Aunt    Alcohol abuse Paternal Aunt    Cancer Maternal Aunt    Obesity Maternal Aunt    Colon cancer Neg Hx    Esophageal cancer Neg Hx    Rectal cancer Neg Hx    Stomach cancer Neg Hx    Social History   Social History Narrative   Married. Husband's name is Audelia Acton. No children.   2 pets.   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   Drinks caffeine   Wears her seatbelt   Smoke detector in home   Safe in her relationship.    Allergies as of 07/09/2021       Reactions   Tape Other (See Comments)   Adhesive tape causes blisters   Levothyroxine Rash   Generic brand only caused rash        Medication List        Accurate as of July 09, 2021 10:29 AM. If you have any questions, ask your nurse or doctor.          STOP taking these medications    amoxicillin-clavulanate 875-125 MG tablet Commonly known as: AUGMENTIN Stopped by: Howard Pouch, DO   amphetamine-dextroamphetamine 10 MG tablet Commonly known as: ADDERALL Stopped by: Howard Pouch, DO   benzonatate 200 MG capsule Commonly known as: TESSALON Stopped by: Howard Pouch, DO   HYDROcodone bit-homatropine 5-1.5 MG/5ML syrup Commonly known as: HYCODAN Stopped by: Howard Pouch, DO   ipratropium 0.06 % nasal spray Commonly known as:  ATROVENT Stopped by: Howard Pouch, DO       TAKE these medications    atorvastatin 40 MG tablet Commonly known as: LIPITOR Take 1 tablet (40 mg total) by mouth daily.   dapagliflozin propanediol 10 MG  Tabs tablet Commonly known as: Farxiga Take 1 tablet (10 mg total) by mouth daily before breakfast.   fenofibrate micronized 134 MG capsule Commonly known as: LOFIBRA Take 1 capsule (134 mg total) by mouth daily.   FISH OIL PO Take 3,000 mg by mouth.   fluticasone 50 MCG/ACT nasal spray Commonly known as: FLONASE Place 2 sprays into both nostrils daily.   FreeStyle Libre 3 Sensor Misc 1 each by Does not apply route every 14 (fourteen) days.   glimepiride 4 MG tablet Commonly known as: AMARYL Take 2 tablets (8 mg total) by mouth every morning.   methylphenidate 18 MG CR tablet Commonly known as: Concerta Take 1 tablet (18 mg total) by mouth daily. Started by: Howard Pouch, DO   MULTIVITAMIN ADULT PO Take by mouth.   Ozempic (1 MG/DOSE) 4 MG/3ML Sopn Generic drug: Semaglutide (1 MG/DOSE) Inject 1 mg into the skin once a week.   potassium chloride SA 20 MEQ tablet Commonly known as: KLOR-CON M Take 1 tablet (20 mEq total) by mouth 2 (two) times daily for 6 doses.   venlafaxine XR 75 MG 24 hr capsule Commonly known as: Effexor XR Take 3 capsules (225 mg total) by mouth daily with breakfast.        All past medical history, surgical history, allergies, family history, immunizations andmedications were updated in the EMR today and reviewed under the history and medication portions of their EMR.     No results found for this or any previous visit (from the past 2160 hour(s)).   MM 3D SCREEN BREAST BILATERAL Result Date: 09/28/2020 (Code:SM-B-01Y) BI-RADS CATEGORY  1: Negative. Electronically Signed   By: Abelardo Diesel M.D.   On: 09/28/2020 16:42   ROS 14 pt review of systems performed and negative (unless mentioned in an HPI)  Objective: BP 122/74   Pulse  83   Temp 97.7 F (36.5 C) (Oral)   Ht '5\' 6"'$  (1.676 m)   Wt 219 lb (99.3 kg)   LMP 07/08/2021   SpO2 96%   BMI 35.35 kg/m  Physical Exam Vitals and nursing note reviewed.  Constitutional:      General: She is not in acute distress.    Appearance: Normal appearance. She is normal weight. She is not ill-appearing or toxic-appearing.  Eyes:     Extraocular Movements: Extraocular movements intact.     Conjunctiva/sclera: Conjunctivae normal.     Pupils: Pupils are equal, round, and reactive to light.  Neurological:     Mental Status: She is alert and oriented to person, place, and time. Mental status is at baseline.  Psychiatric:        Mood and Affect: Mood normal.        Behavior: Behavior normal.        Thought Content: Thought content normal.        Judgment: Judgment normal.        No results found.  Assessment/plan: Ceria Suminski is a 46 y.o. female present for  Attention and concentration deficit We discussed different options today.  Reviewed her options with her formulary. Decided to switch Adderall 5 mg twice daily to Concerta 18 mg daily. #30 provided today. She will call in within the next 2-4 weeks to let us know how she feels Concerta is working.  If working well and she would like to continue or increase will call in for her at that time.  She already has follow-up scheduled in August. [place] controlled substance database was reviewed  07/09/21    Per routine schedule  No orders of the defined types were placed in this encounter.   No orders of the defined types were placed in this encounter.  Meds ordered this encounter  Medications   methylphenidate (CONCERTA) 18 MG PO CR tablet    Sig: Take 1 tablet (18 mg total) by mouth daily.    Dispense:  30 tablet    Refill:  0   Referral Orders  No referral(s) requested today     Electronically signed by: Howard Pouch, [place]

## 2021-07-09 NOTE — Patient Instructions (Signed)
Have a great trip!!!   Let us know in about 3 weeks if this is working well for you or not.    Concerta is once daily.

## 2021-09-17 ENCOUNTER — Encounter: Payer: Self-pay | Admitting: Family Medicine

## 2021-09-17 ENCOUNTER — Ambulatory Visit: Payer: BC Managed Care – PPO | Admitting: Family Medicine

## 2021-09-17 VITALS — BP 117/75 | HR 84 | Temp 97.3°F | Ht 62.0 in | Wt 220.0 lb

## 2021-09-17 DIAGNOSIS — E785 Hyperlipidemia, unspecified: Secondary | ICD-10-CM

## 2021-09-17 DIAGNOSIS — R41840 Attention and concentration deficit: Secondary | ICD-10-CM | POA: Diagnosis not present

## 2021-09-17 DIAGNOSIS — Z23 Encounter for immunization: Secondary | ICD-10-CM | POA: Diagnosis not present

## 2021-09-17 DIAGNOSIS — E1169 Type 2 diabetes mellitus with other specified complication: Secondary | ICD-10-CM

## 2021-09-17 DIAGNOSIS — F418 Other specified anxiety disorders: Secondary | ICD-10-CM | POA: Diagnosis not present

## 2021-09-17 MED ORDER — AMPHETAMINE-DEXTROAMPHETAMINE 10 MG PO TABS: ORAL_TABLET | ORAL | 0 refills | Status: DC

## 2021-09-17 MED ORDER — VENLAFAXINE HCL ER 75 MG PO CP24: 225.0000 mg | ORAL_CAPSULE | Freq: Every day | ORAL | 1 refills | Status: DC

## 2021-09-17 MED ORDER — AMPHETAMINE-DEXTROAMPHETAMINE 10 MG PO TABS
ORAL_TABLET | ORAL | 0 refills | Status: DC
Start: 2021-09-17 — End: 2022-03-03

## 2021-09-17 NOTE — Patient Instructions (Signed)
No follow-ups on file.        Great to see you today.  I have refilled the medication(s) we provide.   If labs were collected, we will inform you of lab results once received either by echart message or telephone call.   - echart message- for normal results that have been seen by the patient already.   - telephone call: abnormal results or if patient has not viewed results in their echart.  

## 2021-09-17 NOTE — Telephone Encounter (Signed)
FYI

## 2021-09-17 NOTE — Progress Notes (Signed)
Patient ID: Melissa Gray, female  DOB: May 13, 1975, 46 y.o.   MRN: [id number] Patient Care Team    Relationship Specialty Notifications Start End  Ma Hillock, DO PCP - General Family Medicine  06/03/15   Lavena Bullion, DO Consulting Physician Gastroenterology  11/16/17   Nunzio Cobbs, MD Consulting Physician Obstetrics and Gynecology  03/28/20     Chief Complaint  Patient presents with   Depression    Cmc; pt is fasting    Subjective: Melissa Gray is a 46 y.o.  Female  present for [facility] All past medical history, surgical history, allergies, family history, immunizations, medications and social history were updated in the electronic medical record today. All recent labs, ED visits and hospitalizations within the last year were reviewed.  ADD/anxiety/depression: Secondary to factory backorder's, last visit we tried Concerta, which she felt also did not work well.  Would like to try going back on the Adderall 5 mg twice daily in hopes that her pharmacy have back in stock.  She feels the Effexor to 25 mg is still working well for her. Prior note Pt reports he was provided with amphetamine salts 10 mg for replacement of her Adderall 10 mg secondary to factory backorder.  Patient reports she did not feel it worked very well for her.  She even took a second dose and still did not feel it worked as well.  Typically she is prescribed Adderall 10 mg, half a tab twice daily.  She is present today to discuss other options. Prior note: Pt presents for an OV to discuss her attention and concentration difficulty. She reports onset as young as first grade. Unfortunately she was never diagnosed at that time. She recently was seen by a psychologist who completed an ADHD evaluation on her and it was positive. She would like to discuss medication start today. ADHD screening tool completed today and positive     09/17/2021    8:21 AM 04/02/2021    8:10 AM 10/28/2020    9:06 AM  03/28/2020    1:10 PM 03/12/2020    8:43 AM  Depression screen PHQ 2/9  Decreased Interest '1 1 1 1 '$ 0  Down, Depressed, Hopeless 1 0 '1 1 1  '$ PHQ - 2 Score '2 1 2 2 1  '$ Altered sleeping '2 2 2 2 2  '$ Tired, decreased energy '1 1 1 2 1  '$ Change in appetite '1 1 1 1 '$ 0  Feeling bad or failure about yourself  '1 1 1 1 1  '$ Trouble concentrating '1 1 1 1 1  '$ Moving slowly or fidgety/restless 1 0 0 1 1  Suicidal thoughts 0 0 0 0 0  PHQ-9 Score '9 7 8 10 7      '$ 09/17/2021    8:21 AM 04/02/2021    8:11 AM 10/28/2020    9:17 AM 03/28/2020    1:10 PM  GAD 7 : Generalized Anxiety Score  Nervous, Anxious, on Edge '1 2 1 1  '$ Control/stop worrying '1 1 1 2  '$ Worry too much - different things '1 1 1 2  '$ Trouble relaxing '1 1 1 1  '$ Restless '1 1 1 2  '$ Easily annoyed or irritable '1 2 1 2  '$ Afraid - awful might happen 1 0 1 1  Total GAD 7 Score '7 8 7 11   '$ Immunization History  Administered Date(s) Administered   Influenza Inj Mdck Quad Pf 11/18/2017   Influenza,inj,Quad PF,6+ Mos 11/29/2016, 02/02/2019, 09/27/2019, 10/28/2020, 09/17/2021  Influenza-Unspecified 12/01/2015, 11/29/2016   PFIZER(Purple Top)SARS-COV-2 Vaccination 04/10/2019, 05/01/2019, 11/01/2019, 10/04/2020   Pneumococcal Conjugate-13 07/02/2015   Pneumococcal Polysaccharide-23 06/17/2016   Tdap 04/02/2021   Past Medical History:  Diagnosis Date   Anxiety    COVID-19 07/2018   Depression    Diabetes mellitus without complication ([place])    Elevated cholesterol    Gallstones    GERD (gastroesophageal reflux disease)    Hematochezia 10/2017   Plan for colonoscopy   Hyperplastic colon polyp 11/2017   IBS (irritable bowel syndrome)    diarrhea predominant   Infertility, female    Obesity    Thyroid disease 2004   treated for years, stopped 2014 per new MD   Allergies  Allergen Reactions   Tape Other (See Comments)    Adhesive tape causes blisters   Levothyroxine Rash    Generic brand only caused rash   Past Surgical History:  Procedure  Laterality Date   CHOLECYSTECTOMY  2006   CYST EXCISION     GLOMUS TUMOR EXCISION  2005, 2012   finger and cheek   Family History  Problem Relation Age of Onset   Diabetes Mother    Depression Mother    Obesity Mother    COPD Father    Heart disease Father    Early death Father    Diabetes Father    Obesity Father    Diabetes Brother    Diabetes Maternal Grandmother    Hearing loss Maternal Grandmother    Dementia Maternal Grandmother    Early death Maternal Grandfather    Cancer Maternal Grandfather    Breast cancer Paternal Grandmother    Cancer Paternal Grandmother    Early death Paternal Grandmother    Heart disease Paternal Grandfather    Breast cancer Paternal Aunt    Heart disease Paternal Uncle    Heart attack Paternal Uncle    Breast cancer Paternal Aunt    Alcohol abuse Paternal Aunt    Alzheimer's disease Paternal Aunt    Alcohol abuse Paternal Aunt    Cancer Maternal Aunt    Obesity Maternal Aunt    Colon cancer Neg Hx    Esophageal cancer Neg Hx    Rectal cancer Neg Hx    Stomach cancer Neg Hx    Social History   Social History Narrative   Married. Husband's name is Audelia Acton. No children.   2 pets.   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   Drinks caffeine   Wears her seatbelt   Smoke detector in home   Safe in her relationship.    Allergies as of 09/17/2021       Reactions   Tape Other (See Comments)   Adhesive tape causes blisters   Levothyroxine Rash   Generic brand only caused rash        Medication List        Accurate as of September 17, 2021  9:06 AM. If you have any questions, ask your nurse or doctor.          amphetamine-dextroamphetamine 10 MG tablet Commonly known as: Adderall 1/2 tab BID Started by: Howard Pouch, DO   atorvastatin 40 MG tablet Commonly known as: LIPITOR Take 1 tablet (40 mg total) by mouth daily.   dapagliflozin propanediol 10 MG Tabs tablet Commonly known as: Farxiga Take 1  tablet (10 mg total) by mouth daily before breakfast.   fenofibrate micronized 134 MG capsule Commonly known as: LOFIBRA Take 1 capsule (134 mg total) by  mouth daily.   FISH OIL PO Take 3,000 mg by mouth.   fluticasone 50 MCG/ACT nasal spray Commonly known as: FLONASE Place 2 sprays into both nostrils daily.   FreeStyle Libre 3 Sensor Misc 1 each by Does not apply route every 14 (fourteen) days.   glimepiride 4 MG tablet Commonly known as: AMARYL Take 2 tablets (8 mg total) by mouth every morning.   methylphenidate 18 MG CR tablet Commonly known as: Concerta Take 1 tablet (18 mg total) by mouth daily.   MULTIVITAMIN ADULT PO Take by mouth.   Ozempic (1 MG/DOSE) 4 MG/3ML Sopn Generic drug: Semaglutide (1 MG/DOSE) Inject 1 mg into the skin once a week.   potassium chloride SA 20 MEQ tablet Commonly known as: KLOR-CON M Take 1 tablet (20 mEq total) by mouth 2 (two) times daily for 6 doses.   venlafaxine XR 75 MG 24 hr capsule Commonly known as: Effexor XR Take 3 capsules (225 mg total) by mouth daily with breakfast.        All past medical history, surgical history, allergies, family history, immunizations andmedications were updated in the EMR today and reviewed under the history and medication portions of their EMR.     No results found for this or any previous visit (from the past 2160 hour(s)).   MM 3D SCREEN BREAST BILATERAL Result Date: 09/28/2020 (Code:SM-B-01Y) BI-RADS CATEGORY  1: Negative. Electronically Signed   By: Abelardo Diesel M.D.   On: 09/28/2020 16:42   ROS 14 pt review of systems performed and negative (unless mentioned in an HPI)  Objective: BP 117/75   Pulse 84   Temp (!) 97.3 F (36.3 C) (Oral)   Ht '5\' 2"'$  (1.575 m)   Wt 220 lb (99.8 kg)   SpO2 96%   BMI 40.24 kg/m  Physical Exam Vitals and nursing note reviewed.  Constitutional:      General: She is not in acute distress.    Appearance: Normal appearance. She is normal weight. She  is not ill-appearing or toxic-appearing.  Eyes:     Extraocular Movements: Extraocular movements intact.     Conjunctiva/sclera: Conjunctivae normal.     Pupils: Pupils are equal, round, and reactive to light.  Neurological:     Mental Status: She is alert and oriented to person, place, and time. Mental status is at baseline.  Psychiatric:        Mood and Affect: Mood normal.        Behavior: Behavior normal.        Thought Content: Thought content normal.        Judgment: Judgment normal.        No results found.  Assessment/plan: Melissa Gray is a 46 y.o. female present for  Type 2 diabetes mellitus with hyperlipidemia (HCC)/4Morbid obesity ([place]) Managed by endocrine  Need for immunization against influenza - Flu Vaccine QUAD 6+ mos PF IM (Fluarix Quad PF)  Attention and concentration deficit/Depression with anxiety Will return back to Adderall 5 mg twice daily.  She will call us to let us know if pharmacy has in stock.  If so we will: Second prescription for her as well. [place] controlled substance database was reviewed 09/17/21 Adderall 10 mg-half tab twice daily, # 90   Orders Placed This Encounter  Procedures   Flu Vaccine QUAD 6+ mos PF IM (Fluarix Quad PF)   Meds ordered this encounter  Medications   venlafaxine XR (EFFEXOR XR) 75 MG 24 hr capsule    Sig:  Take 3 capsules (225 mg total) by mouth daily with breakfast.    Dispense:  270 capsule    Refill:  1   amphetamine-dextroamphetamine (ADDERALL) 10 MG tablet    Sig: 1/2 tab BID    Dispense:  90 tablet    Refill:  0   Referral Orders  No referral(s) requested today     Electronically signed by: Howard Pouch, [place]

## 2021-09-17 NOTE — Telephone Encounter (Signed)
amphetamine-dextroamphetamine (ADDERALL) 10 MG tablet 90 tablet 0 09/17/2021    Sig: 1/2 tab BID   Sent to pharmacy as: amphetamine-dextroamphetamine (ADDERALL) 10 MG tablet   Earliest Fill Date: 09/17/2021   E-Prescribing Status: Transmission to pharmacy failed (09/17/2021  9:22 AM EDT)   Message completed by Kavin Leech, [place] (09/17/2021 9:23 AM).     Please make patient aware that I had called in her prescription 922 this morning.  Unfortunately it seems the transmission to her pharmacy failed.  I have sent over her prescription again with an additional prescription.  Should last 6 months for her.

## 2021-09-29 IMAGING — MG MM DIGITAL SCREENING BILAT W/ TOMO AND CAD
8 series · 8 of 24 positions shown · non-contrast
Comparison: Previous exam(s).

CLINICAL DATA: Screening.

EXAM:
DIGITAL SCREENING BILATERAL MAMMOGRAM WITH TOMOSYNTHESIS AND CAD
TECHNIQUE: Bilateral screening digital craniocaudal and mediolateral oblique
mammograms were obtained. Bilateral screening digital breast
tomosynthesis was performed. The images were evaluated with
computer-aided detection.

[R CC synth-2D]
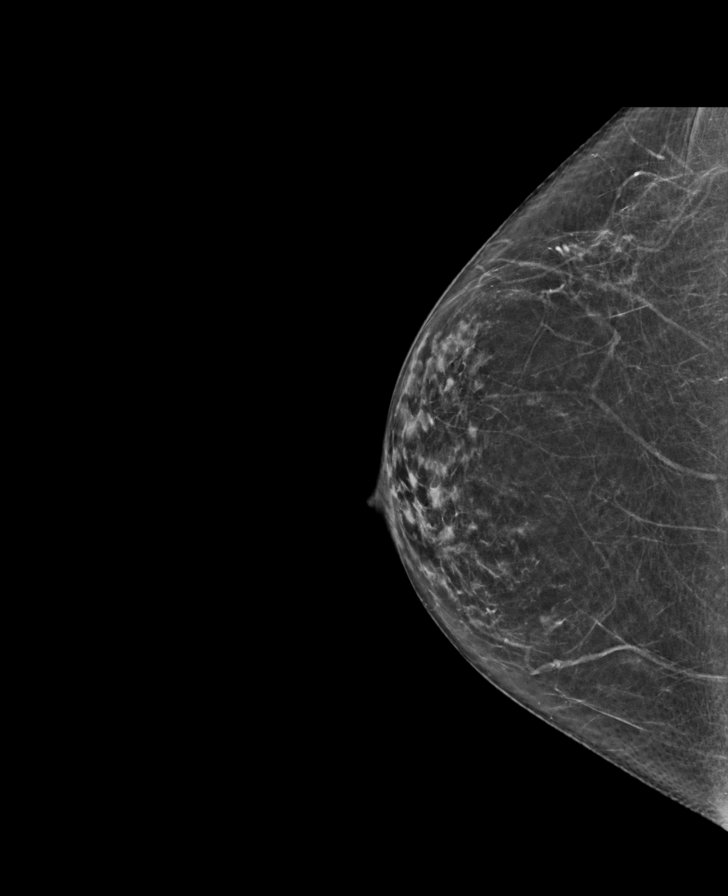

[L CC synth-2D]
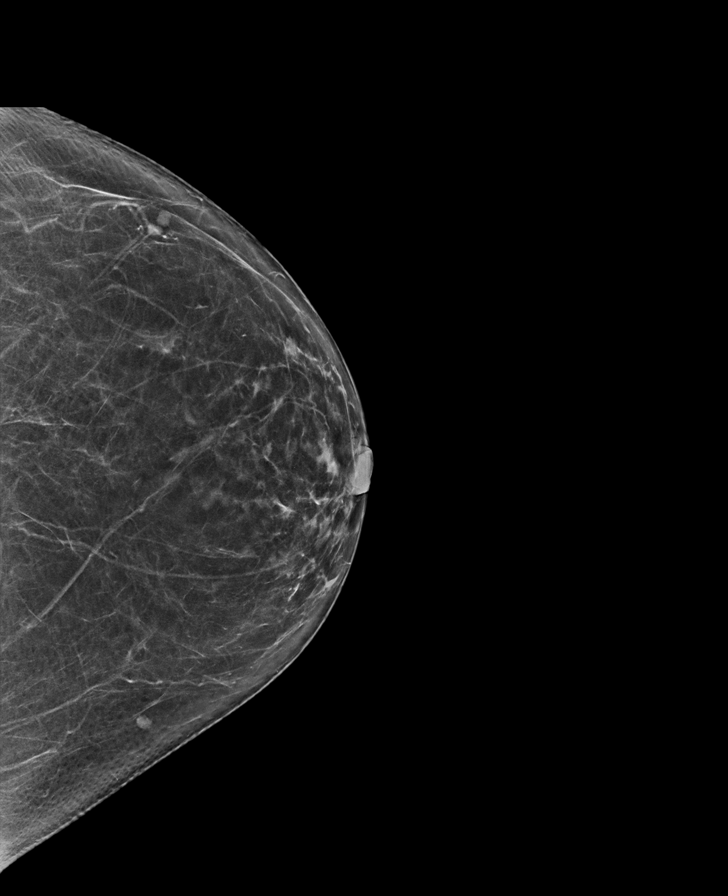

[L MLO synth-2D]
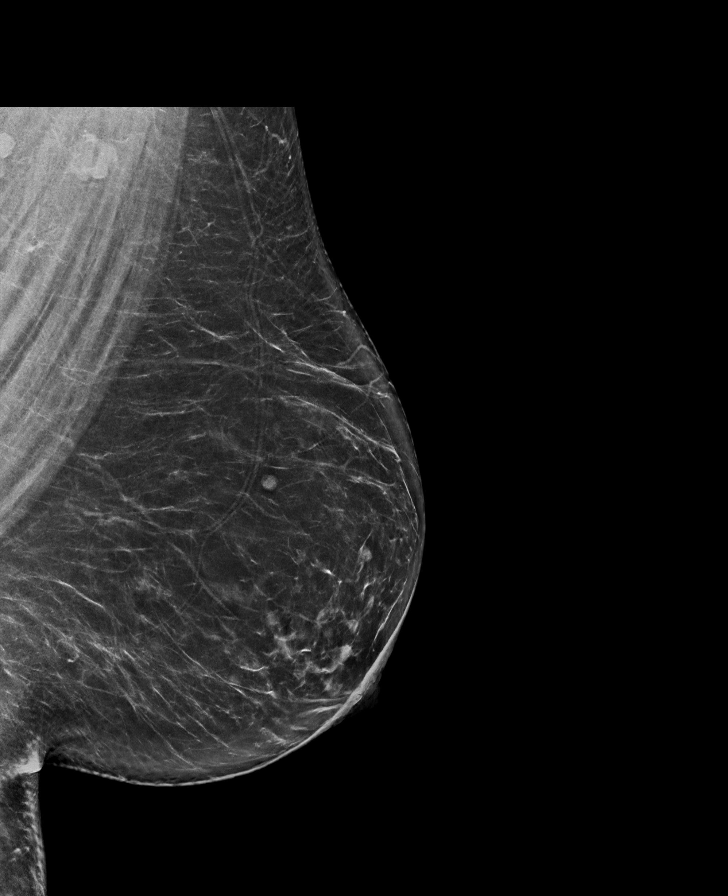

[R MLO synth-2D]
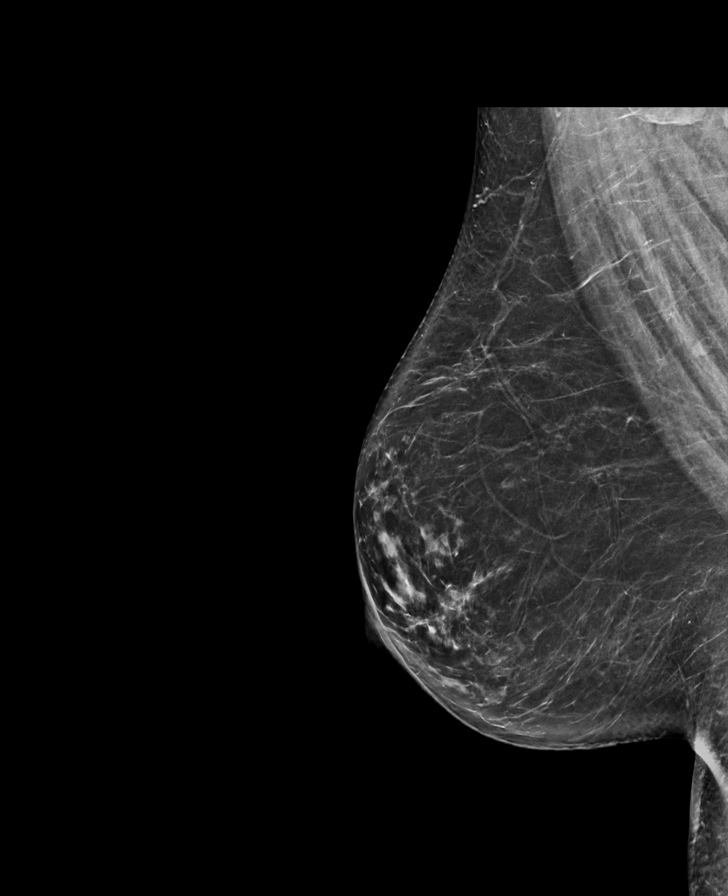

[R CC tomo · tomo slice 32/63.0]
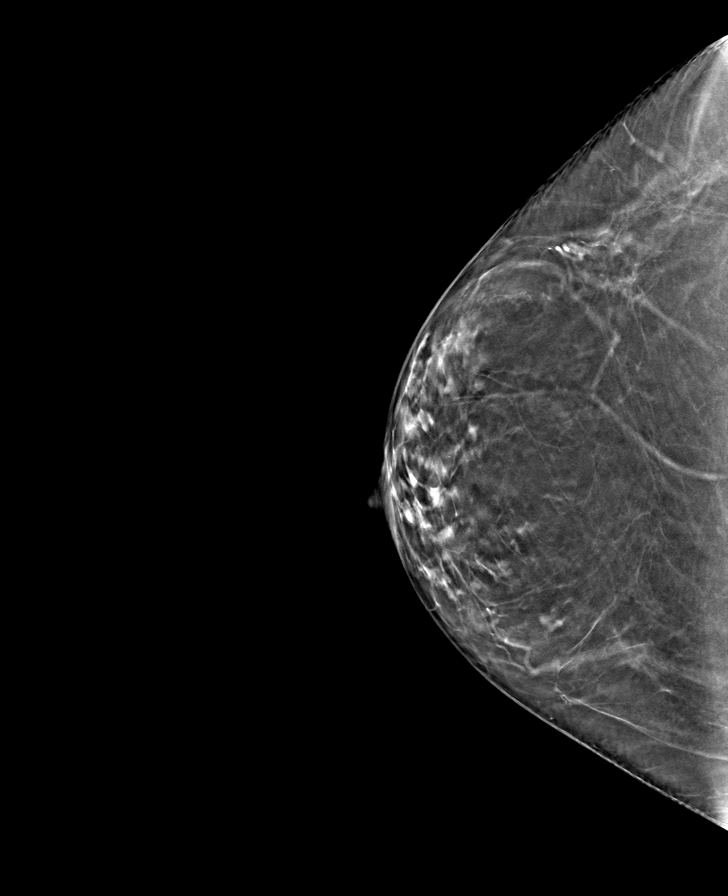

[R MLO tomo · tomo slice 39/78.0]
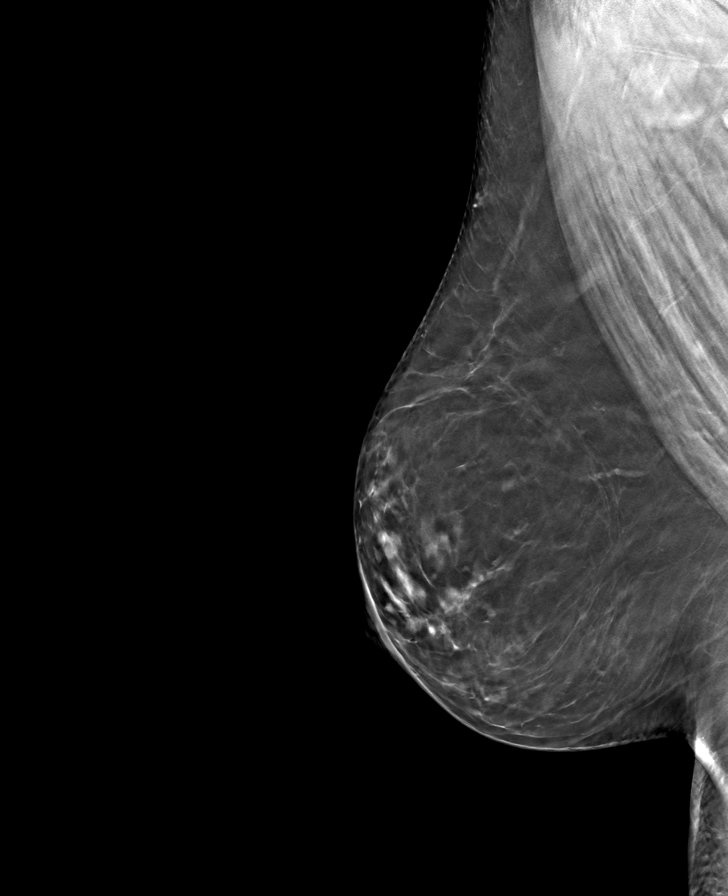

[L MLO tomo · tomo slice 43/84.0]
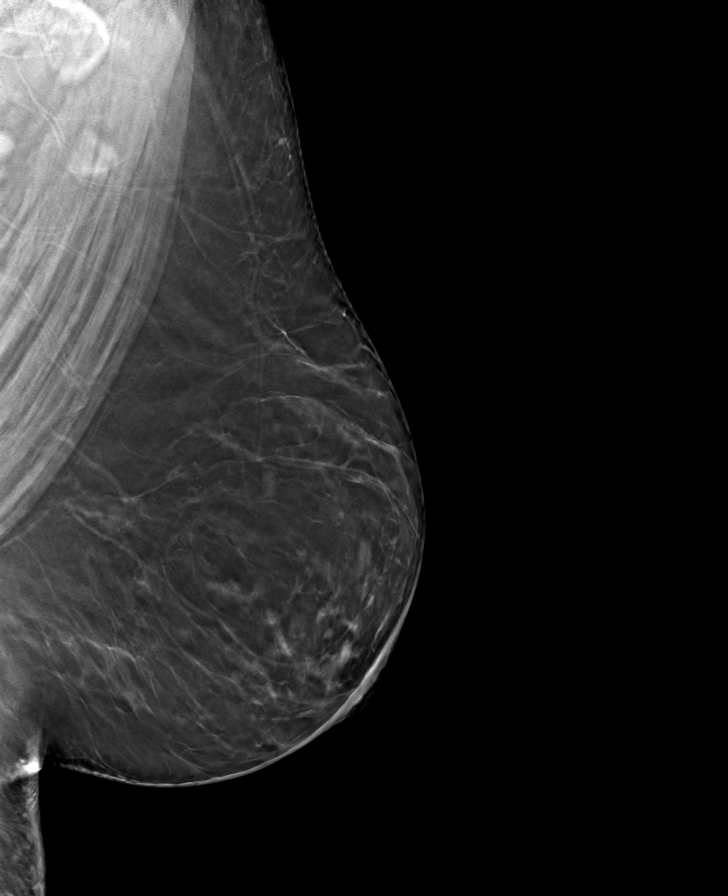

[L CC tomo · tomo slice 35/68.0]
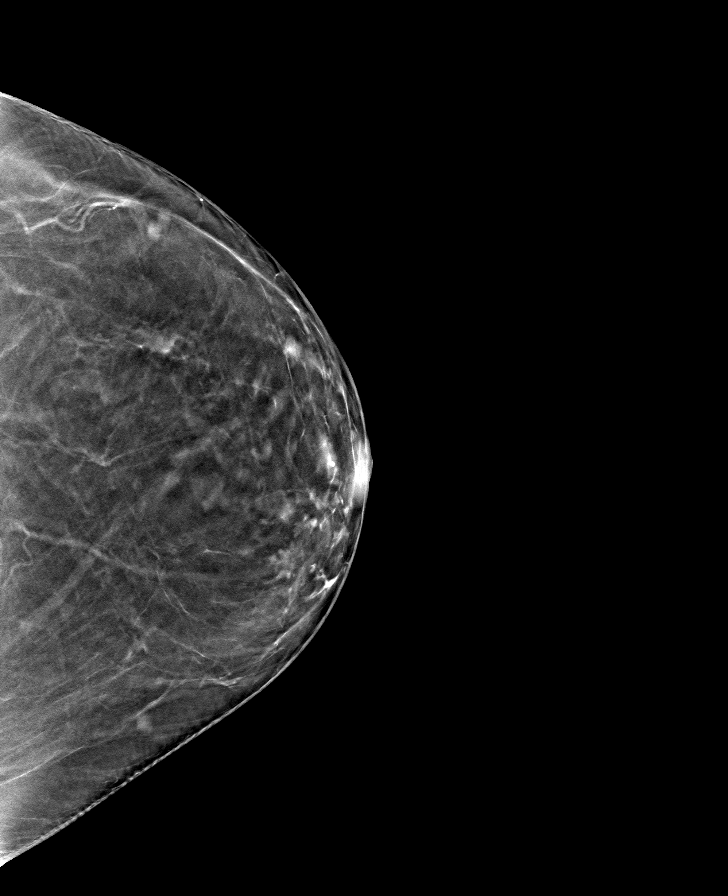

[8 of 24 positions shown; findings below may reference images not displayed]

ACR Breast Density Category b: There are scattered areas of
fibroglandular density.
FINDINGS: There are no findings suspicious for malignancy.
IMPRESSION: No mammographic evidence of malignancy. A result letter of this
screening mammogram will be mailed directly to the patient.

RECOMMENDATION:
Screening mammogram in one year. (Code:51-O-LD2)

BI-RADS CATEGORY  1: Negative.

## 2021-11-30 ENCOUNTER — Other Ambulatory Visit: Payer: Self-pay | Admitting: Internal Medicine

## 2021-12-01 LAB — HM DIABETES EYE EXAM

## 2021-12-02 ENCOUNTER — Telehealth: Payer: BC Managed Care – PPO | Admitting: Physician Assistant

## 2021-12-02 DIAGNOSIS — B3731 Acute candidiasis of vulva and vagina: Secondary | ICD-10-CM

## 2021-12-03 ENCOUNTER — Encounter: Payer: Self-pay | Admitting: Internal Medicine

## 2021-12-03 MED ORDER — FLUCONAZOLE 150 MG PO TABS: ORAL_TABLET | ORAL | 0 refills | Status: DC

## 2021-12-03 NOTE — Progress Notes (Signed)
Message sent to patient requesting further input regarding current symptoms. Awaiting patient response.  

## 2021-12-03 NOTE — Progress Notes (Signed)
I have spent 5 minutes in review of e-visit questionnaire, review and updating patient chart, medical decision making and response to patient.   Lawonda Pretlow Cody Nahima Ales, PA-C    

## 2021-12-03 NOTE — Progress Notes (Signed)

## 2022-02-25 ENCOUNTER — Encounter: Payer: Self-pay | Admitting: Family Medicine

## 2022-02-26 ENCOUNTER — Other Ambulatory Visit: Payer: Self-pay

## 2022-02-26 ENCOUNTER — Other Ambulatory Visit: Payer: Self-pay | Admitting: Family Medicine

## 2022-02-26 MED ORDER — VENLAFAXINE HCL ER 75 MG PO CP24: 225.0000 mg | ORAL_CAPSULE | Freq: Every day | ORAL | 0 refills | Status: DC

## 2022-03-04 ENCOUNTER — Ambulatory Visit: Payer: BC Managed Care – PPO | Admitting: Family Medicine

## 2022-03-05 ENCOUNTER — Encounter: Payer: Self-pay | Admitting: Family Medicine

## 2022-03-05 ENCOUNTER — Other Ambulatory Visit: Payer: Self-pay | Admitting: Internal Medicine

## 2022-03-05 ENCOUNTER — Ambulatory Visit: Payer: BC Managed Care – PPO | Admitting: Family Medicine

## 2022-03-05 VITALS — BP 128/78 | HR 87 | Temp 98.4°F | Ht 66.0 in | Wt 218.0 lb

## 2022-03-05 DIAGNOSIS — F418 Other specified anxiety disorders: Secondary | ICD-10-CM | POA: Diagnosis not present

## 2022-03-05 DIAGNOSIS — E1169 Type 2 diabetes mellitus with other specified complication: Secondary | ICD-10-CM | POA: Diagnosis not present

## 2022-03-05 DIAGNOSIS — E039 Hypothyroidism, unspecified: Secondary | ICD-10-CM

## 2022-03-05 DIAGNOSIS — R41840 Attention and concentration deficit: Secondary | ICD-10-CM | POA: Diagnosis not present

## 2022-03-05 DIAGNOSIS — E785 Hyperlipidemia, unspecified: Secondary | ICD-10-CM

## 2022-03-05 LAB — COMPREHENSIVE METABOLIC PANEL
ALT: 22 U/L (ref 0–35)
AST: 15 U/L (ref 0–37)
Albumin: 4.1 g/dL (ref 3.5–5.2)
Alkaline Phosphatase: 70 U/L (ref 39–117)
BUN: 12 mg/dL (ref 6–23)
CO2: 27 mEq/L (ref 19–32)
Calcium: 8.9 mg/dL (ref 8.4–10.5)
Chloride: 98 mEq/L (ref 96–112)
Creatinine, Ser: 0.75 mg/dL (ref 0.40–1.20)
GFR: 96.91 mL/min (ref >60.00)
Glucose, Bld: 395 mg/dL — ABNORMAL HIGH (ref 70–99)
Potassium: 3.7 mEq/L (ref 3.5–5.1)
Sodium: 137 mEq/L (ref 135–145)
Total Bilirubin: 0.7 mg/dL (ref 0.2–1.2)
Total Protein: 6.1 g/dL (ref 6.0–8.3)

## 2022-03-05 LAB — MICROALBUMIN / CREATININE URINE RATIO
Creatinine,U: 17.7 mg/dL
Microalb Creat Ratio: 4 mg/g (ref 0.0–30.0)
Microalb, Ur: <0.7 mg/dL (ref 0.0–1.9)

## 2022-03-05 LAB — CBC
HCT: 40.9 % (ref 36.0–46.0)
Hemoglobin: 13.6 g/dL (ref 12.0–15.0)
MCHC: 33.2 g/dL (ref 30.0–36.0)
MCV: 84 fl (ref 78.0–100.0)
Platelets: 178 10*3/uL (ref 150.0–400.0)
RBC: 4.87 Mil/uL (ref 3.87–5.11)
RDW: 18.3 % — ABNORMAL HIGH (ref 11.5–15.5)
WBC: 6.3 10*3/uL (ref 4.0–10.5)

## 2022-03-05 LAB — LDL CHOLESTEROL, DIRECT: Direct LDL: 61 mg/dL

## 2022-03-05 LAB — HEMOGLOBIN A1C: Hgb A1c MFr Bld: 9.5 % — ABNORMAL HIGH (ref 4.6–6.5)

## 2022-03-05 LAB — LIPID PANEL
Cholesterol: 143 mg/dL (ref 0–200)
HDL: 26.8 mg/dL — ABNORMAL LOW (ref >39.00)
NonHDL: 115.96
Total CHOL/HDL Ratio: 5
Triglycerides: 350 mg/dL — ABNORMAL HIGH (ref 0.0–149.0)
VLDL: 70 mg/dL — ABNORMAL HIGH (ref 0.0–40.0)

## 2022-03-05 LAB — T4, FREE: Free T4: 0.84 ng/dL (ref 0.60–1.60)

## 2022-03-05 LAB — TSH: TSH: 3.32 u[IU]/mL (ref 0.35–5.50)

## 2022-03-05 MED ORDER — ATORVASTATIN CALCIUM 40 MG PO TABS: 40.0000 mg | ORAL_TABLET | Freq: Every day | ORAL | 3 refills | Status: DC

## 2022-03-05 MED ORDER — VENLAFAXINE HCL ER 75 MG PO CP24: 225.0000 mg | ORAL_CAPSULE | Freq: Every day | ORAL | 1 refills | Status: DC

## 2022-03-05 MED ORDER — FENOFIBRATE MICRONIZED 134 MG PO CAPS: 134.0000 mg | ORAL_CAPSULE | Freq: Every day | ORAL | 3 refills | Status: DC

## 2022-03-05 NOTE — Telephone Encounter (Signed)
Pt needs appt for further refills.

## 2022-03-05 NOTE — Progress Notes (Unsigned)
Patient ID: Melissa Gray, female  DOB: [birth year], 47 y.o.   MRN: [id number] Patient Care Team    Relationship Specialty Notifications Start End  Ma Hillock, DO PCP - General Family Medicine  06/03/15   Lavena Bullion, DO Consulting Physician Gastroenterology  11/16/17   Nunzio Cobbs, MD Consulting Physician Obstetrics and Gynecology  03/28/20     Chief Complaint  Patient presents with   ADHD    Subjective: Melissa Gray is a 47 y.o.  Female  present for Chronic Conditions/illness Management All past medical history, surgical history, allergies, family history, immunizations, medications and social history were updated in the electronic medical record today. All recent labs, ED visits and hospitalizations within the last year were reviewed.  ADD/anxiety/depression: Patient reports compliance with Adderall 5 mg twice daily in hopes that her pharmacy have back in stock.  She feels the Effexor to 225 mg is still working well for her. Prior note Pt reports he was provided with amphetamine salts 10 mg for replacement of her Adderall 10 mg secondary to factory backorder.  Patient reports she did not feel it worked very well for her.  She even took a second dose and still did not feel it worked as well.  Typically she is prescribed Adderall 10 mg, half a tab twice daily.  She is present today to discuss other options. Prior note: Pt presents for an OV to discuss her attention and concentration difficulty. She reports onset as young as first grade. Unfortunately she was never diagnosed at that time. She recently was seen by a psychologist who completed an ADHD evaluation on her and it was positive. She would like to discuss medication start today. ADHD screening tool completed today and positive     03/05/2022    1:05 PM 09/17/2021    8:21 AM 04/02/2021    8:10 AM 10/28/2020    9:06 AM 03/28/2020    1:10 PM  Depression screen PHQ 2/9  Decreased Interest 1 1 1 1 1  $ Down,  Depressed, Hopeless 1 1 0 1 1  PHQ - 2 Score 2 2 1 2 2  $ Altered sleeping 2 2 2 2 2  $ Tired, decreased energy 1 1 1 1 2  $ Change in appetite 1 1 1 1 1  $ Feeling bad or failure about yourself  1 1 1 1 1  $ Trouble concentrating 1 1 1 1 1  $ Moving slowly or fidgety/restless 0 1 0 0 1  Suicidal thoughts 0 0 0 0 0  PHQ-9 Score 8 9 7 8 10      $ 03/05/2022    1:05 PM 09/17/2021    8:21 AM 04/02/2021    8:11 AM 10/28/2020    9:17 AM  GAD 7 : Generalized Anxiety Score  Nervous, Anxious, on Edge 1 1 2 1  $ Control/stop worrying 1 1 1 1  $ Worry too much - different things 2 1 1 1  $ Trouble relaxing 2 1 1 1  $ Restless 2 1 1 1  $ Easily annoyed or irritable 1 1 2 1  $ Afraid - awful might happen 0 1 0 1  Total GAD 7 Score 9 7 8 7   $ Immunization History  Administered Date(s) Administered   Influenza Inj Mdck Quad Pf 11/18/2017   Influenza,inj,Quad PF,6+ Mos 11/29/2016, 02/02/2019, 09/27/2019, 10/28/2020, 09/17/2021   Influenza-Unspecified 12/01/2015, 11/29/2016   PFIZER(Purple Top)SARS-COV-2 Vaccination 04/10/2019, 05/01/2019, 11/01/2019, 10/04/2020   Pneumococcal Conjugate-13 07/02/2015   Pneumococcal Polysaccharide-23 06/17/2016   Tdap 04/02/2021  Past Medical History:  Diagnosis Date   Anxiety    COVID-19 07/2018   Depression    Diabetes mellitus without complication ([place])    Elevated cholesterol    Gallstones    GERD (gastroesophageal reflux disease)    Hematochezia 10/2017   Plan for colonoscopy   Hyperplastic colon polyp 11/2017   IBS (irritable bowel syndrome)    diarrhea predominant   Infertility, female    Obesity    Thyroid disease 2004   treated for years, stopped 2014 per new MD   Allergies  Allergen Reactions   Tape Other (See Comments)    Adhesive tape causes blisters   Levothyroxine Rash    Generic brand only caused rash   Past Surgical History:  Procedure Laterality Date   CHOLECYSTECTOMY  2006   CYST EXCISION     GLOMUS TUMOR EXCISION  2005, 2012   finger and cheek    Family History  Problem Relation Age of Onset   Diabetes Mother    Depression Mother    Obesity Mother    COPD Father    Heart disease Father    Early death Father    Diabetes Father    Obesity Father    Diabetes Brother    Diabetes Maternal Grandmother    Hearing loss Maternal Grandmother    Dementia Maternal Grandmother    Early death Maternal Grandfather    Cancer Maternal Grandfather    Breast cancer Paternal Grandmother    Cancer Paternal Grandmother    Early death Paternal Grandmother    Heart disease Paternal Grandfather    Breast cancer Paternal Aunt    Heart disease Paternal Uncle    Heart attack Paternal Uncle    Breast cancer Paternal Aunt    Alcohol abuse Paternal Aunt    Alzheimer's disease Paternal Aunt    Alcohol abuse Paternal Aunt    Cancer Maternal Aunt    Obesity Maternal Aunt    Colon cancer Neg Hx    Esophageal cancer Neg Hx    Rectal cancer Neg Hx    Stomach cancer Neg Hx    Social History   Social History Narrative   Married. Husband's name is Audelia Acton. No children.   2 pets.   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   Drinks caffeine   Wears her seatbelt   Smoke detector in home   Safe in her relationship.    Allergies as of 03/05/2022       Reactions   Tape Other (See Comments)   Adhesive tape causes blisters   Levothyroxine Rash   Generic brand only caused rash        Medication List        Accurate as of March 05, 2022  1:25 PM. If you have any questions, ask your nurse or doctor.          STOP taking these medications    fluconazole 150 MG tablet Commonly known as: DIFLUCAN Stopped by: Howard Pouch, DO   fluticasone 50 MCG/ACT nasal spray Commonly known as: FLONASE Stopped by: Howard Pouch, DO   methylphenidate 18 MG CR tablet Commonly known as: Concerta Stopped by: Howard Pouch, DO   potassium chloride SA 20 MEQ tablet Commonly known as: KLOR-CON M Stopped by: Howard Pouch, DO        TAKE these medications    amphetamine-dextroamphetamine 10 MG tablet Commonly known as: Adderall Half of a tab twice daily   amphetamine-dextroamphetamine 10 MG tablet Commonly known  as: Adderall 1/2 tab BID   atorvastatin 40 MG tablet Commonly known as: LIPITOR Take 1 tablet (40 mg total) by mouth daily.   dapagliflozin propanediol 10 MG Tabs tablet Commonly known as: Farxiga Take 1 tablet (10 mg total) by mouth daily before breakfast.   fenofibrate micronized 134 MG capsule Commonly known as: LOFIBRA Take 1 capsule (134 mg total) by mouth daily.   FISH OIL PO Take 3,000 mg by mouth.   FreeStyle Libre 3 Sensor Misc 1 each by Does not apply route every 14 (fourteen) days.   glimepiride 4 MG tablet Commonly known as: AMARYL Take 2 tablets (8 mg total) by mouth every morning.   MULTIVITAMIN ADULT PO Take by mouth.   Ozempic (1 MG/DOSE) 4 MG/3ML Sopn Generic drug: Semaglutide (1 MG/DOSE) Inject 1 mg into the skin once a week.   venlafaxine XR 75 MG 24 hr capsule Commonly known as: Effexor XR Take 3 capsules (225 mg total) by mouth daily with breakfast.        All past medical history, surgical history, allergies, family history, immunizations andmedications were updated in the EMR today and reviewed under the history and medication portions of their EMR.     No results found for this or any previous visit (from the past 2160 hour(s)).   ROS 14 pt review of systems performed and negative (unless mentioned in an HPI)  Objective: BP 128/78   Pulse 87   Temp 98.4 F (36.9 C)   Ht 5' 6"$  (1.676 m)   Wt 218 lb (98.9 kg)   SpO2 98%   BMI 35.19 kg/m  Physical Exam Vitals and nursing note reviewed.  Constitutional:      General: She is not in acute distress.    Appearance: Normal appearance. She is normal weight. She is not ill-appearing, toxic-appearing or diaphoretic.  HENT:     Head: Normocephalic and atraumatic.  Eyes:     General: No scleral  icterus.       Right eye: No discharge.        Left eye: No discharge.     Extraocular Movements: Extraocular movements intact.     Conjunctiva/sclera: Conjunctivae normal.     Pupils: Pupils are equal, round, and reactive to light.  Cardiovascular:     Rate and Rhythm: Normal rate and regular rhythm.  Pulmonary:     Effort: Pulmonary effort is normal. No respiratory distress.     Breath sounds: Normal breath sounds. No wheezing, rhonchi or rales.  Musculoskeletal:     Right lower leg: No edema.     Left lower leg: No edema.  Skin:    General: Skin is warm.     Findings: No rash.  Neurological:     Mental Status: She is alert and oriented to person, place, and time. Mental status is at baseline.     Motor: No weakness.     Gait: Gait normal.  Psychiatric:        Mood and Affect: Mood normal.        Behavior: Behavior normal.        Thought Content: Thought content normal.        Judgment: Judgment normal.    Diabetic Foot Exam - Simple   Simple Foot Form Diabetic Foot exam was performed with the following findings: Yes 03/05/2022  1:04 PM  Visual Inspection No deformities, no ulcerations, no other skin breakdown bilaterally: Yes Sensation Testing Intact to touch and monofilament testing bilaterally: Yes Pulse Check  Posterior Tibialis and Dorsalis pulse intact bilaterally: Yes Comments     No results found.  Assessment/plan: Melayna Raspanti is a 47 y.o. female present for  Type 2 diabetes mellitus with hyperlipidemia (HCC)/4Morbid obesity ([place]) Managed by endocrine microalbumin collected today. CBC, CMP, A1c, TSH/T4 free and lipids collected today. Continue atorvastatin 40 mg daily Continue fenofibrate 134 mg daily  Attention and concentration deficit/Depression with anxiety Stable Continue  Adderall 5 mg twice daily.  Continue Effexor 225 mg daily [place] controlled substance database was reviewed 03/05/22 Adderall 10 mg-half tab twice daily, #  90   Orders Placed This Encounter  Procedures   Urine Microalbumin w/creat. ratio   Comp Met (CMET)   Hemoglobin A1c   TSH   CBC   Lipid panel   T4, free   Meds ordered this encounter  Medications   atorvastatin (LIPITOR) 40 MG tablet    Sig: Take 1 tablet (40 mg total) by mouth daily.    Dispense:  90 tablet    Refill:  3   fenofibrate micronized (LOFIBRA) 134 MG capsule    Sig: Take 1 capsule (134 mg total) by mouth daily.    Dispense:  90 capsule    Refill:  3   venlafaxine XR (EFFEXOR XR) 75 MG 24 hr capsule    Sig: Take 3 capsules (225 mg total) by mouth daily with breakfast.    Dispense:  270 capsule    Refill:  1   Referral Orders  No referral(s) requested today     Electronically signed by: Howard Pouch, [place]

## 2022-03-05 NOTE — Patient Instructions (Addendum)
Return in about 24 weeks (around 08/20/2022) for Routine chronic condition follow-up.        Great to see you today.  I have refilled the medication(s) we provide.   If labs were collected, we will inform you of lab results once received either by echart message or telephone call.   - echart message- for normal results that have been seen by the patient already.   - telephone call: abnormal results or if patient has not viewed results in their echart.

## 2022-03-08 ENCOUNTER — Telehealth: Payer: Self-pay | Admitting: Family Medicine

## 2022-03-08 MED ORDER — AMPHETAMINE-DEXTROAMPHETAMINE 10 MG PO TABS: ORAL_TABLET | ORAL | 0 refills | Status: DC

## 2022-03-08 MED ORDER — AMPHETAMINE-DEXTROAMPHETAMINE 10 MG PO TABS
ORAL_TABLET | ORAL | 0 refills | Status: DC
Start: 2022-03-08 — End: 2022-08-20

## 2022-03-08 NOTE — Telephone Encounter (Signed)
LM for pt to return call to discuss.  

## 2022-03-08 NOTE — Telephone Encounter (Signed)
Please call patient Her cholesterol panel is at goal, with the exception of her triglycerides.  However she was not fasting at her appointment, therefore triglycerides can look falsely elevated.  The fenofibrate medication helps focusing on the triglycerides. Liver, kidney and thyroid function are normal. She has normal levels of protein in her urine. Her A1c is extremely elevated at 9.5.  I would encourage her to call her endocrinologist and be seen a soon as possible.

## 2022-03-09 NOTE — Telephone Encounter (Signed)
Pt viewed on mychart

## 2022-03-09 NOTE — Telephone Encounter (Signed)
LM for pt to return call to discuss.  

## 2022-03-25 ENCOUNTER — Other Ambulatory Visit: Payer: Self-pay | Admitting: Internal Medicine

## 2022-04-01 ENCOUNTER — Telehealth: Payer: Self-pay

## 2022-04-02 ENCOUNTER — Other Ambulatory Visit (HOSPITAL_COMMUNITY): Payer: Self-pay

## 2022-04-02 NOTE — Telephone Encounter (Signed)
This is for a renewal, is pt still on this medication? Last I see prescribed is August 2022. If pt is still on, PA shouldn't be required. Per test claim:

## 2022-05-13 ENCOUNTER — Telehealth: Payer: BC Managed Care – PPO | Admitting: Physician Assistant

## 2022-05-13 ENCOUNTER — Encounter: Payer: Self-pay | Admitting: Family Medicine

## 2022-05-13 DIAGNOSIS — J019 Acute sinusitis, unspecified: Secondary | ICD-10-CM

## 2022-05-13 NOTE — Progress Notes (Signed)
Patient is out of town.

## 2022-05-24 ENCOUNTER — Other Ambulatory Visit: Payer: Self-pay | Admitting: Internal Medicine

## 2022-08-20 ENCOUNTER — Ambulatory Visit: Payer: BC Managed Care – PPO | Admitting: Family Medicine

## 2022-08-20 ENCOUNTER — Encounter: Payer: Self-pay | Admitting: Family Medicine

## 2022-08-20 VITALS — BP 135/85 | HR 93 | Temp 97.7°F | Wt 204.6 lb

## 2022-08-20 DIAGNOSIS — R41840 Attention and concentration deficit: Secondary | ICD-10-CM | POA: Diagnosis not present

## 2022-08-20 DIAGNOSIS — F418 Other specified anxiety disorders: Secondary | ICD-10-CM

## 2022-08-20 MED ORDER — AMPHETAMINE-DEXTROAMPHETAMINE 10 MG PO TABS: ORAL_TABLET | ORAL | 0 refills | Status: DC

## 2022-08-20 MED ORDER — VENLAFAXINE HCL ER 75 MG PO CP24: 225.0000 mg | ORAL_CAPSULE | Freq: Every day | ORAL | 1 refills | Status: DC

## 2022-08-20 NOTE — Patient Instructions (Addendum)
Return in about 24 weeks (around 02/04/2023) for cpe (20 min), Routine chronic condition follow-up.        Great to see you today.  I have refilled the medication(s) we provide.   If labs were collected or images ordered, we will inform you of  results once we have received them and reviewed. We will contact you either by echart message, or telephone call.  Please give ample time to the testing facility, and our office to run,  receive and review results. Please do not call inquiring of results, even if you can see them in your chart. We will contact you as soon as we are able. If it has been over 1 week since the test was completed, and you have not yet heard from [place], then please call us.    - echart message- for normal results that have been seen by the patient already.   - telephone call: abnormal results or if patient has not viewed results in their echart.  If a referral to a specialist was entered for you, please call us in 2 weeks if you have not heard from the specialist office to schedule.

## 2022-08-20 NOTE — Progress Notes (Signed)
Patient ID: Melissa Gray, female  DOB: 05/13/1975, 47 y.o.   MRN: [id number] Patient Care Team    Relationship Specialty Notifications Start End  Natalia Leatherwood, DO PCP - General Family Medicine  06/03/15   Shellia Cleverly, DO Consulting Physician Gastroenterology  11/16/17   Patton Salles, MD Consulting Physician Obstetrics and Gynecology  03/28/20     Chief Complaint  Patient presents with   Depression    Subjective: Melissa Gray is a 47 y.o.  Female  present for Chronic Conditions/illness Management All past medical history, surgical history, allergies, family history, immunizations, medications and social history were updated in the electronic medical record today. All recent labs, ED visits and hospitalizations within the last year were reviewed.  ADD/anxiety/depression: Patient reports compliance with Adderall 5 mg twice daily in hopes that her pharmacy have back in stock.  She feels the Effexor to 225 mg is still working well for her. Prior note Pt reports he was provided with amphetamine salts 10 mg for replacement of her Adderall 10 mg secondary to factory backorder.  Patient reports she did not feel it worked very well for her.  She even took a second dose and still did not feel it worked as well.  Typically she is prescribed Adderall 10 mg, half a tab twice daily.  She is present today to discuss other options. Prior note: Pt presents for an OV to discuss her attention and concentration difficulty. She reports onset as young as first grade. Unfortunately she was never diagnosed at that time. She recently was seen by a psychologist who completed an ADHD evaluation on her and it was positive. She would like to discuss medication start today. ADHD screening tool completed today and positive     08/20/2022    9:00 AM 03/05/2022    1:05 PM 09/17/2021    8:21 AM 04/02/2021    8:10 AM 10/28/2020    9:06 AM  Depression screen PHQ 2/9  Decreased Interest 1 1 1 1 1    Down, Depressed, Hopeless 1 1 1  0 1  PHQ - 2 Score 2 2 2 1 2   Altered sleeping 2 2 2 2 2   Tired, decreased energy 2 1 1 1 1   Change in appetite 1 1 1 1 1   Feeling bad or failure about yourself  1 1 1 1 1   Trouble concentrating 1 1 1 1 1   Moving slowly or fidgety/restless 0 0 1 0 0  Suicidal thoughts 0 0 0 0 0  PHQ-9 Score 9 8 9 7 8       03/05/2022    1:05 PM 09/17/2021    8:21 AM 04/02/2021    8:11 AM 10/28/2020    9:17 AM  GAD 7 : Generalized Anxiety Score  Nervous, Anxious, on Edge 1 1 2 1   Control/stop worrying 1 1 1 1   Worry too much - different things 2 1 1 1   Trouble relaxing 2 1 1 1   Restless 2 1 1 1   Easily annoyed or irritable 1 1 2 1   Afraid - awful might happen 0 1 0 1  Total GAD 7 Score 9 7 8 7    Immunization History  Administered Date(s) Administered   Covid-19, Mrna,Vaccine(Spikevax)23yrs and older 03/05/2022   Influenza Inj Mdck Quad Pf 11/18/2017   Influenza,inj,Quad PF,6+ Mos 11/29/2016, 02/02/2019, 09/27/2019, 10/28/2020, 09/17/2021   Influenza-Unspecified 12/01/2015, 11/29/2016   PFIZER(Purple Top)SARS-COV-2 Vaccination 04/10/2019, 05/01/2019, 11/01/2019, 10/04/2020   Pneumococcal Conjugate-13 07/02/2015  Pneumococcal Polysaccharide-23 06/17/2016   Tdap 04/02/2021   Past Medical History:  Diagnosis Date   Anxiety    COVID-19 07/2018   Depression    Diabetes mellitus without complication (HCC)    Elevated cholesterol    Gallstones    GERD (gastroesophageal reflux disease)    Hematochezia 10/2017   Plan for colonoscopy   Hyperplastic colon polyp 11/2017   IBS (irritable bowel syndrome)    diarrhea predominant   Infertility, female    Obesity    Thyroid disease 2004   treated for years, stopped 2014 per new MD   Allergies  Allergen Reactions   Tape Other (See Comments)    Adhesive tape causes blisters   Levothyroxine Rash    Generic brand only caused rash   Past Surgical History:  Procedure Laterality Date   CHOLECYSTECTOMY  2006   CYST  EXCISION     GLOMUS TUMOR EXCISION  2005, 2012   finger and cheek   Family History  Problem Relation Age of Onset   Diabetes Mother    Depression Mother    Obesity Mother    COPD Father    Heart disease Father    Early death Father    Diabetes Father    Obesity Father    Diabetes Brother    Diabetes Maternal Grandmother    Hearing loss Maternal Grandmother    Dementia Maternal Grandmother    Early death Maternal Grandfather    Cancer Maternal Grandfather    Breast cancer Paternal Grandmother    Cancer Paternal Grandmother    Early death Paternal Grandmother    Heart disease Paternal Grandfather    Breast cancer Paternal Aunt    Heart disease Paternal Uncle    Heart attack Paternal Uncle    Breast cancer Paternal Aunt    Alcohol abuse Paternal Aunt    Alzheimer's disease Paternal Aunt    Alcohol abuse Paternal Aunt    Cancer Maternal Aunt    Obesity Maternal Aunt    Colon cancer Neg Hx    Esophageal cancer Neg Hx    Rectal cancer Neg Hx    Stomach cancer Neg Hx    Social History   Social History Narrative   Married. Husband's name is Vincenza Hews. No children.   2 pets.   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   Drinks caffeine   Wears her seatbelt   Smoke detector in home   Safe in her relationship.    Allergies as of 08/20/2022       Reactions   Tape Other (See Comments)   Adhesive tape causes blisters   Levothyroxine Rash   Generic brand only caused rash        Medication List        Accurate as of August 20, 2022  9:12 AM. If you have any questions, ask your nurse or doctor.          amphetamine-dextroamphetamine 10 MG tablet Commonly known as: Adderall 1/2 tab BID What changed: Another medication with the same name was changed. Make sure you understand how and when to take each. Changed by: Felix Pacini   amphetamine-dextroamphetamine 10 MG tablet Commonly known as: Adderall Half of a tab twice daily Start taking on: November 08, 2022 What changed: These instructions start on November 08, 2022. If you are unsure what to do until then, ask your doctor or other care provider. Changed by: Felix Pacini   atorvastatin 40 MG tablet Commonly known as:  LIPITOR Take 1 tablet (40 mg total) by mouth daily.   Farxiga 10 MG Tabs tablet Generic drug: dapagliflozin propanediol TAKE 1 TABLET BY MOUTH DAILY BEFORE BREAKFAST.   fenofibrate micronized 134 MG capsule Commonly known as: LOFIBRA Take 1 capsule (134 mg total) by mouth daily.   FISH OIL PO Take 3,000 mg by mouth.   FreeStyle Libre 3 Sensor Misc 1 each by Does not apply route every 14 (fourteen) days.   glimepiride 4 MG tablet Commonly known as: AMARYL Take 2 tablets (8 mg total) by mouth every morning.   MULTIVITAMIN ADULT PO Take by mouth.   Ozempic (1 MG/DOSE) 4 MG/3ML Sopn Generic drug: Semaglutide (1 MG/DOSE) Inject 1 mg into the skin once a week.   venlafaxine XR 75 MG 24 hr capsule Commonly known as: Effexor XR Take 3 capsules (225 mg total) by mouth daily with breakfast.        All past medical history, surgical history, allergies, family history, immunizations andmedications were updated in the EMR today and reviewed under the history and medication portions of their EMR.       ROS 14 pt review of systems performed and negative (unless mentioned in an HPI)  Objective: BP 135/85   Pulse 93   Temp 97.7 F (36.5 C)   Wt 204 lb 9.6 oz (92.8 kg)   LMP 08/09/2022   SpO2 97%   BMI 33.02 kg/m  Physical Exam Vitals and nursing note reviewed.  Constitutional:      General: She is not in acute distress.    Appearance: Normal appearance. She is normal weight. She is not ill-appearing or toxic-appearing.  HENT:     Head: Normocephalic and atraumatic.  Eyes:     General: No scleral icterus.       Right eye: No discharge.        Left eye: No discharge.     Extraocular Movements: Extraocular movements intact.     Conjunctiva/sclera:  Conjunctivae normal.     Pupils: Pupils are equal, round, and reactive to light.  Skin:    Findings: No rash.  Neurological:     Mental Status: She is alert and oriented to person, place, and time. Mental status is at baseline.     Motor: No weakness.     Coordination: Coordination normal.     Gait: Gait normal.  Psychiatric:        Mood and Affect: Mood normal.        Behavior: Behavior normal.        Thought Content: Thought content normal.        Judgment: Judgment normal.     No results found.  Assessment/plan: Melissa Gray is a 47 y.o. female present for  Type 2 diabetes mellitus with hyperlipidemia (HCC)/4Morbid obesity (HCC) Diabetes managed by endocrine Mild elevation in BP above goal of <130/80- monitor. She reports this week was stressful.  microalbumin UTD 2/24 Foot exam UTD 2/24 Exam UTD 11/2022 Continue atorvastatin 40 mg daily Continue  fenofibrate 134 mg daily  Attention and concentration deficit/Depression with anxiety Stable Continue Adderall 5 mg twice daily.  Continue Effexor 225 mg daily [place] controlled substance database was reviewed 08/20/22 Adderall 10 mg-half tab twice daily, # 90   No orders of the defined types were placed in this encounter.  Meds ordered this encounter  Medications   venlafaxine XR (EFFEXOR XR) 75 MG 24 hr capsule    Sig: Take 3 capsules (225 mg total) by mouth daily  with breakfast.    Dispense:  270 capsule    Refill:  1   amphetamine-dextroamphetamine (ADDERALL) 10 MG tablet    Sig: Half of a tab twice daily    Dispense:  90 tablet    Refill:  0    May fill approximately 85 days after prescribed 8   amphetamine-dextroamphetamine (ADDERALL) 10 MG tablet    Sig: 1/2 tab BID    Dispense:  90 tablet    Refill:  0   Referral Orders  No referral(s) requested today     Electronically signed by: Felix Pacini, DO [facility] [place]

## 2022-10-01 ENCOUNTER — Other Ambulatory Visit: Payer: Self-pay | Admitting: Medical Genetics

## 2022-10-01 DIAGNOSIS — Z006 Encounter for examination for normal comparison and control in clinical research program: Secondary | ICD-10-CM

## 2023-01-04 ENCOUNTER — Encounter: Payer: Self-pay | Admitting: Family Medicine

## 2023-01-04 MED ORDER — VENLAFAXINE HCL ER 75 MG PO CP24: 225.0000 mg | ORAL_CAPSULE | Freq: Every day | ORAL | 0 refills | Status: DC

## 2023-01-26 ENCOUNTER — Other Ambulatory Visit: Payer: Self-pay | Admitting: Family Medicine

## 2023-01-30 ENCOUNTER — Other Ambulatory Visit: Payer: Self-pay | Admitting: Family Medicine

## 2023-02-09 ENCOUNTER — Ambulatory Visit (INDEPENDENT_AMBULATORY_CARE_PROVIDER_SITE_OTHER): Payer: BC Managed Care – PPO | Admitting: Family Medicine

## 2023-02-09 ENCOUNTER — Encounter: Payer: Self-pay | Admitting: Family Medicine

## 2023-02-09 ENCOUNTER — Other Ambulatory Visit: Payer: BC Managed Care – PPO

## 2023-02-09 ENCOUNTER — Telehealth: Payer: Self-pay

## 2023-02-09 VITALS — BP 118/70 | HR 92 | Temp 97.6°F | Ht 66.0 in | Wt 194.2 lb

## 2023-02-09 DIAGNOSIS — Z8481 Family history of carrier of genetic disease: Secondary | ICD-10-CM

## 2023-02-09 DIAGNOSIS — F418 Other specified anxiety disorders: Secondary | ICD-10-CM | POA: Diagnosis not present

## 2023-02-09 DIAGNOSIS — E785 Hyperlipidemia, unspecified: Secondary | ICD-10-CM | POA: Diagnosis not present

## 2023-02-09 DIAGNOSIS — R41840 Attention and concentration deficit: Secondary | ICD-10-CM

## 2023-02-09 DIAGNOSIS — Z1231 Encounter for screening mammogram for malignant neoplasm of breast: Secondary | ICD-10-CM | POA: Diagnosis not present

## 2023-02-09 DIAGNOSIS — R7309 Other abnormal glucose: Secondary | ICD-10-CM | POA: Diagnosis not present

## 2023-02-09 DIAGNOSIS — E1169 Type 2 diabetes mellitus with other specified complication: Secondary | ICD-10-CM | POA: Diagnosis not present

## 2023-02-09 DIAGNOSIS — Z0000 Encounter for general adult medical examination without abnormal findings: Secondary | ICD-10-CM | POA: Diagnosis not present

## 2023-02-09 DIAGNOSIS — E039 Hypothyroidism, unspecified: Secondary | ICD-10-CM

## 2023-02-09 DIAGNOSIS — Z7985 Long-term (current) use of injectable non-insulin antidiabetic drugs: Secondary | ICD-10-CM

## 2023-02-09 LAB — CBC
HCT: 45.1 % (ref 36.0–46.0)
Hemoglobin: 14.8 g/dL (ref 12.0–15.0)
MCHC: 32.8 g/dL (ref 30.0–36.0)
MCV: 83.5 fL (ref 78.0–100.0)
Platelets: 206 10*3/uL (ref 150.0–400.0)
RBC: 5.4 Mil/uL — ABNORMAL HIGH (ref 3.87–5.11)
RDW: 16.4 % — ABNORMAL HIGH (ref 11.5–15.5)
WBC: 7.1 10*3/uL (ref 4.0–10.5)

## 2023-02-09 LAB — COMPREHENSIVE METABOLIC PANEL
ALT: 26 U/L (ref 0–35)
AST: 16 U/L (ref 0–37)
Albumin: 4.4 g/dL (ref 3.5–5.2)
Alkaline Phosphatase: 102 U/L (ref 39–117)
BUN: 13 mg/dL (ref 6–23)
CO2: 31 meq/L (ref 19–32)
Calcium: 9.6 mg/dL (ref 8.4–10.5)
Chloride: 97 meq/L (ref 96–112)
Creatinine, Ser: 0.74 mg/dL (ref 0.40–1.20)
GFR: 97.84 mL/min (ref >60.00)
Glucose, Bld: 481 mg/dL — ABNORMAL HIGH (ref 70–99)
Potassium: 4 meq/L (ref 3.5–5.1)
Sodium: 136 meq/L (ref 135–145)
Total Bilirubin: 1 mg/dL (ref 0.2–1.2)
Total Protein: 6.7 g/dL (ref 6.0–8.3)

## 2023-02-09 LAB — TSH: TSH: 5.54 u[IU]/mL — ABNORMAL HIGH (ref 0.35–5.50)

## 2023-02-09 LAB — MICROALBUMIN / CREATININE URINE RATIO
Creatinine,U: 21.3 mg/dL
Microalb Creat Ratio: 3.3 mg/g (ref 0.0–30.0)
Microalb, Ur: <0.7 mg/dL (ref 0.0–1.9)

## 2023-02-09 LAB — LIPID PANEL
Cholesterol: 164 mg/dL (ref 0–200)
HDL: 27.8 mg/dL — ABNORMAL LOW (ref >39.00)
NonHDL: 136.02
Total CHOL/HDL Ratio: 6
Triglycerides: 610 mg/dL — ABNORMAL HIGH (ref 0.0–149.0)
VLDL: 122 mg/dL — ABNORMAL HIGH (ref 0.0–40.0)

## 2023-02-09 LAB — VITAMIN D 25 HYDROXY (VIT D DEFICIENCY, FRACTURES): VITD: 15.02 ng/mL — ABNORMAL LOW (ref 30.00–100.00)

## 2023-02-09 LAB — LDL CHOLESTEROL, DIRECT: Direct LDL: 60 mg/dL

## 2023-02-09 MED ORDER — VENLAFAXINE HCL ER 75 MG PO CP24: 225.0000 mg | ORAL_CAPSULE | Freq: Every day | ORAL | 1 refills | Status: DC

## 2023-02-09 MED ORDER — FLUOXETINE HCL 20 MG PO CAPS: 20.0000 mg | ORAL_CAPSULE | Freq: Every day | ORAL | 1 refills | Status: DC

## 2023-02-09 MED ORDER — AMPHETAMINE-DEXTROAMPHETAMINE 10 MG PO TABS: ORAL_TABLET | ORAL | 0 refills | Status: DC

## 2023-02-09 MED ORDER — ATORVASTATIN CALCIUM 40 MG PO TABS: 40.0000 mg | ORAL_TABLET | Freq: Every day | ORAL | 3 refills | Status: AC

## 2023-02-09 MED ORDER — FENOFIBRATE MICRONIZED 134 MG PO CAPS: 134.0000 mg | ORAL_CAPSULE | Freq: Every day | ORAL | 3 refills | Status: AC

## 2023-02-09 NOTE — Patient Instructions (Addendum)
 Return in about 24 weeks (around 07/27/2023) for Routine chronic condition follow-up.        Great to see you today.  I have refilled the medication(s) we provide.   If labs were collected or images ordered, we will inform you of  results once we have received them and reviewed. We will contact you either by echart message, or telephone call.  Please give ample time to the testing facility, and our office to run,  receive and review results. Please do not call inquiring of results, even if you can see them in your chart. We will contact you as soon as we are able. If it has been over 1 week since the test was completed, and you have not yet heard from us , then please call us .    - echart message- for normal results that have been seen by the patient already.   - telephone call: abnormal results or if patient has not viewed results in their echart.  If a referral to a specialist was entered for you, please call us  in 2 weeks if you have not heard from the specialist office to schedule.

## 2023-02-09 NOTE — Telephone Encounter (Signed)
 Reason for CRM: Mel from [facility] called in stating that Patient hasn't been seen since nov 2023 reached out for a follow up but never called back to schedule will fax over the last medical records that the patient has had

## 2023-02-09 NOTE — Progress Notes (Signed)
 Patient ID: Melissa Gray, female  DOB: 01/07/1976, 48 y.o.   MRN: [id number] Patient Care Team    Relationship Specialty Notifications Start End  Mariel Shope, DO PCP - General Family Medicine  06/03/15   Annis Kinder, DO Consulting Physician Gastroenterology  11/16/17   Greta Leatherwood, MD Consulting Physician Obstetrics and Gynecology  03/28/20     Chief Complaint  Patient presents with   Annual Exam    Pt is fasting    Subjective: Melissa Gray is a 48 y.o.  Female  present for CPE and chronic condition management combined appointment All past medical history, surgical history, allergies, family history, immunizations, medications and social history were updated in the electronic medical record today. All recent labs, ED visits and hospitalizations within the last year were reviewed.  Health maintenance:  Colonoscopy: No FHX, routine screening at age 60 Mammogram: FHX present aunt was positive for BRCA-->08/2020- [facility]> records requested from GYN- ordered today Cervical cancer screening: 2020-72yr, no abnl pap. Completed today. Dr. Colvin Dec.  Any updated records requested from gynecology Immunizations:  tdap-UTD 2023, influenza vaccine UTD 2024. PNA UTD, covid UTD Infectious disease screening: HIV and Hep C completed  DEXA: routine screen at 62. Patient has a Dental home. Hospitalizations/ED visits: reviewed  ADD/anxiety/depression: Patient reports compliance with Adderall 5 mg twice daily in hopes that her pharmacy have back in stock.  She feels the Effexor  to 225 mg is still not working  as well and she is feeling extra irritated over the last few months. She is finding her self tearful and overreacting to situations Prior note Pt reports he was provided with amphetamine  salts 10 mg for replacement of her Adderall 10 mg secondary to factory backorder.  Patient reports she did not feel it worked very well for her.  She even took a second dose and still  did not feel it worked as well.  Typically she is prescribed Adderall 10 mg, half a tab twice daily.  She is present today to discuss other options. Prior note: Pt presents for an OV to discuss her attention and concentration difficulty. She reports onset as young as first grade. Unfortunately she was never diagnosed at that time. She recently was seen by a psychologist who completed an ADHD evaluation on her and it was positive. She would like to discuss medication start today. ADHD screening tool completed today and positive  type 2 diabetes mellitus with hyperlipidemia/)/Morbid obesity (HCC) Diabetes now managed by endocrine.  Patient reports compliance with Lipitor, omega-3 and fenofibrate            02/09/2023    8:10 AM 08/20/2022    9:00 AM 03/05/2022    1:05 PM 09/17/2021    8:21 AM 04/02/2021    8:10 AM  Depression screen PHQ 2/9  Decreased Interest 2 1 1 1 1   Down, Depressed, Hopeless 2 1 1 1  0  PHQ - 2 Score 4 2 2 2 1   Altered sleeping 2 2 2 2 2   Tired, decreased energy 0 2 1 1 1   Change in appetite 0 1 1 1 1   Feeling bad or failure about yourself  2 1 1 1 1   Trouble concentrating 2 1 1 1 1   Moving slowly or fidgety/restless 0 0 0 1 0  Suicidal thoughts 0 0 0 0 0  PHQ-9 Score 10 9 8 9 7   Difficult doing work/chores Very difficult  02/09/2023    8:10 AM 03/05/2022    1:05 PM 09/17/2021    8:21 AM 04/02/2021    8:11 AM  GAD 7 : Generalized Anxiety Score  Nervous, Anxious, on Edge 2 1 1 2   Control/stop worrying 2 1 1 1   Worry too much - different things 2 2 1 1   Trouble relaxing 2 2 1 1   Restless 0 2 1 1   Easily annoyed or irritable 3 1 1 2   Afraid - awful might happen 0 0 1 0  Total GAD 7 Score 11 9 7 8   Anxiety Difficulty Very difficult      Immunization History  Administered Date(s) Administered   Influenza Inj Mdck Quad Pf 11/18/2017   Influenza,inj,Quad PF,6+ Mos 11/29/2016, 02/02/2019, 09/27/2019, 10/28/2020, 09/17/2021   Influenza-Unspecified 12/01/2015,  11/29/2016, 12/25/2022   Moderna Covid-19 Fall Seasonal Vaccine 95yrs & older 03/05/2022   PFIZER(Purple Top)SARS-COV-2 Vaccination 04/10/2019, 05/01/2019, 11/01/2019, 10/04/2020   Pneumococcal Conjugate-13 07/02/2015   Pneumococcal Polysaccharide-23 06/17/2016   Tdap 04/02/2021   Past Medical History:  Diagnosis Date   Anxiety    COVID-19 07/2018   Depression    Diabetes mellitus without complication (HCC)    Elevated cholesterol    Gallstones    GERD (gastroesophageal reflux disease)    Hematochezia 10/2017   Plan for colonoscopy   Hyperplastic colon polyp 11/2017   IBS (irritable bowel syndrome)    diarrhea predominant   Infertility, female    Obesity    Thyroid  disease 2004   treated for years, stopped 2014 per new MD   Allergies  Allergen Reactions   Tape Other (See Comments)    Adhesive tape causes blisters   Levothyroxine Rash    Generic brand only caused rash   Past Surgical History:  Procedure Laterality Date   CHOLECYSTECTOMY  2006   CYST EXCISION     GLOMUS TUMOR EXCISION  2005, 2012   finger and cheek   Family History  Problem Relation Age of Onset   Diabetes Mother    Depression Mother    Obesity Mother    COPD Father    Heart disease Father    Early death Father    Diabetes Father    Obesity Father    Diabetes Brother    Diabetes Maternal Grandmother    Hearing loss Maternal Grandmother    Dementia Maternal Grandmother    Early death Maternal Grandfather    Cancer Maternal Grandfather    Breast cancer Paternal Grandmother    Cancer Paternal Grandmother    Early death Paternal Grandmother    Heart disease Paternal Grandfather    Breast cancer Paternal Aunt    Heart disease Paternal Uncle    Heart attack Paternal Uncle    Breast cancer Paternal Aunt    Alcohol abuse Paternal Aunt    Alzheimer's disease Paternal Aunt    Alcohol abuse Paternal Aunt    Cancer Maternal Aunt    Obesity Maternal Aunt    Colon cancer Neg Hx    Esophageal  cancer Neg Hx    Rectal cancer Neg Hx    Stomach cancer Neg Hx    Social History   Social History Narrative   Married. Husband's name is Jimmye Moulds. No children.   2 pets.   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   Drinks caffeine   Wears her seatbelt   Smoke detector in home   Safe in her relationship.    Allergies as of 02/09/2023  Reactions   Tape Other (See Comments)   Adhesive tape causes blisters   Levothyroxine Rash   Generic brand only caused rash        Medication List        Accurate as of February 09, 2023  9:14 AM. If you have any questions, ask your nurse or doctor.          amphetamine -dextroamphetamine  10 MG tablet Commonly known as: Adderall 1/2 tab BID What changed: Another medication with the same name was changed. Make sure you understand how and when to take each. Changed by: Napolean Backbone   amphetamine -dextroamphetamine  10 MG tablet Commonly known as: Adderall Half of a tab twice daily Start taking on: April 26, 2023 What changed: These instructions start on April 26, 2023. If you are unsure what to do until then, ask your doctor or other care provider. Changed by: Napolean Backbone   atorvastatin  40 MG tablet Commonly known as: LIPITOR Take 1 tablet (40 mg total) by mouth daily.   Farxiga  10 MG Tabs tablet Generic drug: dapagliflozin  propanediol TAKE 1 TABLET BY MOUTH DAILY BEFORE BREAKFAST.   fenofibrate  micronized 134 MG capsule Commonly known as: LOFIBRA Take 1 capsule (134 mg total) by mouth daily.   FISH OIL PO Take 3,000 mg by mouth.   FLUoxetine  20 MG capsule Commonly known as: PROZAC  Take 1 capsule (20 mg total) by mouth daily. Started by: Napolean Backbone   FreeStyle Libre 3 Sensor Misc 1 each by Does not apply route every 14 (fourteen) days.   glimepiride  4 MG tablet Commonly known as: AMARYL  Take 2 tablets (8 mg total) by mouth every morning.   MULTIVITAMIN ADULT PO Take by mouth.   Ozempic  (1  MG/DOSE) 4 MG/3ML Sopn Generic drug: Semaglutide  (1 MG/DOSE) Inject 1 mg into the skin once a week.   venlafaxine  XR 75 MG 24 hr capsule Commonly known as: Effexor  XR Take 3 capsules (225 mg total) by mouth daily with breakfast.        All past medical history, surgical history, allergies, family history, immunizations andmedications were updated in the EMR today and reviewed under the history and medication portions of their EMR.     No results found for this or any previous visit (from the past 2160 hours).   ROS 14 pt review of systems performed and negative (unless mentioned in an HPI)  Objective: BP 118/70   Pulse 92   Temp 97.6 F (36.4 C)   Ht 5\' 6"  (1.676 m)   Wt 194 lb 3.2 oz (88.1 kg)   SpO2 98%   BMI 31.34 kg/m  Physical Exam Vitals and nursing note reviewed.  Constitutional:      General: She is not in acute distress.    Appearance: Normal appearance. She is obese. She is not ill-appearing or toxic-appearing.  HENT:     Head: Normocephalic and atraumatic.     Right Ear: Tympanic membrane, ear canal and external ear normal. There is no impacted cerumen.     Left Ear: Tympanic membrane, ear canal and external ear normal. There is no impacted cerumen.     Nose: No congestion or rhinorrhea.     Mouth/Throat:     Mouth: Mucous membranes are moist.     Pharynx: Oropharynx is clear. No oropharyngeal exudate or posterior oropharyngeal erythema.  Eyes:     General: No scleral icterus.       Right eye: No discharge.        Left eye:  No discharge.     Extraocular Movements: Extraocular movements intact.     Conjunctiva/sclera: Conjunctivae normal.     Pupils: Pupils are equal, round, and reactive to light.  Cardiovascular:     Rate and Rhythm: Normal rate and regular rhythm.     Pulses: Normal pulses.     Heart sounds: Normal heart sounds. No murmur heard.    No friction rub. No gallop.  Pulmonary:     Effort: Pulmonary effort is normal. No respiratory  distress.     Breath sounds: Normal breath sounds. No stridor. No wheezing, rhonchi or rales.  Chest:     Chest wall: No tenderness.  Abdominal:     General: Abdomen is flat. Bowel sounds are normal. There is no distension.     Palpations: Abdomen is soft. There is no mass.     Tenderness: There is no abdominal tenderness. There is no right CVA tenderness, left CVA tenderness, guarding or rebound.     Hernia: No hernia is present.  Musculoskeletal:        General: No swelling, tenderness or deformity. Normal range of motion.     Cervical back: Normal range of motion and neck supple. No rigidity or tenderness.     Right lower leg: No edema.     Left lower leg: No edema.  Lymphadenopathy:     Cervical: No cervical adenopathy.  Skin:    General: Skin is warm and dry.     Coloration: Skin is not jaundiced or pale.     Findings: No bruising, erythema, lesion or rash.  Neurological:     General: No focal deficit present.     Mental Status: She is alert and oriented to person, place, and time. Mental status is at baseline.     Cranial Nerves: No cranial nerve deficit.     Sensory: No sensory deficit.     Motor: No weakness.     Coordination: Coordination normal.     Gait: Gait normal.     Deep Tendon Reflexes: Reflexes normal.  Psychiatric:        Mood and Affect: Mood normal.        Behavior: Behavior normal.        Thought Content: Thought content normal.        Judgment: Judgment normal.     Diabetic Foot Exam - Simple   Simple Foot Form Diabetic Foot exam was performed with the following findings: Yes 02/09/2023  8:10 AM  Visual Inspection No deformities, no ulcerations, no other skin breakdown bilaterally: Yes Sensation Testing Intact to touch and monofilament testing bilaterally: Yes Pulse Check Posterior Tibialis and Dorsalis pulse intact bilaterally: Yes Comments      No results found.  Assessment/plan: Melissa Gray is a 48 y.o. female present for CPE and  chronic condition appointment Type 2 diabetes mellitus with hyperlipidemia (HCC)/4Morbid obesity (HCC) Diabetes managed by endocrine microalbumin collected 02/09/2023 Foot exam completed 02/09/2023 Eye exam UTD 11/2022-records requested Continue atorvastatin  40 mg daily Continue fenofibrate  134 mg daily A1c, CMP and lipids collected today  Attention and concentration deficit/Depression with anxiety More iriitable Continue Adderall 5 mg twice daily.  Continue Effexor  225 mg daily Start fluoxetine  20 mg qd [place]  controlled substance database was reviewed 02/09/2023 Adderall 10 mg-half tab twice daily, # 90 x 2 -has been Referred to therapist      Hypothyroidism: Currently not taking medications and has had normal TSH off medications.  TSH collected today   FH: BRCA gene  positive/Breast cancer screening by mammogram Ordered today  Routine general medical examination at a health care facility Patient was encouraged to exercise greater than 150 minutes a week. Patient was encouraged to choose a diet filled with fresh fruits and vegetables, and lean meats. AVS provided to patient today for education/recommendation on gender specific health and safety maintenance. Colonoscopy: No FHX, routine screening at age 54 Mammogram: FHX present aunt was positive for BRCA-->08/2020- [facility]> records requested from GYN-ordered Cervical cancer screening: 2020-60yr, no abnl pap. Completed today. Dr. Colvin Dec.  Any updated records requested from gynecology Immunizations:  tdap-UTD 2023, influenza vaccine UTD 2024. PNA UTD, covid UTD Infectious disease screening: HIV and Hep C completed  DEXA: routine screen at 62.  Return in about 24 weeks (around 07/27/2023) for Routine chronic condition follow-up.   Orders Placed This Encounter  Procedures   MM 3D SCREENING MAMMOGRAM BILATERAL BREAST   CBC   Comp Met (CMET)   Hemoglobin A1c   TSH   Lipid panel   Urine Microalbumin w/creat. ratio   Vitamin  D (25 hydroxy)   Meds ordered this encounter  Medications   venlafaxine  XR (EFFEXOR  XR) 75 MG 24 hr capsule    Sig: Take 3 capsules (225 mg total) by mouth daily with breakfast.    Dispense:  90 capsule    Refill:  1   atorvastatin  (LIPITOR) 40 MG tablet    Sig: Take 1 tablet (40 mg total) by mouth daily.    Dispense:  90 tablet    Refill:  3   amphetamine -dextroamphetamine  (ADDERALL) 10 MG tablet    Sig: 1/2 tab BID    Dispense:  90 tablet    Refill:  0   amphetamine -dextroamphetamine  (ADDERALL) 10 MG tablet    Sig: Half of a tab twice daily    Dispense:  90 tablet    Refill:  0   fenofibrate  micronized (LOFIBRA) 134 MG capsule    Sig: Take 1 capsule (134 mg total) by mouth daily.    Dispense:  90 capsule    Refill:  3   FLUoxetine  (PROZAC ) 20 MG capsule    Sig: Take 1 capsule (20 mg total) by mouth daily.    Dispense:  90 capsule    Refill:  1   Referral Orders  No referral(s) requested today    Electronically signed by: Napolean Backbone, DO [facility] [place]

## 2023-02-10 ENCOUNTER — Telehealth: Payer: Self-pay | Admitting: Family Medicine

## 2023-02-10 LAB — HEMOGLOBIN A1C
Est. average glucose Bld gHb Est-mCnc: 349 mg/dL
Hgb A1c MFr Bld: 13.8 % — ABNORMAL HIGH (ref 4.8–5.6)

## 2023-02-10 MED ORDER — VITAMIN D (ERGOCALCIFEROL) 1.25 MG (50000 UNIT) PO CAPS: 50000.0000 [IU] | ORAL_CAPSULE | ORAL | 3 refills | Status: DC

## 2023-02-10 NOTE — Telephone Encounter (Signed)
Please call patient -Liver and kidney function is normal. -LDL/bad cholesterol and total cholesterol normal.  However her triglycerides are significantly high.  Would encourage her to make sure she is taking the Tricor/fenofibrate nightly and omega-3 2000-4000 units daily. Urine protein ratio levels were normal  Vitamin D extremely low at 15.  I have called in a high-dose vitamin D once weekly.  She will need to stay on this indefinitely to keep levels near normal range.  Low vitamin D can cause fatigue and decreased mood.   I would encourage her to discuss with her endocrinologist -Her glucose was significantly high at 481.  A1c is still pending -Her thyroid marker, TSH was mildly elevated to 5.54.  Abnormal TSH levels can affect mood.

## 2023-02-10 NOTE — Telephone Encounter (Signed)
Spoke with patient regarding results/recommendations.  

## 2023-02-10 NOTE — Telephone Encounter (Signed)
Pt has a hemoglobin varient of some kind that the lab can not pick up so it was sent out to [facility]

## 2023-02-11 ENCOUNTER — Encounter: Payer: Self-pay | Admitting: Family Medicine

## 2023-02-11 ENCOUNTER — Telehealth: Payer: Self-pay

## 2023-02-11 NOTE — Telephone Encounter (Signed)
Pt called requesting an appt. She has not been seen since 2023

## 2023-02-15 NOTE — Telephone Encounter (Signed)
LMx1 for patient to call office to schedule appointment with Dr. Elvera Lennox.

## 2023-02-16 NOTE — Telephone Encounter (Signed)
Patient is scheduled for 03/17/2023 at 9:00 Am with Dr. Elvera Lennox.

## 2023-02-17 ENCOUNTER — Ambulatory Visit: Payer: BC Managed Care – PPO | Admitting: Obstetrics and Gynecology

## 2023-02-28 ENCOUNTER — Ambulatory Visit
Admission: RE | Admit: 2023-02-28 | Discharge: 2023-02-28 | Disposition: A | Discharge status: Home / Self Care | Payer: BC Managed Care – PPO | Source: Ambulatory Visit | Attending: Family Medicine | Admitting: Family Medicine

## 2023-02-28 DIAGNOSIS — Z1231 Encounter for screening mammogram for malignant neoplasm of breast: Secondary | ICD-10-CM | POA: Diagnosis not present

## 2023-02-28 DIAGNOSIS — Z8481 Family history of carrier of genetic disease: Secondary | ICD-10-CM

## 2023-03-03 ENCOUNTER — Encounter: Payer: Self-pay | Admitting: Family Medicine

## 2023-03-10 LAB — HM DIABETES EYE EXAM

## 2023-03-14 ENCOUNTER — Encounter: Payer: Self-pay | Admitting: Internal Medicine

## 2023-03-14 ENCOUNTER — Encounter: Payer: Self-pay | Admitting: Family Medicine

## 2023-03-14 MED ORDER — VENLAFAXINE HCL ER 75 MG PO CP24: 225.0000 mg | ORAL_CAPSULE | Freq: Every day | ORAL | 0 refills | Status: DC

## 2023-03-17 ENCOUNTER — Ambulatory Visit: Payer: BC Managed Care – PPO | Admitting: Internal Medicine

## 2023-04-04 DIAGNOSIS — E113211 Type 2 diabetes mellitus with mild nonproliferative diabetic retinopathy with macular edema, right eye: Secondary | ICD-10-CM | POA: Diagnosis not present

## 2023-04-04 DIAGNOSIS — H31093 Other chorioretinal scars, bilateral: Secondary | ICD-10-CM | POA: Diagnosis not present

## 2023-04-04 DIAGNOSIS — E113292 Type 2 diabetes mellitus with mild nonproliferative diabetic retinopathy without macular edema, left eye: Secondary | ICD-10-CM | POA: Diagnosis not present

## 2023-04-04 DIAGNOSIS — H43823 Vitreomacular adhesion, bilateral: Secondary | ICD-10-CM | POA: Diagnosis not present

## 2023-04-20 NOTE — Progress Notes (Signed)
 48 y.o. G27P0000 Married Caucasian female here for annual exam.  Pt does have blood during stool during cycle. Colonscopy and other recommendations confirmed this was fine. Pt is concerned about endometriosis.  Painful menses for one day.  Not having ongoing pelvic pain.  Does have some some low back pain.   Menstruation occurs somewhat irregularly but skipping.  Last cycle lasted 2 weeks.   Has occasional bleeding from the rectum during her menstruation.  Has occurred less than 10 times in the last few years.  Has had colonoscopy which was normal.   She is interested in nonsurgical treatment at this time.  PHQ9:  12. On Adderal and Effexor.  Has depression and generalized anxiety.  Symptoms are worse during her cycle.  Feels she is doing ok overall.  Not suicidal.  Not currently counseling.  A1C is 13.6. Getting care after being off her medication for one year.   Uses Monistat periodically, which helps vulvar symptoms.  Symptoms then can recur.   PCP: Natalia Leatherwood, DO  Endocrinology:  Dr. Elvera Lennox.   Patient's last menstrual period was 04/10/2023.     Period Duration (Days): 14 Period Pattern: (!) Irregular Menstrual Flow: Light, Moderate, Heavy Menstrual Control: Other (Comment) Dysmenorrhea: (!) Moderate     Sexually active: Yes.    The current method of family planning is none.    Menopausal hormone therapy:  n/a Exercising: No.   Smoker:  no  OB History  Gravida Para Term Preterm AB Living  0 0 0 0 0 0  SAB IAB Ectopic Multiple Live Births  0 0 0 0      HEALTH MAINTENANCE: Last 2 paps:  02/21/18 neg: HR HPV neg, 07/02/15 neg History of abnormal Pap or positive HPV:  no Mammogram:   02/28/23 Breast density Cat B, BI-RADS CAT 1 neg Colonoscopy:  12/09/17 - [facility]. Bone Density:  n/a  Result  n/a   Immunization History  Administered Date(s) Administered   Influenza Inj Mdck Quad Pf 11/18/2017   Influenza,inj,Quad PF,6+ Mos 11/29/2016, 02/02/2019, 09/27/2019,  10/28/2020, 09/17/2021   Influenza-Unspecified 12/01/2015, 11/29/2016, 12/25/2022   Moderna Covid-19 Fall Seasonal Vaccine 8yrs & older 03/05/2022   PFIZER(Purple Top)SARS-COV-2 Vaccination 04/10/2019, 05/01/2019, 11/01/2019, 10/04/2020   Pneumococcal Conjugate-13 07/02/2015   Pneumococcal Polysaccharide-23 06/17/2016   Tdap 04/02/2021      reports that she has never smoked. She has never used smokeless tobacco. She reports current alcohol use. She reports that she does not currently use drugs after having used the following drugs: Marijuana.  Past Medical History:  Diagnosis Date   Anxiety    COVID-19 07/2018   Depression    Diabetes mellitus without complication (HCC)    Elevated cholesterol    Gallstones    GERD (gastroesophageal reflux disease)    Hematochezia 10/2017   Plan for colonoscopy   Hyperplastic colon polyp 11/2017   IBS (irritable bowel syndrome)    diarrhea predominant   Infertility, female    Obesity    Thyroid disease 2004   treated for years, stopped 2014 per new MD    Past Surgical History:  Procedure Laterality Date   CHOLECYSTECTOMY  2006   CYST EXCISION     GLOMUS TUMOR EXCISION  2005, 2012   finger and cheek    Current Outpatient Medications  Medication Sig Dispense Refill   amphetamine-dextroamphetamine (ADDERALL) 10 MG tablet 1/2 tab BID 90 tablet 0   amphetamine-dextroamphetamine (ADDERALL) 10 MG tablet Half of a tab twice daily 90 tablet  0   atorvastatin (LIPITOR) 40 MG tablet Take 1 tablet (40 mg total) by mouth daily. 90 tablet 3   Continuous Glucose Sensor (DEXCOM G7 SENSOR) MISC USE TO CHECK GLUCOSE CONTINUOUSLY, CHANGE SENSOR EVERY 10 DAYS 9 each 3   Continuous Glucose Sensor (DEXCOM G7 SENSOR) MISC Use to check glucose continuously, change sensor every 10 days 9 each 3   FARXIGA 10 MG TABS tablet TAKE 1 TABLET BY MOUTH DAILY BEFORE BREAKFAST. 90 tablet 0   fenofibrate micronized (LOFIBRA) 134 MG capsule Take 1 capsule (134 mg total) by  mouth daily. 90 capsule 3   glimepiride (AMARYL) 4 MG tablet Take 1 tablet (4 mg total) by mouth daily before breakfast. 180 tablet 3   insulin degludec (TRESIBA FLEXTOUCH) 200 UNIT/ML FlexTouch Pen Inject 20 Units into the skin daily. 18 mL 3   Insulin Pen Needle 32G X 4 MM MISC Use 1x a day 100 each 3   Multiple Vitamin (MULTIVITAMIN ADULT PO) Take by mouth.     Omega-3 Fatty Acids (FISH OIL PO) Take 3,000 mg by mouth.     Semaglutide, 1 MG/DOSE, (OZEMPIC, 1 MG/DOSE,) 4 MG/3ML SOPN Inject 1 mg into the skin once a week. 9 mL 3   venlafaxine XR (EFFEXOR XR) 75 MG 24 hr capsule Take 3 capsules (225 mg total) by mouth daily with breakfast. 270 capsule 0   Vitamin D, Ergocalciferol, (DRISDOL) 1.25 MG (50000 UNIT) CAPS capsule Take 1 capsule (50,000 Units total) by mouth every 7 (seven) days. 12 capsule 3   FLUoxetine (PROZAC) 20 MG capsule Take 1 capsule (20 mg total) by mouth daily. (Patient not taking: Reported on 05/04/2023) 90 capsule 1   No current facility-administered medications for this visit.    ALLERGIES: Tape and Levothyroxine  Family History  Problem Relation Age of Onset   Diabetes Mother    Depression Mother    Obesity Mother    COPD Father    Heart disease Father    Early death Father    Diabetes Father    Obesity Father    Diabetes Brother    Diabetes Maternal Grandmother    Hearing loss Maternal Grandmother    Dementia Maternal Grandmother    Early death Maternal Grandfather    Cancer Maternal Grandfather    Breast cancer Paternal Grandmother    Cancer Paternal Grandmother    Early death Paternal Grandmother    Heart disease Paternal Grandfather    Breast cancer Paternal Aunt    Heart disease Paternal Uncle    Heart attack Paternal Uncle    Breast cancer Paternal Aunt    Alcohol abuse Paternal Aunt    Alzheimer's disease Paternal Aunt    Alcohol abuse Paternal Aunt    Cancer Maternal Aunt    Obesity Maternal Aunt    Colon cancer Neg Hx    Esophageal cancer  Neg Hx    Rectal cancer Neg Hx    Stomach cancer Neg Hx     Review of Systems  All other systems reviewed and are negative.   PHYSICAL EXAM:  BP 128/82 (BP Location: Left Arm, Patient Position: Sitting, Cuff Size: Small)   Pulse (!) 106   Ht 5' 5.25" (1.657 m)   Wt 195 lb (88.5 kg)   LMP 04/10/2023   SpO2 97%   BMI 32.20 kg/m     General appearance: alert, cooperative and appears stated age Head: normocephalic, without obvious abnormality, atraumatic Neck: no adenopathy, supple, symmetrical, trachea midline and thyroid normal  to inspection and palpation Lungs: clear to auscultation bilaterally Breasts: normal appearance, no masses or tenderness, No nipple retraction or dimpling, No nipple discharge or bleeding, No axillary adenopathy Heart: regular rate and rhythm Abdomen: soft, non-tender; no masses, no organomegaly Extremities: extremities normal, atraumatic, no cyanosis or edema Skin: skin color, texture, turgor normal. No rashes or lesions Lymph nodes: cervical, supraclavicular, and axillary nodes normal. Neurologic: grossly normal  Pelvic: External genitalia:  generalized erythema of the vulva.               No abnormal inguinal nodes palpated.              Urethra:  normal appearing urethra with no masses, tenderness or lesions              Bartholins and Skenes: normal                 Vagina: normal appearing vagina with normal color and discharge, no lesions              Cervix: no lesions              Pap taken: Yes.   Bimanual Exam:  Uterus:  normal size, contour, position, consistency, mobility, non-tender              Adnexa: no mass, fullness, tenderness              Rectal exam: Yes.  .  Confirms.              Anus:  normal sphincter tone, no lesions  Chaperone was present for exam:   Edwin Dada, CMA  ASSESSMENT: Well woman visit with gynecologic exam. Cervical cancer screening.  Dysmenorrhea.  Irregular menses.  Vulvar yeast infection. Rectal bleeding with  menses.  Cervical polyp on prior US.  Paternal BRCA positive family member.  DM.  Elevated A1C.  PHQ-9: 12. Currently under treatment for depression and anxiety.   PLAN: Mammogram screening discussed. Self breast awareness reviewed. Pap and HRV collected:  yes Guidelines for Calcium, Vitamin D, regular exercise program including cardiovascular and weight bearing exercise. Medication refills:  NA. I recommend OTC Monistat as needed.  Good blood sugar control encouraged.  Referral for colonoscopy.  Follow up:  for pelvic US and for yearly annual exam.   Treatment options to follow after evaluation of bleeding and pain are complete. Brochure for counseling options at [facility].

## 2023-04-29 ENCOUNTER — Other Ambulatory Visit (HOSPITAL_COMMUNITY): Payer: Self-pay

## 2023-04-29 ENCOUNTER — Other Ambulatory Visit: Payer: Self-pay | Admitting: Internal Medicine

## 2023-04-29 ENCOUNTER — Encounter: Payer: Self-pay | Admitting: Internal Medicine

## 2023-04-29 ENCOUNTER — Ambulatory Visit: Payer: BC Managed Care – PPO | Admitting: Internal Medicine

## 2023-04-29 ENCOUNTER — Telehealth: Payer: Self-pay

## 2023-04-29 VITALS — BP 120/78 | HR 99 | Ht 66.0 in | Wt 194.8 lb

## 2023-04-29 DIAGNOSIS — E782 Mixed hyperlipidemia: Secondary | ICD-10-CM | POA: Diagnosis not present

## 2023-04-29 DIAGNOSIS — E113293 Type 2 diabetes mellitus with mild nonproliferative diabetic retinopathy without macular edema, bilateral: Secondary | ICD-10-CM

## 2023-04-29 DIAGNOSIS — Z794 Long term (current) use of insulin: Secondary | ICD-10-CM | POA: Diagnosis not present

## 2023-04-29 DIAGNOSIS — E039 Hypothyroidism, unspecified: Secondary | ICD-10-CM | POA: Diagnosis not present

## 2023-04-29 LAB — POCT GLYCOSYLATED HEMOGLOBIN (HGB A1C): Hemoglobin A1C: 13.6 % — AB (ref 4.0–5.6)

## 2023-04-29 LAB — HEMOGLOBIN A1C: Hemoglobin A1C: 13.6

## 2023-04-29 LAB — GLUCOSE, POCT (MANUAL RESULT ENTRY): POC Glucose: 594 mg/dL — AB (ref 70–99)

## 2023-04-29 MED ORDER — TRESIBA FLEXTOUCH 200 UNIT/ML ~~LOC~~ SOPN: 20.0000 [IU] | PEN_INJECTOR | Freq: Every day | SUBCUTANEOUS | 3 refills | Status: AC

## 2023-04-29 MED ORDER — GLIMEPIRIDE 4 MG PO TABS: 4.0000 mg | ORAL_TABLET | Freq: Every day | ORAL | 3 refills | Status: AC

## 2023-04-29 MED ORDER — INSULIN PEN NEEDLE 32G X 4 MM MISC: 3 refills | Status: AC

## 2023-04-29 MED ORDER — FREESTYLE LIBRE 3 PLUS SENSOR MISC: 1.0000 | 3 refills | Status: DC

## 2023-04-29 MED ORDER — OZEMPIC (1 MG/DOSE) 4 MG/3ML ~~LOC~~ SOPN: 1.0000 mg | PEN_INJECTOR | SUBCUTANEOUS | 3 refills | Status: AC

## 2023-04-29 NOTE — Telephone Encounter (Signed)
Pt needs a PA for [employer]

## 2023-04-29 NOTE — Patient Instructions (Addendum)
 Please restart: - Glimepiride 4 mg 2x a day before b'fast and dinner - Ozempic 1 mg weekly in a.m. (for 2 doses, only dial up to 32 clicks)  Start: - Tresiba 20 units daily and increase by 4 units every 2-3 days until sugars in am <130, or you reach 50 units  Start the CGM. The code: [id number].  Please return in 2 months with your sugar log.

## 2023-04-29 NOTE — Progress Notes (Signed)
 Patient ID: Melissa Gray, female   DOB: [birth year], 48 y.o.   MRN: [id number]   HPI: Melissa Gray is a 48 y.o.-year-old female, initially referred by her PCP, Dr. Claiborne Billings, returning for f/u for DM2, dx in 2016, non-insulin-dependent, uncontrolled, with complications (DR). Last OV 2 years and 3 months ago. She is here with her husband.  Interim hx: No nausea, chest pain, blurry vision, no increased urination. She reports increased depression and anxiety lately.  She works from home and she has problems leaving the house.  DM2: Reviewed HbA1c: Lab Results  Component Value Date   HGBA1C 13.8 (H) 02/09/2023   HGBA1C 9.5 (H) 03/05/2022   HGBA1C 8.8 (A) 02/03/2021   HGBA1C 7.7 (A) 09/18/2020   HGBA1C 6.6 (A) 06/04/2020   HGBA1C 9.1 (A) 03/12/2020   HGBA1C 9.1 03/12/2020   HGBA1C 9.1 (A) 03/12/2020   HGBA1C 9.1 (A) 03/12/2020   HGBA1C 8.7 (A) 12/12/2019   HGBA1C 8.7 12/12/2019   HGBA1C 8.7 (A) 12/12/2019   HGBA1C 8.7 (A) 12/12/2019   She was on the following regimen at last visit, now off all of the medicines: - Glimepiride 8 mg in a.m. before breakfast  - Ozempic 0.5 >> 1 mg weekly  - Farxiga 10 mg in the am before b'fast Tried Victoza >> pain at injection site - several years ago. She was previously on Rybelsus, but was not taking it consistently. We stopped Januvia when we switched to [facility] 03/2020. Please stop colesevelam twice a day due to increased triglycerides.    Pt was checking sugars 4x a day with her CGM [facility]) -off for last 3 years - not checking.  Previously:   Previously:   Lowest sugar was 70s >> >80 >> ?; she has hypoglycemia awareness at 70.  Highest sugar was 300 >> 260 (milkshakes) >> >350 >> ?.  Pt's meals are: (now [employer]) - Breakfast: 2 eggs with veggies or bagel w/ cream cheese or cereal, or skips - Lunch: sandwich - Dinner: takeout 1-2x a week, homecooked: meat + veggies or salad + starch - Snacks: 1-2: icecream - maybe  once a week (decreased cravings with Adderrall) She previously saw nutrition.  - no CKD, last BUN/creatinine:  Lab Results  Component Value Date   BUN 13 02/09/2023   BUN 12 03/05/2022   CREATININE 0.74 02/09/2023   CREATININE 0.75 03/05/2022   Lab Results  Component Value Date   MICRALBCREAT 3.3 02/09/2023   MICRALBCREAT 4.0 03/05/2022   MICRALBCREAT 1.2 04/02/2021   MICRALBCREAT 0.9 10/28/2020   MICRALBCREAT 1.6 05/30/2019   MICRALBCREAT 0.9 03/24/2018   MICRALBCREAT 0.6 03/15/2017   MICRALBCREAT 0.8 08/21/2015  She is not on ARB or ACE inhibitor.  -+ HL; last set of lipids: Lab Results  Component Value Date   CHOL 164 02/09/2023   HDL 27.80 (L) 02/09/2023   LDLCALC 53 10/28/2020   LDLDIRECT 60.0 02/09/2023   TRIG (H) 02/09/2023    610.0 Triglyceride is over 400; calculations on Lipids are invalid.   CHOLHDL 6 02/09/2023  On Lipitor 40 mg daily, micronized fenofibrate 134 mg daily, fish oil.  We stopped colesevelam 05/2020.  - last eye exam was on 03/10/2023: + DR   - no numbness and tingling in her feet.  Last foot exam 02/09/2023.  Pt has FH of DM in mother, father, maternal grandmother, brother.  She also has a history of hypothyroidism:  Patient describes that she was previously on Synthroid for several years.  She could not tolerate  generic levothyroxine due to facial rash and tingling.  However, her levothyroxine requirements decreased and she was able to come off.  She was off the hormone in 11/2019 when TSH was slightly high.  Repeat TSH 3 months later was still slightly high.  At that time, she was started on liothyronine by PCP (10 mcg).  We stopped this in 02/2020.  She did not feel differently afterwards.   TFTs remained normal - until 01/2023: Lab Results  Component Value Date   TSH 5.54 (H) 02/09/2023   TSH 3.32 03/05/2022   TSH 3.63 04/02/2021   TSH 2.79 10/28/2020   TSH 3.04 06/04/2020   TSH 4.57 (H) 03/12/2020   TSH 4.95 (H) 12/12/2019   TSH 2.25  02/02/2019   TSH 3.64 06/30/2018   TSH 2.79 08/31/2017   No results found for: "THGAB"   Pt denies: - feeling nodules in neck - hoarseness - dysphagia - choking  She also has a history of GERD, IBS, obesity, gallstones - status post cholecystectomy, depression.  ROS: + see HPI  Past Medical History:  Diagnosis Date   Anxiety    COVID-19 07/2018   Depression    Diabetes mellitus without complication (HCC)    Elevated cholesterol    Gallstones    GERD (gastroesophageal reflux disease)    Hematochezia 10/2017   Plan for colonoscopy   Hyperplastic colon polyp 11/2017   IBS (irritable bowel syndrome)    diarrhea predominant   Infertility, female    Obesity    Thyroid disease 2004   treated for years, stopped 2014 per new MD   Past Surgical History:  Procedure Laterality Date   CHOLECYSTECTOMY  2006   CYST EXCISION     GLOMUS TUMOR EXCISION  2005, 2012   finger and cheek   Social History   Socioeconomic History   Marital status: Married    Spouse name: Not on file   Number of children: 0   Years of education: Not on file   Highest education level: Master's degree (e.g., MA, MS, MEng, MEd, MSW, MBA)  Occupational History   Occupation: Runner, broadcasting/film/video  Tobacco Use   Smoking status: Never   Smokeless tobacco: Never   Tobacco comments:    My dad smoked like a chimney when I was growing up  Vaping Use   Vaping status: Never Used  Substance and Sexual Activity   Alcohol use: Yes    Alcohol/week: 0.0 standard drinks of alcohol    Comment: Maybe once a month or less; only socially   Drug use: Not Currently    Types: Marijuana   Sexual activity: Yes    Partners: Male    Birth control/protection: None  Other Topics Concern   Not on file  Social History Narrative   Married. Husband's name is Vincenza Hews. No children.   2 pets.   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   Drinks caffeine   Wears her seatbelt   Smoke detector in home   Safe  in her relationship.   Social Drivers of Corporate investment banker Strain: Low Risk  (07/07/2021)   Overall Financial Resource Strain (CARDIA)    Difficulty of Paying Living Expenses: Not very hard  Food Insecurity: No Food Insecurity (07/07/2021)   Hunger Vital Sign    Worried About Running Out of Food in the Last Year: Never true    Ran Out of Food in the Last Year: Never true  Transportation Needs: No Transportation Needs (07/07/2021)  PRAPARE - Administrator, Civil Service (Medical): No    Lack of Transportation (Non-Medical): No  Physical Activity: Unknown (07/07/2021)   Exercise Vital Sign    Days of Exercise per Week: Patient declined    Minutes of Exercise per Session: Not on file  Stress: Stress Concern Present (07/07/2021)   [employer] of Occupational Health - Occupational Stress Questionnaire    Feeling of Stress : To some extent  Social Connections: Moderately Isolated (07/07/2021)   Social Connection and Isolation Panel [NHANES]    Frequency of Communication with Friends and Family: Once a week    Frequency of Social Gatherings with Friends and Family: Once a week    Attends Religious Services: Never    Database administrator or Organizations: Yes    Attends Engineer, structural: More than 4 times per year    Marital Status: Married  Catering manager Violence: Not on file   Current Outpatient Medications on File Prior to Visit  Medication Sig Dispense Refill   amphetamine-dextroamphetamine (ADDERALL) 10 MG tablet 1/2 tab BID 90 tablet 0   amphetamine-dextroamphetamine (ADDERALL) 10 MG tablet Half of a tab twice daily 90 tablet 0   atorvastatin (LIPITOR) 40 MG tablet Take 1 tablet (40 mg total) by mouth daily. 90 tablet 3   Continuous Blood Gluc Sensor (FREESTYLE LIBRE 3 SENSOR) MISC 1 each by Does not apply route every 14 (fourteen) days. 6 each 3   FARXIGA 10 MG TABS tablet TAKE 1 TABLET BY MOUTH DAILY BEFORE BREAKFAST. 90 tablet 0    fenofibrate micronized (LOFIBRA) 134 MG capsule Take 1 capsule (134 mg total) by mouth daily. 90 capsule 3   FLUoxetine (PROZAC) 20 MG capsule Take 1 capsule (20 mg total) by mouth daily. 90 capsule 1   glimepiride (AMARYL) 4 MG tablet Take 2 tablets (8 mg total) by mouth every morning. (Patient not taking: Reported on 02/09/2023) 180 tablet 3   Multiple Vitamin (MULTIVITAMIN ADULT PO) Take by mouth.     Omega-3 Fatty Acids (FISH OIL PO) Take 3,000 mg by mouth.     Semaglutide, 1 MG/DOSE, (OZEMPIC, 1 MG/DOSE,) 4 MG/3ML SOPN Inject 1 mg into the skin once a week. 9 mL 3   venlafaxine XR (EFFEXOR XR) 75 MG 24 hr capsule Take 3 capsules (225 mg total) by mouth daily with breakfast. 270 capsule 0   Vitamin D, Ergocalciferol, (DRISDOL) 1.25 MG (50000 UNIT) CAPS capsule Take 1 capsule (50,000 Units total) by mouth every 7 (seven) days. 12 capsule 3   No current facility-administered medications on file prior to visit.   Allergies  Allergen Reactions   Tape Other (See Comments)    Adhesive tape causes blisters   Levothyroxine Rash    Generic brand only caused rash   Family History  Problem Relation Age of Onset   Diabetes Mother    Depression Mother    Obesity Mother    COPD Father    Heart disease Father    Early death Father    Diabetes Father    Obesity Father    Diabetes Brother    Diabetes Maternal Grandmother    Hearing loss Maternal Grandmother    Dementia Maternal Grandmother    Early death Maternal Grandfather    Cancer Maternal Grandfather    Breast cancer Paternal Grandmother    Cancer Paternal Grandmother    Early death Paternal Grandmother    Heart disease Paternal Grandfather    Breast cancer Paternal Aunt  Heart disease Paternal Uncle    Heart attack Paternal Uncle    Breast cancer Paternal Aunt    Alcohol abuse Paternal Aunt    Alzheimer's disease Paternal Aunt    Alcohol abuse Paternal Aunt    Cancer Maternal Aunt    Obesity Maternal Aunt    Colon cancer Neg  Hx    Esophageal cancer Neg Hx    Rectal cancer Neg Hx    Stomach cancer Neg Hx     PE: BP 120/78   Pulse 99   Ht 5\' 6"  (1.676 m)   Wt 194 lb 12.8 oz (88.4 kg)   SpO2 93%   BMI 31.44 kg/m  Wt Readings from Last 3 Encounters:  04/29/23 194 lb 12.8 oz (88.4 kg)  02/09/23 194 lb 3.2 oz (88.1 kg)  08/20/22 204 lb 9.6 oz (92.8 kg)   Constitutional: overweight, in NAD Eyes: EOMI, no exophthalmos ENT: moist mucous membranes, no thyromegaly, no cervical lymphadenopathy Cardiovascular: RRR, No MRG Musculoskeletal: no deformities, Skin:  no rashes Neurological: no tremor with outstretched hands  ASSESSMENT: 1. 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  2.  Hypothyroidism  3. HL  PLAN:  1. Patient longstanding uncontrolled, type 2 diabetes, returning after long absence of more than 2 years.  At last visit, HbA1c was higher, at 8.8%. An HbA1c obtained 3 months ago which was much higher, at 13.8%! At last visit, she mentioned that she missed Ozempic for 1 month and just restarted it before the visit.  She could not afford the CGM after the time of the visit but was planning to restart it.  We did not change the regimen at that time. - CBG 594 in the office today.  She is not on any diabetic medications as she did not have any more refills.  She is also not checking blood sugars. -At today's visit we discussed about starting to check blood sugars.  She is interested in restarting the CGM and a prescription was sent to the pharmacy.  We also discussed the need for insulin.  Will try to start [place] and I advised her to increase as needed.  Will try to restart glimepiride and the GLP-1 receptor agonist.  I am reticent to start Farxiga for now as I would not want to cause more dehydration.  However, we need to start this when sugars start improving. -We discussed that we can  do the appointments virtually, if needed.  I gave her the clinic codes to connect her CGM to our clinic. - I suggested to:  Patient Instructions  Please restart: - Glimepiride 4 mg 2x a day before b'fast and dinner - Ozempic 1 mg weekly in a.m. (for 2 doses, only dial up to 32 clicks)  Start: - Tresiba 20 units daily and increase by 4 units every 2-3 days until sugars in am <130, or you reach 50 units  Start the CGM. The code: [id number].  Please return in 2 months with your sugar log.   - we checked her HbA1c: 13.6% (still very high) - advised to check sugars at different times of the day - 4x a day, rotating check times - advised for yearly eye exams >> she is UTD - return to clinic in 2 months   2.  Hypothyroidism -Previously on Synthroid d.a.w. for several years, after which she was able to come up and TFTs normalized. She was  then started on liothyronine 10 mcg daily after her TSH returned slightly high x2 in 02/2020: 4.95 and 4.57, respectively.  She did not feel difference after starting liothyronine and after discussions about possible side effects of such regimen, we tapered the liothyronine down.  TFTs remained normal afterwards. -However she had a slightly high TSH 3 months ago: Lab Results  Component Value Date   TSH 5.54 (H) 02/09/2023  -We will repeat her TFTs in 2 months  3. HL - Latest lipid panel reviewed from 01/2023: Triglycerides quite high, HDL low, LDL At goal: Lab Results  Component Value Date   CHOL 164 02/09/2023   HDL 27.80 (L) 02/09/2023   LDLCALC 53 10/28/2020   LDLDIRECT 60.0 02/09/2023   TRIG (H) 02/09/2023    610.0 Triglyceride is over 400; calculations on Lipids are invalid.   CHOLHDL 6 02/09/2023  - On Lipitor 40 mg daily and fenofibrate 134 mg daily along with fish oil-no side effects.  We stopped colesevelam in the past due to high triglycerides. - Will need to recheck her lipid panel at next visit.  Starting insulin should greatly help with  the triglyceride levels.  Carlus Pavlov, MD PhD [facility] Endocrinology

## 2023-04-29 NOTE — Telephone Encounter (Signed)
 Pharmacy Patient Advocate Encounter   Received notification from Pt Calls Messages that prior authorization for Freestyle libre 3 plus is required/requested.   Insurance verification completed.   The patient is insured through [facility] .   Per test claim:  Dexcom is preferred by the insurance.  If suggested medication is appropriate, Please send in a new RX and discontinue this one. If not, please advise as to why it's not appropriate so that we may request a Prior Authorization. Please note, some preferred medications may still require a PA.  If the suggested medications have not been trialed and there are no contraindications to their use, the PA will not be submitted, as it will not be approved.

## 2023-05-02 ENCOUNTER — Other Ambulatory Visit: Payer: Self-pay | Admitting: Internal Medicine

## 2023-05-02 MED ORDER — DEXCOM G7 SENSOR MISC: 3 refills | Status: DC

## 2023-05-02 NOTE — Addendum Note (Signed)
 Addended by: Pollie Meyer on: 05/02/2023 10:55 AM   Modules accepted: Orders

## 2023-05-02 NOTE — Telephone Encounter (Signed)
 Requested Prescriptions   Signed Prescriptions Disp Refills   Continuous Glucose Sensor (DEXCOM G7 SENSOR) MISC 9 each 3    Sig: Use to check glucose continuously, change sensor every 10 days    Authorizing Provider: Carlus Pavlov    Ordering User: Pollie Meyer

## 2023-05-03 ENCOUNTER — Other Ambulatory Visit (HOSPITAL_COMMUNITY): Payer: Self-pay

## 2023-05-03 ENCOUNTER — Other Ambulatory Visit: Payer: Self-pay | Admitting: Family Medicine

## 2023-05-03 ENCOUNTER — Telehealth: Payer: Self-pay

## 2023-05-03 NOTE — Telephone Encounter (Signed)
 Pharmacy Patient Advocate Encounter   Received notification from Pt Calls Messages that prior authorization for Dexcom G7 sensor is required/requested.   Insurance verification completed.   The patient is insured through [facility] .   Per test claim: PA required; PA submitted to above mentioned insurance via CoverMyMeds Key/confirmation #/EOC [id number] Status is pending

## 2023-05-03 NOTE — Telephone Encounter (Signed)
Pt needs a PA for Dexcom

## 2023-05-04 ENCOUNTER — Ambulatory Visit: Payer: BC Managed Care – PPO | Admitting: Obstetrics and Gynecology

## 2023-05-04 ENCOUNTER — Encounter: Payer: Self-pay | Admitting: Obstetrics and Gynecology

## 2023-05-04 ENCOUNTER — Other Ambulatory Visit (HOSPITAL_COMMUNITY)
Admission: RE | Admit: 2023-05-04 | Discharge: 2023-05-04 | Disposition: A | Discharge status: Home / Self Care | Source: Ambulatory Visit | Attending: Obstetrics and Gynecology | Admitting: Obstetrics and Gynecology

## 2023-05-04 VITALS — BP 128/82 | HR 106 | Ht 65.25 in | Wt 195.0 lb

## 2023-05-04 DIAGNOSIS — N946 Dysmenorrhea, unspecified: Secondary | ICD-10-CM | POA: Diagnosis not present

## 2023-05-04 DIAGNOSIS — N926 Irregular menstruation, unspecified: Secondary | ICD-10-CM

## 2023-05-04 DIAGNOSIS — Z1331 Encounter for screening for depression: Secondary | ICD-10-CM

## 2023-05-04 DIAGNOSIS — Z124 Encounter for screening for malignant neoplasm of cervix: Secondary | ICD-10-CM | POA: Diagnosis not present

## 2023-05-04 DIAGNOSIS — Z01419 Encounter for gynecological examination (general) (routine) without abnormal findings: Secondary | ICD-10-CM

## 2023-05-04 DIAGNOSIS — K625 Hemorrhage of anus and rectum: Secondary | ICD-10-CM

## 2023-05-04 MED ORDER — DEXCOM G7 SENSOR MISC: 3 refills | Status: AC

## 2023-05-04 NOTE — Addendum Note (Signed)
 Addended by: Pollie Meyer on: 05/04/2023 08:23 AM   Modules accepted: Orders

## 2023-05-04 NOTE — Patient Instructions (Signed)

## 2023-05-04 NOTE — Telephone Encounter (Signed)
 Outcome Approved on April 8 by [facility] 2017 Approved. Effective Date: 05/03/2023 Authorization Expiration Date: 05/02/2024

## 2023-05-06 LAB — CYTOLOGY - PAP
Comment: NEGATIVE
Diagnosis: NEGATIVE
High risk HPV: NEGATIVE

## 2023-05-09 NOTE — Telephone Encounter (Signed)
 Pharmacy Patient Advocate Encounter  Received notification from [facility] that Prior Authorization for Dexcom G7 sensor has been APPROVED through 05/02/24   PA #/Case ID/Reference #: [id number]

## 2023-05-10 ENCOUNTER — Encounter: Payer: Self-pay | Admitting: Obstetrics and Gynecology

## 2023-05-26 ENCOUNTER — Other Ambulatory Visit

## 2023-05-26 ENCOUNTER — Other Ambulatory Visit: Admitting: Obstetrics and Gynecology

## 2023-06-16 ENCOUNTER — Other Ambulatory Visit: Payer: Self-pay | Admitting: Obstetrics and Gynecology

## 2023-06-16 ENCOUNTER — Ambulatory Visit (INDEPENDENT_AMBULATORY_CARE_PROVIDER_SITE_OTHER): Admitting: Obstetrics and Gynecology

## 2023-06-16 ENCOUNTER — Encounter: Payer: Self-pay | Admitting: Obstetrics and Gynecology

## 2023-06-16 ENCOUNTER — Telehealth: Payer: Self-pay | Admitting: Obstetrics and Gynecology

## 2023-06-16 ENCOUNTER — Ambulatory Visit

## 2023-06-16 VITALS — BP 122/84 | HR 105

## 2023-06-16 DIAGNOSIS — K625 Hemorrhage of anus and rectum: Secondary | ICD-10-CM | POA: Diagnosis not present

## 2023-06-16 DIAGNOSIS — N946 Dysmenorrhea, unspecified: Secondary | ICD-10-CM

## 2023-06-16 DIAGNOSIS — N926 Irregular menstruation, unspecified: Secondary | ICD-10-CM | POA: Diagnosis not present

## 2023-06-16 DIAGNOSIS — Z124 Encounter for screening for malignant neoplasm of cervix: Secondary | ICD-10-CM

## 2023-06-16 DIAGNOSIS — Z01419 Encounter for gynecological examination (general) (routine) without abnormal findings: Secondary | ICD-10-CM

## 2023-06-16 NOTE — Telephone Encounter (Signed)
 Please refer to Dr. Candyce Champagne, general surgery.   My patient has significant rectal bleeding with menstruation and has had a colonoscopy that did not detect findings.   She has dysmenorrhea.

## 2023-06-16 NOTE — Telephone Encounter (Signed)
 Please also confirm with patient if she has had genetic counseling done.  Family history of BRCA.

## 2023-06-16 NOTE — Telephone Encounter (Signed)
 Referral placed. Msg sent to [facility] referrals.

## 2023-06-16 NOTE — Progress Notes (Signed)
 GYNECOLOGY  VISIT   HPI: 48 y.o.   Married  Caucasian female   G0P0000 with Patient's last menstrual period was 06/07/2023 (approximate).   here for: u/s consult      Has rectal bleeding with her period, and she is really worried.  She had a colonoscopy showing nonbleeding hemorrhoids.   GYNECOLOGIC HISTORY: Patient's last menstrual period was 06/07/2023 (approximate). Contraception:  none Menopausal hormone therapy:  n/a Last 2 paps:  05/04/23 neg HR HPV neg, 1/28/20neg HPV neg  History of abnormal Pap or positive HPV:  no Mammogram:  02/28/23 Breast Density Cat B, BIRADS Cat 1 neg         OB History     Gravida  0   Para  0   Term  0   Preterm  0   AB  0   Living  0      SAB  0   IAB  0   Ectopic  0   Multiple  0   Live Births                 Patient Active Problem List   Diagnosis Date Noted   Acquired hypothyroidism 09/18/2020   Attention and concentration deficit 09/27/2019   Insomnia due to other mental disorder 06/14/2018   Depression with anxiety 05/28/2016   Type 2 diabetes mellitus with hyperlipidemia (HCC) 01/29/2016   FH: BRCA gene positive 07/02/2015   Morbid obesity (HCC) 06/03/2015    Past Medical History:  Diagnosis Date   Anxiety    COVID-19 07/2018   Depression    Diabetes mellitus without complication (HCC)    Elevated cholesterol    Gallstones    GERD (gastroesophageal reflux disease)    Hematochezia 10/2017   Plan for colonoscopy   Hyperplastic colon polyp 11/2017   IBS (irritable bowel syndrome)    diarrhea predominant   Infertility, female    Obesity    Thyroid  disease 2004   treated for years, stopped 2014 per new MD    Past Surgical History:  Procedure Laterality Date   CHOLECYSTECTOMY  2006   CYST EXCISION     GLOMUS TUMOR EXCISION  2005, 2012   finger and cheek    Current Outpatient Medications  Medication Sig Dispense Refill   amphetamine -dextroamphetamine  (ADDERALL) 10 MG tablet 1/2 tab BID 90  tablet 0   amphetamine -dextroamphetamine  (ADDERALL) 10 MG tablet Half of a tab twice daily 90 tablet 0   atorvastatin  (LIPITOR) 40 MG tablet Take 1 tablet (40 mg total) by mouth daily. 90 tablet 3   Continuous Glucose Sensor (DEXCOM G7 SENSOR) MISC USE TO CHECK GLUCOSE CONTINUOUSLY, CHANGE SENSOR EVERY 10 DAYS 9 each 3   Continuous Glucose Sensor (DEXCOM G7 SENSOR) MISC Use to check glucose continuously, change sensor every 10 days 9 each 3   FARXIGA  10 MG TABS tablet TAKE 1 TABLET BY MOUTH DAILY BEFORE BREAKFAST. 90 tablet 0   fenofibrate  micronized (LOFIBRA) 134 MG capsule Take 1 capsule (134 mg total) by mouth daily. 90 capsule 3   glimepiride  (AMARYL ) 4 MG tablet Take 1 tablet (4 mg total) by mouth daily before breakfast. 180 tablet 3   insulin  degludec (TRESIBA  FLEXTOUCH) 200 UNIT/ML FlexTouch Pen Inject 20 Units into the skin daily. 18 mL 3   Insulin  Pen Needle 32G X 4 MM MISC Use 1x a day 100 each 3   Multiple Vitamin (MULTIVITAMIN ADULT PO) Take by mouth.     Omega-3 Fatty Acids (FISH OIL  PO) Take 3,000 mg by mouth.     Semaglutide , 1 MG/DOSE, (OZEMPIC , 1 MG/DOSE,) 4 MG/3ML SOPN Inject 1 mg into the skin once a week. 9 mL 3   venlafaxine  XR (EFFEXOR  XR) 75 MG 24 hr capsule Take 3 capsules (225 mg total) by mouth daily with breakfast. 270 capsule 0   Vitamin D , Ergocalciferol , (DRISDOL ) 1.25 MG (50000 UNIT) CAPS capsule Take 1 capsule (50,000 Units total) by mouth every 7 (seven) days. 12 capsule 3   FLUoxetine  (PROZAC ) 20 MG capsule Take 1 capsule (20 mg total) by mouth daily. (Patient not taking: Reported on 06/16/2023) 90 capsule 1   No current facility-administered medications for this visit.     ALLERGIES: Tape and Levothyroxine  Family History  Problem Relation Age of Onset   Diabetes Mother    Depression Mother    Obesity Mother    COPD Father    Heart disease Father    Early death Father    Diabetes Father    Obesity Father    Diabetes Brother    Diabetes Maternal  Grandmother    Hearing loss Maternal Grandmother    Dementia Maternal Grandmother    Early death Maternal Grandfather    Cancer Maternal Grandfather    Breast cancer Paternal Grandmother    Cancer Paternal Grandmother    Early death Paternal Grandmother    Heart disease Paternal Grandfather    Breast cancer Paternal Aunt    Heart disease Paternal Uncle    Heart attack Paternal Uncle    Breast cancer Paternal Aunt    Alcohol abuse Paternal Aunt    Alzheimer's disease Paternal Aunt    Alcohol abuse Paternal Aunt    Cancer Maternal Aunt    Obesity Maternal Aunt    Colon cancer Neg Hx    Esophageal cancer Neg Hx    Rectal cancer Neg Hx    Stomach cancer Neg Hx     Social History   Socioeconomic History   Marital status: Married    Spouse name: Not on file   Number of children: 0   Years of education: Not on file   Highest education level: Master's degree (e.g., MA, MS, MEng, MEd, MSW, MBA)  Occupational History   Occupation: Runner, broadcasting/film/video  Tobacco Use   Smoking status: Never   Smokeless tobacco: Never   Tobacco comments:    My dad smoked like a chimney when I was growing up  Advertising account planner   Vaping status: Never Used  Substance and Sexual Activity   Alcohol use: Yes    Alcohol/week: 0.0 standard drinks of alcohol    Comment: Maybe once a month or less; only socially   Drug use: Not Currently    Types: Marijuana   Sexual activity: Yes    Partners: Male    Birth control/protection: None  Other Topics Concern   Not on file  Social History Narrative   Married. Husband's name is Jimmye Moulds. No children.   2 pets.   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   Drinks caffeine   Wears her seatbelt   Smoke detector in home   Safe in her relationship.   Social Drivers of Corporate investment banker Strain: Low Risk  (07/07/2021)   Overall Financial Resource Strain (CARDIA)    Difficulty of Paying Living Expenses: Not very hard  Food Insecurity: No Food  Insecurity (07/07/2021)   Hunger Vital Sign    Worried About Running Out of Food in the Last  Year: Never true    Ran Out of Food in the Last Year: Never true  Transportation Needs: No Transportation Needs (07/07/2021)   PRAPARE - Administrator, Civil Service (Medical): No    Lack of Transportation (Non-Medical): No  Physical Activity: Unknown (07/07/2021)   Exercise Vital Sign    Days of Exercise per Week: Patient declined    Minutes of Exercise per Session: Not on file  Stress: Stress Concern Present (07/07/2021)   [employer] of Occupational Health - Occupational Stress Questionnaire    Feeling of Stress : To some extent  Social Connections: Moderately Isolated (07/07/2021)   Social Connection and Isolation Panel [NHANES]    Frequency of Communication with Friends and Family: Once a week    Frequency of Social Gatherings with Friends and Family: Once a week    Attends Religious Services: Never    Database administrator or Organizations: Yes    Attends Engineer, structural: More than 4 times per year    Marital Status: Married  Catering manager Violence: Not on file    Review of Systems  All other systems reviewed and are negative.   PHYSICAL EXAMINATION:   BP 122/84 (BP Location: Left Arm, Patient Position: Sitting)   Pulse (!) 105   LMP 06/07/2023 (Approximate)   SpO2 97%     General appearance: alert, cooperative and appears stated age   Pelvic US   Uterus 8.66 x 4.27 x 3.63 mm.  No myometrial masses. EMS 6.87 mm.  Cervix with 9.1 x 5.3 mm polypoid area.  Left ovary 2.43 x 1.64 x 1.22 cm.  Normal.  Right ovary 2.33 x 1.23 x 1,63 cm.  Normal.  No adnexal masses or free fluid.   ASSESSMENT:  Dysmenorrhea.  No signs of endometriosis on US , but limits of imaging to detect this was discussed. Rectal bleeding with menses. FH BRCA.   Has been referred for genetic counseling/testing.   PLAN:  US  reviewed.  Referral to Dr. Candyce Champagne of general  surgery.  20 min  total time was spent for this patient encounter, including preparation, face-to-face counseling with the patient, coordination of care, and documentation of the encounter.

## 2023-06-24 NOTE — Telephone Encounter (Signed)
"  Lucendia Rusk, CMA Appt. 6/9 @ 8:45 w/ Dr. Hershell Lose.  Thanks.  Cornelius Dill"

## 2023-06-27 NOTE — Telephone Encounter (Signed)
 Call placed to patient, left detailed message, ok per dpr. Please return call to advise if any Hx of genetic counseling for Family Hx of BRCA. [phone], opt 4.

## 2023-06-29 NOTE — Telephone Encounter (Signed)
 Called home #, no answer and unable to LVM.

## 2023-07-04 DIAGNOSIS — K641 Second degree hemorrhoids: Secondary | ICD-10-CM | POA: Diagnosis not present

## 2023-07-04 DIAGNOSIS — K625 Hemorrhage of anus and rectum: Secondary | ICD-10-CM | POA: Insufficient documentation

## 2023-07-04 DIAGNOSIS — K582 Mixed irritable bowel syndrome: Secondary | ICD-10-CM | POA: Diagnosis not present

## 2023-07-04 DIAGNOSIS — Z860102 Personal history of hyperplastic colon polyps: Secondary | ICD-10-CM | POA: Insufficient documentation

## 2023-07-04 DIAGNOSIS — K644 Residual hemorrhoidal skin tags: Secondary | ICD-10-CM | POA: Diagnosis not present

## 2023-07-04 DIAGNOSIS — N920 Excessive and frequent menstruation with regular cycle: Secondary | ICD-10-CM | POA: Insufficient documentation

## 2023-07-05 ENCOUNTER — Telehealth: Payer: Self-pay

## 2023-07-05 NOTE — Telephone Encounter (Signed)
 FW: Referral on 07/05/2023 Received: Today Cirigliano, Vito V, DO  Diantha Fossa, CMA; Candyce Champagne, MD No problem, we can get that done.  Melissa Gray, can you please schedule OV with me with plan to set up for colonoscopy.  Thanks.   Left message for patient to return call to schedule new patient appointment with Dr Karene Oto.  Will continue efforts.

## 2023-07-07 ENCOUNTER — Encounter: Payer: Self-pay | Admitting: Obstetrics and Gynecology

## 2023-07-07 NOTE — Telephone Encounter (Signed)
 Left message for patient to return call to schedule appointment with Dr Karene Oto.  Will continue efforts.

## 2023-07-08 NOTE — Telephone Encounter (Signed)
 Left message for patient to return call to schedule appointment with Dr Karene Oto.  Will continue efforts.

## 2023-07-11 ENCOUNTER — Encounter: Payer: Self-pay | Admitting: Family Medicine

## 2023-07-12 ENCOUNTER — Other Ambulatory Visit: Payer: Self-pay | Admitting: Family Medicine

## 2023-07-12 ENCOUNTER — Other Ambulatory Visit: Payer: Self-pay

## 2023-07-12 MED ORDER — VENLAFAXINE HCL ER 75 MG PO CP24: 225.0000 mg | ORAL_CAPSULE | Freq: Every day | ORAL | 0 refills | Status: DC

## 2023-07-13 NOTE — Telephone Encounter (Signed)
 Spoke with patient and she is aware that she is scheduled for new patient consult with Dr Karene Oto on 09-21-23 at 8:40.  Patient agreed to plan and verbalized understanding.  No further questions or concerns.

## 2023-07-19 NOTE — Telephone Encounter (Signed)
 Encounter reviewed and closed.

## 2023-07-19 NOTE — Telephone Encounter (Signed)
 Per review of EPIC, patient was seen by Dr. Sheldon at [facility] on 07/04/23.   Patient has not returned call to advise on genetic counseling.

## 2023-08-02 ENCOUNTER — Other Ambulatory Visit: Payer: Self-pay | Admitting: Family Medicine

## 2023-08-04 ENCOUNTER — Other Ambulatory Visit: Payer: Self-pay | Admitting: Family Medicine

## 2023-08-08 ENCOUNTER — Ambulatory Visit (INDEPENDENT_AMBULATORY_CARE_PROVIDER_SITE_OTHER): Payer: BC Managed Care – PPO | Admitting: Family Medicine

## 2023-08-08 DIAGNOSIS — F418 Other specified anxiety disorders: Secondary | ICD-10-CM

## 2023-08-08 DIAGNOSIS — E039 Hypothyroidism, unspecified: Secondary | ICD-10-CM

## 2023-08-08 DIAGNOSIS — E1169 Type 2 diabetes mellitus with other specified complication: Secondary | ICD-10-CM

## 2023-08-08 DIAGNOSIS — Z91199 Patient's noncompliance with other medical treatment and regimen due to unspecified reason: Secondary | ICD-10-CM

## 2023-08-08 DIAGNOSIS — R41840 Attention and concentration deficit: Secondary | ICD-10-CM

## 2023-08-08 DIAGNOSIS — E785 Hyperlipidemia, unspecified: Secondary | ICD-10-CM

## 2023-08-08 DIAGNOSIS — F5105 Insomnia due to other mental disorder: Secondary | ICD-10-CM

## 2023-08-08 DIAGNOSIS — E559 Vitamin D deficiency, unspecified: Secondary | ICD-10-CM

## 2023-08-08 DIAGNOSIS — F99 Mental disorder, not otherwise specified: Secondary | ICD-10-CM

## 2023-08-08 NOTE — Progress Notes (Signed)
 No show

## 2023-08-08 NOTE — Patient Instructions (Signed)

## 2023-08-09 ENCOUNTER — Ambulatory Visit: Admitting: Family Medicine

## 2023-08-09 ENCOUNTER — Encounter: Payer: Self-pay | Admitting: Family Medicine

## 2023-08-09 ENCOUNTER — Encounter: Payer: Self-pay | Admitting: Internal Medicine

## 2023-08-09 ENCOUNTER — Telehealth: Payer: Self-pay | Admitting: Family Medicine

## 2023-08-09 ENCOUNTER — Ambulatory Visit: Admitting: Internal Medicine

## 2023-08-09 VITALS — BP 128/83 | HR 94 | Temp 98.1°F | Wt 205.4 lb

## 2023-08-09 VITALS — BP 120/80 | HR 92 | Ht 65.25 in | Wt 202.6 lb

## 2023-08-09 DIAGNOSIS — E039 Hypothyroidism, unspecified: Secondary | ICD-10-CM

## 2023-08-09 DIAGNOSIS — E782 Mixed hyperlipidemia: Secondary | ICD-10-CM | POA: Diagnosis not present

## 2023-08-09 DIAGNOSIS — E785 Hyperlipidemia, unspecified: Secondary | ICD-10-CM

## 2023-08-09 DIAGNOSIS — E1169 Type 2 diabetes mellitus with other specified complication: Secondary | ICD-10-CM

## 2023-08-09 DIAGNOSIS — Z794 Long term (current) use of insulin: Secondary | ICD-10-CM | POA: Diagnosis not present

## 2023-08-09 DIAGNOSIS — R41840 Attention and concentration deficit: Secondary | ICD-10-CM

## 2023-08-09 DIAGNOSIS — E113293 Type 2 diabetes mellitus with mild nonproliferative diabetic retinopathy without macular edema, bilateral: Secondary | ICD-10-CM

## 2023-08-09 DIAGNOSIS — F418 Other specified anxiety disorders: Secondary | ICD-10-CM | POA: Diagnosis not present

## 2023-08-09 LAB — POCT GLYCOSYLATED HEMOGLOBIN (HGB A1C): Hemoglobin A1C: 13.3 % — AB (ref 4.0–5.6)

## 2023-08-09 MED ORDER — AMPHETAMINE-DEXTROAMPHETAMINE 10 MG PO TABS: ORAL_TABLET | ORAL | 0 refills | Status: AC

## 2023-08-09 MED ORDER — ARIPIPRAZOLE 2 MG PO TABS: 2.0000 mg | ORAL_TABLET | Freq: Every day | ORAL | 1 refills | Status: DC

## 2023-08-09 MED ORDER — VITAMIN D (ERGOCALCIFEROL) 1.25 MG (50000 UNIT) PO CAPS: 50000.0000 [IU] | ORAL_CAPSULE | ORAL | 3 refills | Status: AC

## 2023-08-09 MED ORDER — VENLAFAXINE HCL ER 75 MG PO CP24: 225.0000 mg | ORAL_CAPSULE | Freq: Every day | ORAL | 1 refills | Status: DC

## 2023-08-09 NOTE — Progress Notes (Unsigned)
 Patient ID: Melissa Gray, female  DOB: 11/27/1975, 48 y.o.   MRN: [id number] Patient Care Team    Relationship Specialty Notifications Start End  Catherine Charlies LABOR, DO PCP - General Family Medicine  06/03/15   [place]or GAILS, DO Consulting Physician Gastroenterology  11/16/17   Cathlyn JAYSON Nikki Bobie FORBES, MD Consulting Physician Obstetrics and Gynecology  03/28/20     Chief Complaint  Patient presents with   Depression   Anxiety    Subjective: Melissa Gray is a 48 y.o.  Female  present for chronic condition management combined appointment All past medical history, surgical history, allergies, family history, immunizations, medications and social history were updated in the electronic medical record today. All recent labs, ED visits and hospitalizations within the last year were reviewed. *** Stopped prozac  ADD/anxiety/depression: Patient reports compliance with Adderall 5 mg twice daily in hopes that her pharmacy have back in stock.  She feels the Effexor  to 225 mg is still not working  as well and she is feeling extra irritated over the last few months. She is finding her self tearful and overreacting to situations Prior note Pt reports he was provided with amphetamine  salts 10 mg for replacement of her Adderall 10 mg secondary to factory backorder.  Patient reports she did not feel it worked very well for her.  She even took a second dose and still did not feel it worked as well.  Typically she is prescribed Adderall 10 mg, half a tab twice daily.  She is present today to discuss other options. Prior note: Pt presents for an OV to discuss her attention and concentration difficulty. She reports onset as young as first grade. Unfortunately she was never diagnosed at that time. She recently was seen by a psychologist who completed an ADHD evaluation on her and it was positive. She would like to discuss medication start today. ADHD screening tool completed today and  positive  type 2 diabetes mellitus with hyperlipidemia/)/Morbid obesity (HCC) Diabetes now managed by endocrine.  Patient reports compliance with Lipitor, omega-3 and fenofibrate            05/04/2023   10:49 AM 02/09/2023    8:10 AM 08/20/2022    9:00 AM 03/05/2022    1:05 PM 09/17/2021    8:21 AM  Depression screen PHQ 2/9  Decreased Interest 1 2 1 1 1   Down, Depressed, Hopeless 1 2 1 1 1   PHQ - 2 Score 2 4 2 2 2   Altered sleeping 2 2 2 2 2   Tired, decreased energy 3 0 2 1 1   Change in appetite 2 0 1 1 1   Feeling bad or failure about yourself  2 2 1 1 1   Trouble concentrating 1 2 1 1 1   Moving slowly or fidgety/restless 0 0 0 0 1  Suicidal thoughts 0 0 0 0 0  PHQ-9 Score 12 10 9 8 9   Difficult doing work/chores  Very difficult         02/09/2023    8:10 AM 03/05/2022    1:05 PM 09/17/2021    8:21 AM 04/02/2021    8:11 AM  GAD 7 : Generalized Anxiety Score  Nervous, Anxious, on Edge 2 1 1 2   Control/stop worrying 2 1 1 1   Worry too much - different things 2 2 1 1   Trouble relaxing 2 2 1 1   Restless 0 2 1 1   Easily annoyed or irritable 3 1 1 2   Afraid - awful  might happen 0 0 1 0  Total GAD 7 Score 11 9 7 8   Anxiety Difficulty Very difficult      Immunization History  Administered Date(s) Administered   Influenza Inj Mdck Quad Pf 11/18/2017   Influenza,inj,Quad PF,6+ Mos 11/29/2016, 02/02/2019, 09/27/2019, 10/28/2020, 09/17/2021   Influenza-Unspecified 12/01/2015, 11/29/2016, 12/25/2022   Moderna Covid-19 Fall Seasonal Vaccine 7yrs & older 03/05/2022   PFIZER(Purple Top)SARS-COV-2 Vaccination 04/10/2019, 05/01/2019, 11/01/2019, 10/04/2020   Pneumococcal Conjugate-13 07/02/2015   Pneumococcal Polysaccharide-23 06/17/2016   Tdap 04/02/2021   Past Medical History:  Diagnosis Date   Anxiety    COVID-19 07/2018   Depression    Diabetes mellitus without complication (HCC)    Elevated cholesterol    Gallstones    GERD (gastroesophageal reflux disease)    Hematochezia  10/2017   Plan for colonoscopy   Hyperplastic colon polyp 11/2017   IBS (irritable bowel syndrome)    diarrhea predominant   Infertility, female    Obesity    Thyroid  disease 2004   treated for years, stopped 2014 per new MD   Allergies  Allergen Reactions   Tape Other (See Comments)    Adhesive tape causes blisters   Levothyroxine Rash    Generic brand only caused rash   Past Surgical History:  Procedure Laterality Date   CHOLECYSTECTOMY  2006   CYST EXCISION     GLOMUS TUMOR EXCISION  2005, 2012   finger and cheek   Family History  Problem Relation Age of Onset   Diabetes Mother    Depression Mother    Obesity Mother    COPD Father    Heart disease Father    Early death Father    Diabetes Father    Obesity Father    Diabetes Brother    Diabetes Maternal Grandmother    Hearing loss Maternal Grandmother    Dementia Maternal Grandmother    Early death Maternal Grandfather    Cancer Maternal Grandfather    Breast cancer Paternal Grandmother    Cancer Paternal Grandmother    Early death Paternal Grandmother    Heart disease Paternal Grandfather    Breast cancer Paternal Aunt    Heart disease Paternal Uncle    Heart attack Paternal Uncle    Breast cancer Paternal Aunt    Alcohol abuse Paternal Aunt    Alzheimer's disease Paternal Aunt    Alcohol abuse Paternal Aunt    Cancer Maternal Aunt    Obesity Maternal Aunt    Colon cancer Neg Hx    Esophageal cancer Neg Hx    Rectal cancer Neg Hx    Stomach cancer Neg Hx    Social History   Social History Narrative   Married. Husband's name is Melissa Gray. No children.   2 pets.   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   Drinks caffeine   Wears her seatbelt   Smoke detector in home   Safe in her relationship.    Allergies as of 08/09/2023       Reactions   Tape Other (See Comments)   Adhesive tape causes blisters   Levothyroxine Rash   Generic brand only caused rash        Medication List         Accurate as of August 09, 2023 12:11 PM. If you have any questions, ask your nurse or doctor.          STOP taking these medications    FLUoxetine  20 MG capsule Commonly known as:  PROZAC  Stopped by: Lela Fendt       TAKE these medications    amphetamine -dextroamphetamine  10 MG tablet Commonly known as: Adderall 1/2 tab BID What changed: Another medication with the same name was changed. Make sure you understand how and when to take each. Changed by: Charlies Bellini   amphetamine -dextroamphetamine  10 MG tablet Commonly known as: Adderall Half of a tab twice daily Start taking on: October 23, 2023 What changed: These instructions start on October 23, 2023. If you are unsure what to do until then, ask your doctor or other care provider. Changed by: Charlies Bellini   ARIPiprazole  2 MG tablet Commonly known as: Abilify  Take 1 tablet (2 mg total) by mouth daily. Started by: Charlies Bellini   atorvastatin  40 MG tablet Commonly known as: LIPITOR Take 1 tablet (40 mg total) by mouth daily.   Dexcom G7 Sensor Misc USE TO CHECK GLUCOSE CONTINUOUSLY, CHANGE SENSOR EVERY 10 DAYS   Dexcom G7 Sensor Misc Use to check glucose continuously, change sensor every 10 days   Farxiga  10 MG Tabs tablet Generic drug: dapagliflozin  propanediol TAKE 1 TABLET BY MOUTH DAILY BEFORE BREAKFAST.   fenofibrate  micronized 134 MG capsule Commonly known as: LOFIBRA Take 1 capsule (134 mg total) by mouth daily.   FISH OIL PO Take 3,000 mg by mouth.   glimepiride  4 MG tablet Commonly known as: AMARYL  Take 1 tablet (4 mg total) by mouth daily before breakfast.   Insulin  Pen Needle 32G X 4 MM Misc Use 1x a day   MULTIVITAMIN ADULT PO Take by mouth.   Ozempic  (1 MG/DOSE) 4 MG/3ML Sopn Generic drug: Semaglutide  (1 MG/DOSE) Inject 1 mg into the skin once a week.   Tresiba  FlexTouch 200 UNIT/ML FlexTouch Pen Generic drug: insulin  degludec Inject 20 Units into the skin daily.    venlafaxine  XR 75 MG 24 hr capsule Commonly known as: Effexor  XR Take 3 capsules (225 mg total) by mouth daily with breakfast.   Vitamin D  (Ergocalciferol ) 1.25 MG (50000 UNIT) Caps capsule Commonly known as: DRISDOL  Take 1 capsule (50,000 Units total) by mouth every 7 (seven) days.        All past medical history, surgical history, allergies, family history, immunizations andmedications were updated in the EMR today and reviewed under the history and medication portions of their EMR.     Recent Results (from the past 2160 hours)  POCT glycosylated hemoglobin (Hb A1C)     Status: Abnormal   Collection Time: 08/09/23  9:47 AM  Result Value Ref Range   Hemoglobin A1C 13.3 (A) 4.0 - 5.6 %   HbA1c POC (<> result, manual entry)     HbA1c, POC (prediabetic range)     HbA1c, POC (controlled diabetic range)       ROS 14 pt review of systems performed and negative (unless mentioned in an HPI)  Objective: BP 128/83   Pulse 94   Temp 98.1 F (36.7 C)   Wt 205 lb 6.4 oz (93.2 kg)   SpO2 100%   BMI 33.92 kg/m  Physical Exam Vitals and nursing note reviewed.  Constitutional:      General: She is not in acute distress.    Appearance: Normal appearance. She is not ill-appearing, toxic-appearing or diaphoretic.  HENT:     Head: Normocephalic and atraumatic.  Eyes:     General: No scleral icterus.       Right eye: No discharge.        Left eye: No discharge.  Extraocular Movements: Extraocular movements intact.     Conjunctiva/sclera: Conjunctivae normal.     Pupils: Pupils are equal, round, and reactive to light.  Cardiovascular:     Rate and Rhythm: Normal rate and regular rhythm.  Pulmonary:     Effort: Pulmonary effort is normal. No respiratory distress.     Breath sounds: Normal breath sounds. No wheezing, rhonchi or rales.  Musculoskeletal:     Right lower leg: No edema.     Left lower leg: No edema.  Skin:    General: Skin is warm.     Findings: No rash.   Neurological:     Mental Status: She is alert and oriented to person, place, and time. Mental status is at baseline.     Motor: No weakness.     Gait: Gait normal.  Psychiatric:        Mood and Affect: Mood normal.        Behavior: Behavior normal.        Thought Content: Thought content normal.        Judgment: Judgment normal.     Diabetic Foot Exam - Simple   No data filed      No results found.  Assessment/plan: Rasa Bencivenga is a 48 y.o. female present for chronic condition appointment Type 2 diabetes mellitus with hyperlipidemia (HCC)/4Morbid obesity (HCC) Diabetes managed by endocrine microalbumin collected 02/09/2023- urine collected Foot exam completed 02/09/2023 Eye exam UTD 11/2022-records requested Continue atorvastatin  40 mg daily Continue  fenofibrate  134 mg daily  Attention and concentration deficit/Depression with anxiety *** continue Adderall 5 mg twice daily.  Continue  Effexor  225 mg daily Start fluoxetine  20 mg qd [place]  controlled substance database was reviewed 08/09/2023 Adderall 10 mg-half tab twice daily, # 90 x 2 -has been Referred to therapist      Return in about 24 weeks (around 01/24/2024) for Routine chronic condition follow-up.   No orders of the defined types were placed in this encounter.  Meds ordered this encounter  Medications   amphetamine -dextroamphetamine  (ADDERALL) 10 MG tablet    Sig: 1/2 tab BID    Dispense:  90 tablet    Refill:  0   amphetamine -dextroamphetamine  (ADDERALL) 10 MG tablet    Sig: Half of a tab twice daily    Dispense:  90 tablet    Refill:  0   Vitamin D , Ergocalciferol , (DRISDOL ) 1.25 MG (50000 UNIT) CAPS capsule    Sig: Take 1 capsule (50,000 Units total) by mouth every 7 (seven) days.    Dispense:  12 capsule    Refill:  3   venlafaxine  XR (EFFEXOR  XR) 75 MG 24 hr capsule    Sig: Take 3 capsules (225 mg total) by mouth daily with breakfast.    Dispense:  270 capsule    Refill:  1    ARIPiprazole  (ABILIFY ) 2 MG tablet    Sig: Take 1 tablet (2 mg total) by mouth daily.    Dispense:  90 tablet    Refill:  1   Referral Orders  No referral(s) requested today    Electronically signed by: Charlies Bellini, DO [facility] [place]

## 2023-08-09 NOTE — Progress Notes (Signed)
 Patient ID: Melissa Gray, female   DOB: 1975-12-01, 48 y.o.   MRN: [id number]   HPI: Melissa Gray is a 48 y.o.-year-old female, initially referred by her PCP, Dr. Catherine, returning for f/u for DM2, dx in 2016, non-insulin -dependent, uncontrolled, with complications (DR).  Last visit 3 months ago, previous visit 2 years and 3 months prior. She is here with her husband.  Interim hx: No nausea, chest pain, blurry vision, + increased urination - chronic. She reports increased depression and anxiety.  She works from home and she has problems leaving the house. She did not start the insulin  and Ozempic  as suggested at last visit.  She does not have a good reason for this other than being depressed.  She tried several counselors in the past but they were not a good fit.  Her PCP is treating her for depression.  DM2: Reviewed HbA1c: Lab Results  Component Value Date   HGBA1C 13.6 (A) 04/29/2023   HGBA1C 13.6 04/29/2023   HGBA1C 13.8 (H) 02/09/2023   HGBA1C 9.5 (H) 03/05/2022   HGBA1C 8.8 (A) 02/03/2021   HGBA1C 7.7 (A) 09/18/2020   HGBA1C 6.6 (A) 06/04/2020   HGBA1C 9.1 (A) 03/12/2020   HGBA1C 9.1 03/12/2020   HGBA1C 9.1 (A) 03/12/2020   HGBA1C 9.1 (A) 03/12/2020   At last visit, she was off: - Glimepiride  8 mg in a.m. before breakfast  - Ozempic  0.5 >> 1 mg weekly  - Farxiga  10 mg in the am before b'fast Tried Victoza  >> pain at injection site - several years ago. She was previously on Rybelsus , but was not taking it consistently. We stopped Januvia  when we switched to Ozempic  03/2020. Please stop colesevelam  twice a day due to increased triglycerides.    I recommended the following regimen: - Glimepiride  4 mg 2x a day before b'fast and dinner - Ozempic  1 mg weekly in a.m. - Tresiba  20 units daily (increase up to 50 units a day if needed)  However, at today's visit, she is on: - Glimepiride  4 mg 2x a day before b'fast and dinner - Farxiga  10 mg before b'fast SHE DID NOT  START: - Ozempic  1 mg weekly in a.m. - Tresiba  20 units daily (increase up to 50 units a day if needed)  She is not checking blood sugars at all.  She did not start the CGM.  Previously:   Previously:   Lowest sugar was 70s >> >80 >> ?; she has hypoglycemia awareness at 70.  Highest sugar was >350 >> ?.  Pt's meals are: (now [employer]) - Breakfast: 2 eggs with veggies or bagel w/ cream cheese or cereal, or skips - Lunch: sandwich - Dinner: takeout 1-2x a week, homecooked: meat + veggies or salad + starch - Snacks: 1-2: icecream - maybe once a week (decreased cravings with Adderrall) She previously saw nutrition.  - no CKD, last BUN/creatinine:  Lab Results  Component Value Date   BUN 13 02/09/2023   BUN 12 03/05/2022   CREATININE 0.74 02/09/2023   CREATININE 0.75 03/05/2022   No results found for: MICRALBCREAT She is not on ARB or ACE inhibitor.  -+ HL; last set of lipids: Lab Results  Component Value Date   CHOL 164 02/09/2023   HDL 27.80 (L) 02/09/2023   LDLCALC 53 10/28/2020   LDLDIRECT 60.0 02/09/2023   TRIG (H) 02/09/2023    610.0 Triglyceride is over 400; calculations on Lipids are invalid.   CHOLHDL 6 02/09/2023  On Lipitor 40 mg daily, micronized fenofibrate   134 mg daily, fish oil.  We stopped colesevelam  05/2020.  - last eye exam was on 03/10/2023: + DR   - no numbness and tingling in her feet.  Last foot exam 02/09/2023.  Pt has FH of DM in mother, father, maternal grandmother, brother.  She also has a history of hypothyroidism:  Patient describes that she was previously on Synthroid for several years.  She could not tolerate generic levothyroxine due to facial rash and tingling.  However, her levothyroxine requirements decreased and she was able to come off.  She was off the hormone in 11/2019 when TSH was slightly high.  Repeat TSH 3 months later was still slightly high.  At that time, she was started on liothyronine  by PCP (10 mcg).  We stopped this  in 02/2020.  She did not feel differently afterwards.   TFTs remained normal - until 01/2023: Lab Results  Component Value Date   TSH 5.54 (H) 02/09/2023   TSH 3.32 03/05/2022   TSH 3.63 04/02/2021   TSH 2.79 10/28/2020   TSH 3.04 06/04/2020   TSH 4.57 (H) 03/12/2020   TSH 4.95 (H) 12/12/2019   TSH 2.25 02/02/2019   TSH 3.64 06/30/2018   TSH 2.79 08/31/2017   No results found for: THGAB   Pt denies: - feeling nodules in neck - hoarseness - dysphagia - choking  She also has a history of GERD, IBS, obesity, gallstones - status post cholecystectomy, depression.  ROS: + see HPI  Past Medical History:  Diagnosis Date   Anxiety    COVID-19 07/2018   Depression    Diabetes mellitus without complication (HCC)    Elevated cholesterol    Gallstones    GERD (gastroesophageal reflux disease)    Hematochezia 10/2017   Plan for colonoscopy   Hyperplastic colon polyp 11/2017   IBS (irritable bowel syndrome)    diarrhea predominant   Infertility, female    Obesity    Thyroid  disease 2004   treated for years, stopped 2014 per new MD   Past Surgical History:  Procedure Laterality Date   CHOLECYSTECTOMY  2006   CYST EXCISION     GLOMUS TUMOR EXCISION  2005, 2012   finger and cheek   Social History   Socioeconomic History   Marital status: Married    Spouse name: Not on file   Number of children: 0   Years of education: Not on file   Highest education level: Master's degree (e.g., MA, MS, MEng, MEd, MSW, MBA)  Occupational History   Occupation: Runner, broadcasting/film/video  Tobacco Use   Smoking status: Never   Smokeless tobacco: Never   Tobacco comments:    My dad smoked like a chimney when I was growing up  Vaping Use   Vaping status: Never Used  Substance and Sexual Activity   Alcohol use: Yes    Alcohol/week: 0.0 standard drinks of alcohol    Comment: Maybe once a month or less; only socially   Drug use: Not Currently    Types: Marijuana   Sexual activity: Yes     Partners: Male    Birth control/protection: None  Other Topics Concern   Not on file  Social History Narrative   Married. Husband's name is Ludie. No children.   2 pets.   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   Drinks caffeine   Wears her seatbelt   Smoke detector in home   Safe in her relationship.   Social Drivers of Dispensing optician  Resource Strain: Low Risk  (07/07/2021)   Overall Financial Resource Strain (CARDIA)    Difficulty of Paying Living Expenses: Not very hard  Food Insecurity: No Food Insecurity (07/07/2021)   Hunger Vital Sign    Worried About Running Out of Food in the Last Year: Never true    Ran Out of Food in the Last Year: Never true  Transportation Needs: No Transportation Needs (07/07/2021)   PRAPARE - Administrator, Civil Service (Medical): No    Lack of Transportation (Non-Medical): No  Physical Activity: Unknown (07/07/2021)   Exercise Vital Sign    Days of Exercise per Week: Patient declined    Minutes of Exercise per Session: Not on file  Stress: Stress Concern Present (07/07/2021)   [employer] of Occupational Health - Occupational Stress Questionnaire    Feeling of Stress : To some extent  Social Connections: Moderately Isolated (07/07/2021)   Social Connection and Isolation Panel    Frequency of Communication with Friends and Family: Once a week    Frequency of Social Gatherings with Friends and Family: Once a week    Attends Religious Services: Never    Database administrator or Organizations: Yes    Attends Engineer, structural: More than 4 times per year    Marital Status: Married  Catering manager Violence: Not on file   Current Outpatient Medications on File Prior to Visit  Medication Sig Dispense Refill   amphetamine -dextroamphetamine  (ADDERALL) 10 MG tablet 1/2 tab BID 90 tablet 0   amphetamine -dextroamphetamine  (ADDERALL) 10 MG tablet Half of a tab twice daily 90 tablet 0   atorvastatin   (LIPITOR) 40 MG tablet Take 1 tablet (40 mg total) by mouth daily. 90 tablet 3   Continuous Glucose Sensor (DEXCOM G7 SENSOR) MISC USE TO CHECK GLUCOSE CONTINUOUSLY, CHANGE SENSOR EVERY 10 DAYS 9 each 3   Continuous Glucose Sensor (DEXCOM G7 SENSOR) MISC Use to check glucose continuously, change sensor every 10 days 9 each 3   FARXIGA  10 MG TABS tablet TAKE 1 TABLET BY MOUTH DAILY BEFORE BREAKFAST. 90 tablet 0   fenofibrate  micronized (LOFIBRA) 134 MG capsule Take 1 capsule (134 mg total) by mouth daily. 90 capsule 3   FLUoxetine  (PROZAC ) 20 MG capsule Take 1 capsule (20 mg total) by mouth daily. (Patient not taking: Reported on 06/16/2023) 90 capsule 1   glimepiride  (AMARYL ) 4 MG tablet Take 1 tablet (4 mg total) by mouth daily before breakfast. 180 tablet 3   insulin  degludec (TRESIBA  FLEXTOUCH) 200 UNIT/ML FlexTouch Pen Inject 20 Units into the skin daily. 18 mL 3   Insulin  Pen Needle 32G X 4 MM MISC Use 1x a day 100 each 3   Multiple Vitamin (MULTIVITAMIN ADULT PO) Take by mouth.     Omega-3 Fatty Acids (FISH OIL PO) Take 3,000 mg by mouth.     Semaglutide , 1 MG/DOSE, (OZEMPIC , 1 MG/DOSE,) 4 MG/3ML SOPN Inject 1 mg into the skin once a week. 9 mL 3   venlafaxine  XR (EFFEXOR  XR) 75 MG 24 hr capsule Take 3 capsules (225 mg total) by mouth daily with breakfast. 90 capsule 0   Vitamin D , Ergocalciferol , (DRISDOL ) 1.25 MG (50000 UNIT) CAPS capsule Take 1 capsule (50,000 Units total) by mouth every 7 (seven) days. 12 capsule 3   No current facility-administered medications on file prior to visit.   Allergies  Allergen Reactions   Tape Other (See Comments)    Adhesive tape causes blisters   Levothyroxine Rash  Generic brand only caused rash   Family History  Problem Relation Age of Onset   Diabetes Mother    Depression Mother    Obesity Mother    COPD Father    Heart disease Father    Early death Father    Diabetes Father    Obesity Father    Diabetes Brother    Diabetes Maternal  Grandmother    Hearing loss Maternal Grandmother    Dementia Maternal Grandmother    Early death Maternal Grandfather    Cancer Maternal Grandfather    Breast cancer Paternal Grandmother    Cancer Paternal Grandmother    Early death Paternal Grandmother    Heart disease Paternal Grandfather    Breast cancer Paternal Aunt    Heart disease Paternal Uncle    Heart attack Paternal Uncle    Breast cancer Paternal Aunt    Alcohol abuse Paternal Aunt    Alzheimer's disease Paternal Aunt    Alcohol abuse Paternal Aunt    Cancer Maternal Aunt    Obesity Maternal Aunt    Colon cancer Neg Hx    Esophageal cancer Neg Hx    Rectal cancer Neg Hx    Stomach cancer Neg Hx    PE: BP 120/80   Pulse 92   Ht 5' 5.25 (1.657 m)   Wt 202 lb 9.6 oz (91.9 kg)   SpO2 95%   BMI 33.46 kg/m  Wt Readings from Last 3 Encounters:  08/09/23 202 lb 9.6 oz (91.9 kg)  05/04/23 195 lb (88.5 kg)  04/29/23 194 lb 12.8 oz (88.4 kg)   Constitutional: overweight, in distress-crying at today's appointment Eyes: EOMI, no exophthalmos ENT: moist mucous membranes, no thyromegaly, no cervical lymphadenopathy Cardiovascular: Tachycardia, RR, No MRG Musculoskeletal: no deformities, Skin:  no rashes Neurological: no tremor with outstretched hands  ASSESSMENT: 1. 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  2.  Hypothyroidism  3. HL  PLAN:  1. Patient with longstanding, uncontrolled, type 2 diabetes, insulin -dependent, with noncompliance with regimen and visits.  At last visit, she returned after more than 2 years and her HbA1c was 13.6%.  She was not able to use the CGM as this was not affordable.  She also missed Ozempic  for 1 month and she was not on any diabetic medications at last visit due to lack of refills.  Her CBG in the office was 594. - At last visit we discussed about the importance  of checking blood sugars but also stay compliant with the appointments and the recommended regimen.  I advised her to restart glimepiride , Ozempic , and also Tresiba .  I did not recommend to restart Farxiga  as I was afraid of increased dehydration added on top of her significant glucotoxicity.  I will also send a prescription for a CGM to the pharmacy. - At today's visit, she reports that she is only taking glimepiride  and Farxiga , did not start Ozempic  or Tresiba  due to her depression.  She is crying at today's appointment.  We discussed that we cannot manage her diabetes without at least the insulin .  However, since she has Ozempic  at home, we decided to also try this.  We can continue glimepiride  and Farxiga  for now.  I also recommended to start the CGM. - Unfortunately, if she is not able to follow the recommended plan, I am not able to help her.  She will need treatment for her  depression, since otherwise we will not be able to control her diabetes -I recommended to try to establish care with another counselor.  We discussed about giving her another chance of coming back in a month and a half and starting to check blood sugars and to take the recommended medications.  If she does not come for the appointment or she does not start these, unfortunately, I will not be able to continue to see her. - I suggested to:  Patient Instructions  Please continue: - Glimepiride  4 mg 2x a day before b'fast and dinner - Farxiga  10 mg before b'fast  Please restart: - Ozempic  0.5 mg weekly in a.m. (36 clicks on the 1 mg pen)  Start: - Tresiba  20 units daily  Start the CGM. The code: [id number].  Please return in 1.5 months.  - we checked her HbA1c: 13.3% (very high) - advised to check sugars at different times of the day - 4x a day, rotating check times - advised for yearly eye exams >> she is UTD -Check another ACR today will - return to clinic in 1.5 months   2.  Hypothyroidism -Previously on Synthroid  d.a.w. for several years, after which she was able to come up and TFTs normalized. She was then started on liothyronine  10 mcg daily after her TSH returned slightly high x2 in 02/2020: 4.95 and 4.57, respectively.  She did not feel difference after starting liothyronine  and after discussions about possible side effects of such regimen, we tapered the liothyronine  down.  TFTs remained normal afterwards. - However, she had a slightly high TSH in 01/2023 and she did not return for repeat set of TFTs as advised: Lab Results  Component Value Date   TSH 5.54 (H) 02/09/2023  - We will repeated TFTs at next visit  3. HL - Latest lipid panel from 01/2023 showed elevated triglycerides, low HDL, and LDL at goal: Lab Results  Component Value Date   CHOL 164 02/09/2023   HDL 27.80 (L) 02/09/2023   LDLCALC 53 10/28/2020   LDLDIRECT 60.0 02/09/2023   TRIG (H) 02/09/2023    610.0 Triglyceride is over 400; calculations on Lipids are invalid.   CHOLHDL 6 02/09/2023  - On Lipitor 40 mg daily and fenofibrate  134 mg daily along with fish oil, with no side effects.  We stopped colesevelam  in the past due to high triglycerides - Will recheck a lipid panel at next visit when hopefully her diabetes is better controlled  Lela Fendt, MD PhD [facility]

## 2023-08-09 NOTE — Telephone Encounter (Signed)
 Pt message sent

## 2023-08-09 NOTE — Patient Instructions (Signed)

## 2023-08-09 NOTE — Patient Instructions (Addendum)
 Please continue: - Glimepiride  4 mg 2x a day before b'fast and dinner - Farxiga  10 mg before b'fast  Please restart: - Ozempic  0.5 mg weekly in a.m. (36 clicks on the 1 mg pen)  Start: - Tresiba  20 units daily  Start the CGM. The code: [id number].  Please return in 1.5 months.

## 2023-08-10 ENCOUNTER — Ambulatory Visit: Payer: Self-pay | Admitting: Internal Medicine

## 2023-08-10 LAB — MICROALBUMIN / CREATININE URINE RATIO
Creatinine, Urine: 21 mg/dL (ref 20–275)
Microalb, Ur: <0.2 mg/dL

## 2023-08-15 DIAGNOSIS — E113292 Type 2 diabetes mellitus with mild nonproliferative diabetic retinopathy without macular edema, left eye: Secondary | ICD-10-CM | POA: Diagnosis not present

## 2023-08-15 DIAGNOSIS — E113211 Type 2 diabetes mellitus with mild nonproliferative diabetic retinopathy with macular edema, right eye: Secondary | ICD-10-CM | POA: Diagnosis not present

## 2023-08-15 DIAGNOSIS — H43823 Vitreomacular adhesion, bilateral: Secondary | ICD-10-CM | POA: Diagnosis not present

## 2023-08-15 DIAGNOSIS — H31093 Other chorioretinal scars, bilateral: Secondary | ICD-10-CM | POA: Diagnosis not present

## 2023-08-26 DIAGNOSIS — F411 Generalized anxiety disorder: Secondary | ICD-10-CM | POA: Diagnosis not present

## 2023-08-26 DIAGNOSIS — F900 Attention-deficit hyperactivity disorder, predominantly inattentive type: Secondary | ICD-10-CM | POA: Diagnosis not present

## 2023-08-26 DIAGNOSIS — F332 Major depressive disorder, recurrent severe without psychotic features: Secondary | ICD-10-CM | POA: Diagnosis not present

## 2023-09-08 DIAGNOSIS — F902 Attention-deficit hyperactivity disorder, combined type: Secondary | ICD-10-CM | POA: Diagnosis not present

## 2023-09-08 DIAGNOSIS — F4310 Post-traumatic stress disorder, unspecified: Secondary | ICD-10-CM | POA: Diagnosis not present

## 2023-09-08 DIAGNOSIS — F4381 Prolonged grief disorder: Secondary | ICD-10-CM | POA: Diagnosis not present

## 2023-09-15 DIAGNOSIS — F4381 Prolonged grief disorder: Secondary | ICD-10-CM | POA: Diagnosis not present

## 2023-09-15 DIAGNOSIS — F4310 Post-traumatic stress disorder, unspecified: Secondary | ICD-10-CM | POA: Diagnosis not present

## 2023-09-15 DIAGNOSIS — F902 Attention-deficit hyperactivity disorder, combined type: Secondary | ICD-10-CM | POA: Diagnosis not present

## 2023-09-16 DIAGNOSIS — F332 Major depressive disorder, recurrent severe without psychotic features: Secondary | ICD-10-CM | POA: Diagnosis not present

## 2023-09-16 DIAGNOSIS — F411 Generalized anxiety disorder: Secondary | ICD-10-CM | POA: Diagnosis not present

## 2023-09-16 DIAGNOSIS — F900 Attention-deficit hyperactivity disorder, predominantly inattentive type: Secondary | ICD-10-CM | POA: Diagnosis not present

## 2023-09-21 ENCOUNTER — Telehealth: Payer: Self-pay | Admitting: Gastroenterology

## 2023-09-21 ENCOUNTER — Ambulatory Visit: Admitting: Gastroenterology

## 2023-09-21 NOTE — Telephone Encounter (Signed)
 Good Morning Dr. San,   Patient called stating that she would not be able to make her 8:40 appointment with you this morning due to her not feeling well.   Patient states she will call back at a later time to reschedule.

## 2023-09-22 ENCOUNTER — Ambulatory Visit: Admitting: Internal Medicine

## 2023-09-23 DIAGNOSIS — H40013 Open angle with borderline findings, low risk, bilateral: Secondary | ICD-10-CM | POA: Diagnosis not present

## 2023-09-23 DIAGNOSIS — H25043 Posterior subcapsular polar age-related cataract, bilateral: Secondary | ICD-10-CM | POA: Diagnosis not present

## 2023-09-29 DIAGNOSIS — F902 Attention-deficit hyperactivity disorder, combined type: Secondary | ICD-10-CM | POA: Diagnosis not present

## 2023-09-29 DIAGNOSIS — F3281 Premenstrual dysphoric disorder: Secondary | ICD-10-CM | POA: Diagnosis not present

## 2023-09-29 DIAGNOSIS — F4381 Prolonged grief disorder: Secondary | ICD-10-CM | POA: Diagnosis not present

## 2023-10-04 ENCOUNTER — Ambulatory Visit: Admitting: Internal Medicine

## 2023-10-04 DIAGNOSIS — F902 Attention-deficit hyperactivity disorder, combined type: Secondary | ICD-10-CM | POA: Diagnosis not present

## 2023-10-04 DIAGNOSIS — F4381 Prolonged grief disorder: Secondary | ICD-10-CM | POA: Diagnosis not present

## 2023-10-04 DIAGNOSIS — F3281 Premenstrual dysphoric disorder: Secondary | ICD-10-CM | POA: Diagnosis not present

## 2023-10-20 DIAGNOSIS — F4381 Prolonged grief disorder: Secondary | ICD-10-CM | POA: Diagnosis not present

## 2023-10-20 DIAGNOSIS — F3281 Premenstrual dysphoric disorder: Secondary | ICD-10-CM | POA: Diagnosis not present

## 2023-10-26 DIAGNOSIS — H25041 Posterior subcapsular polar age-related cataract, right eye: Secondary | ICD-10-CM | POA: Diagnosis not present

## 2023-10-26 DIAGNOSIS — H2513 Age-related nuclear cataract, bilateral: Secondary | ICD-10-CM | POA: Diagnosis not present

## 2023-10-26 DIAGNOSIS — H40013 Open angle with borderline findings, low risk, bilateral: Secondary | ICD-10-CM | POA: Diagnosis not present

## 2023-10-26 DIAGNOSIS — Z794 Long term (current) use of insulin: Secondary | ICD-10-CM | POA: Diagnosis not present

## 2023-10-26 DIAGNOSIS — E113293 Type 2 diabetes mellitus with mild nonproliferative diabetic retinopathy without macular edema, bilateral: Secondary | ICD-10-CM | POA: Diagnosis not present

## 2023-10-26 DIAGNOSIS — H2511 Age-related nuclear cataract, right eye: Secondary | ICD-10-CM | POA: Diagnosis not present

## 2023-10-26 DIAGNOSIS — H25043 Posterior subcapsular polar age-related cataract, bilateral: Secondary | ICD-10-CM | POA: Diagnosis not present

## 2023-10-27 DIAGNOSIS — F4381 Prolonged grief disorder: Secondary | ICD-10-CM | POA: Diagnosis not present

## 2023-10-27 DIAGNOSIS — F902 Attention-deficit hyperactivity disorder, combined type: Secondary | ICD-10-CM | POA: Diagnosis not present

## 2023-10-27 DIAGNOSIS — F3281 Premenstrual dysphoric disorder: Secondary | ICD-10-CM | POA: Diagnosis not present

## 2023-11-06 DIAGNOSIS — F418 Other specified anxiety disorders: Secondary | ICD-10-CM | POA: Diagnosis not present

## 2023-11-06 DIAGNOSIS — E1169 Type 2 diabetes mellitus with other specified complication: Secondary | ICD-10-CM | POA: Diagnosis not present

## 2023-11-06 DIAGNOSIS — Z803 Family history of malignant neoplasm of breast: Secondary | ICD-10-CM | POA: Diagnosis not present

## 2023-11-06 DIAGNOSIS — N920 Excessive and frequent menstruation with regular cycle: Secondary | ICD-10-CM | POA: Diagnosis not present

## 2023-11-10 ENCOUNTER — Ambulatory Visit: Admitting: Internal Medicine

## 2023-11-17 DIAGNOSIS — F3281 Premenstrual dysphoric disorder: Secondary | ICD-10-CM | POA: Diagnosis not present

## 2023-11-17 DIAGNOSIS — F902 Attention-deficit hyperactivity disorder, combined type: Secondary | ICD-10-CM | POA: Diagnosis not present

## 2023-11-17 DIAGNOSIS — F4381 Prolonged grief disorder: Secondary | ICD-10-CM | POA: Diagnosis not present

## 2023-11-24 DIAGNOSIS — F3281 Premenstrual dysphoric disorder: Secondary | ICD-10-CM | POA: Diagnosis not present

## 2023-11-24 DIAGNOSIS — F902 Attention-deficit hyperactivity disorder, combined type: Secondary | ICD-10-CM | POA: Diagnosis not present

## 2023-11-24 DIAGNOSIS — F4381 Prolonged grief disorder: Secondary | ICD-10-CM | POA: Diagnosis not present

## 2023-11-30 ENCOUNTER — Other Ambulatory Visit: Payer: Self-pay | Admitting: Medical Genetics

## 2023-11-30 DIAGNOSIS — Z006 Encounter for examination for normal comparison and control in clinical research program: Secondary | ICD-10-CM

## 2023-12-01 DIAGNOSIS — F3281 Premenstrual dysphoric disorder: Secondary | ICD-10-CM | POA: Diagnosis not present

## 2023-12-01 DIAGNOSIS — F902 Attention-deficit hyperactivity disorder, combined type: Secondary | ICD-10-CM | POA: Diagnosis not present

## 2023-12-01 DIAGNOSIS — F4381 Prolonged grief disorder: Secondary | ICD-10-CM | POA: Diagnosis not present

## 2023-12-08 DIAGNOSIS — F902 Attention-deficit hyperactivity disorder, combined type: Secondary | ICD-10-CM | POA: Diagnosis not present

## 2023-12-08 DIAGNOSIS — F4381 Prolonged grief disorder: Secondary | ICD-10-CM | POA: Diagnosis not present

## 2023-12-08 DIAGNOSIS — F3281 Premenstrual dysphoric disorder: Secondary | ICD-10-CM | POA: Diagnosis not present

## 2023-12-27 DIAGNOSIS — H2512 Age-related nuclear cataract, left eye: Secondary | ICD-10-CM | POA: Diagnosis not present

## 2023-12-27 DIAGNOSIS — F418 Other specified anxiety disorders: Secondary | ICD-10-CM | POA: Diagnosis not present

## 2023-12-27 DIAGNOSIS — H2511 Age-related nuclear cataract, right eye: Secondary | ICD-10-CM | POA: Diagnosis not present

## 2023-12-27 DIAGNOSIS — H25042 Posterior subcapsular polar age-related cataract, left eye: Secondary | ICD-10-CM | POA: Diagnosis not present

## 2023-12-29 DIAGNOSIS — F902 Attention-deficit hyperactivity disorder, combined type: Secondary | ICD-10-CM | POA: Diagnosis not present

## 2023-12-29 DIAGNOSIS — F3281 Premenstrual dysphoric disorder: Secondary | ICD-10-CM | POA: Diagnosis not present

## 2023-12-29 DIAGNOSIS — F4381 Prolonged grief disorder: Secondary | ICD-10-CM | POA: Diagnosis not present

## 2024-01-03 DIAGNOSIS — H25012 Cortical age-related cataract, left eye: Secondary | ICD-10-CM | POA: Diagnosis not present

## 2024-01-03 DIAGNOSIS — H2512 Age-related nuclear cataract, left eye: Secondary | ICD-10-CM | POA: Diagnosis not present

## 2024-01-05 DIAGNOSIS — F3281 Premenstrual dysphoric disorder: Secondary | ICD-10-CM | POA: Diagnosis not present

## 2024-01-05 DIAGNOSIS — F4381 Prolonged grief disorder: Secondary | ICD-10-CM | POA: Diagnosis not present

## 2024-01-05 DIAGNOSIS — F902 Attention-deficit hyperactivity disorder, combined type: Secondary | ICD-10-CM | POA: Diagnosis not present

## 2024-01-12 DIAGNOSIS — F3281 Premenstrual dysphoric disorder: Secondary | ICD-10-CM | POA: Diagnosis not present

## 2024-01-12 DIAGNOSIS — F902 Attention-deficit hyperactivity disorder, combined type: Secondary | ICD-10-CM | POA: Diagnosis not present

## 2024-01-12 DIAGNOSIS — F4381 Prolonged grief disorder: Secondary | ICD-10-CM | POA: Diagnosis not present

## 2024-01-21 ENCOUNTER — Other Ambulatory Visit: Payer: Self-pay | Admitting: Family Medicine

## 2024-02-28 ENCOUNTER — Ambulatory Visit: Admitting: Family Medicine

## 2024-02-28 ENCOUNTER — Other Ambulatory Visit: Payer: Self-pay | Admitting: Family Medicine

## 2024-02-28 ENCOUNTER — Encounter: Payer: Self-pay | Admitting: Family Medicine

## 2024-02-28 VITALS — BP 126/76 | HR 91 | Temp 97.9°F | Wt 199.2 lb

## 2024-02-28 DIAGNOSIS — J0140 Acute pansinusitis, unspecified: Secondary | ICD-10-CM

## 2024-02-28 DIAGNOSIS — E1169 Type 2 diabetes mellitus with other specified complication: Secondary | ICD-10-CM

## 2024-02-28 LAB — COMPREHENSIVE METABOLIC PANEL WITH GFR
ALT: 33 U/L (ref 3–35)
AST: 37 U/L (ref 5–37)
Albumin: 4.2 g/dL (ref 3.5–5.2)
Alkaline Phosphatase: 68 U/L (ref 39–117)
BUN: 6 mg/dL (ref 6–23)
CO2: 28 meq/L (ref 19–32)
Calcium: 9.6 mg/dL (ref 8.4–10.5)
Chloride: 99 meq/L (ref 96–112)
Creatinine, Ser: 0.56 mg/dL (ref 0.40–1.20)
GFR: 109.55 mL/min
Glucose, Bld: 329 mg/dL — ABNORMAL HIGH (ref 70–99)
Potassium: 4 meq/L (ref 3.5–5.1)
Sodium: 136 meq/L (ref 135–145)
Total Bilirubin: 0.8 mg/dL (ref 0.2–1.2)
Total Protein: 6.7 g/dL (ref 6.0–8.3)

## 2024-02-28 LAB — MICROALBUMIN / CREATININE URINE RATIO
Creatinine,U: 35.9 mg/dL
Microalb Creat Ratio: UNDETERMINED mg/g (ref 0.0–30.0)
Microalb, Ur: <0.7 mg/dL

## 2024-02-28 LAB — HEMOGLOBIN A1C: Hgb A1c MFr Bld: 10.6 % — ABNORMAL HIGH (ref 4.6–6.5)

## 2024-02-28 MED ORDER — FLUCONAZOLE 150 MG PO TABS: 150.0000 mg | ORAL_TABLET | Freq: Once | ORAL | 0 refills | Status: AC

## 2024-02-28 MED ORDER — AMOXICILLIN 875 MG PO TABS: 875.0000 mg | ORAL_TABLET | Freq: Two times a day (BID) | ORAL | 0 refills | Status: AC

## 2024-02-28 NOTE — Patient Instructions (Addendum)

## 2024-02-28 NOTE — Telephone Encounter (Signed)
 Pt has appt today

## 2024-02-29 ENCOUNTER — Ambulatory Visit: Payer: Self-pay | Admitting: Family Medicine

## 2024-05-14 ENCOUNTER — Ambulatory Visit: Admitting: Obstetrics and Gynecology
